# Patient Record
Sex: Female | Born: 1945 | Race: White | Hispanic: No | Smoking: Current every day smoker
Health system: Southern US, Community
[De-identification: ages and names within clinical notes are randomized; demographics above are authoritative.]

## PROBLEM LIST (undated history)

## (undated) DIAGNOSIS — C801 Malignant (primary) neoplasm, unspecified: Secondary | ICD-10-CM

## (undated) HISTORY — PX: ABDOMINAL HYSTERECTOMY: SHX81

## (undated) HISTORY — PX: BREAST BIOPSY: SHX20

---

## 2011-07-09 DIAGNOSIS — I10 Essential (primary) hypertension: Secondary | ICD-10-CM | POA: Insufficient documentation

## 2011-07-09 DIAGNOSIS — I2510 Atherosclerotic heart disease of native coronary artery without angina pectoris: Secondary | ICD-10-CM | POA: Insufficient documentation

## 2011-10-14 DIAGNOSIS — E785 Hyperlipidemia, unspecified: Secondary | ICD-10-CM | POA: Insufficient documentation

## 2012-09-10 DIAGNOSIS — M2040 Other hammer toe(s) (acquired), unspecified foot: Secondary | ICD-10-CM | POA: Insufficient documentation

## 2012-09-10 DIAGNOSIS — M2010 Hallux valgus (acquired), unspecified foot: Secondary | ICD-10-CM | POA: Insufficient documentation

## 2012-09-10 DIAGNOSIS — M19079 Primary osteoarthritis, unspecified ankle and foot: Secondary | ICD-10-CM | POA: Insufficient documentation

## 2014-03-08 DIAGNOSIS — R3129 Other microscopic hematuria: Secondary | ICD-10-CM | POA: Insufficient documentation

## 2015-01-02 ENCOUNTER — Emergency Department: Payer: Self-pay | Admitting: Emergency Medicine

## 2015-10-18 DIAGNOSIS — R918 Other nonspecific abnormal finding of lung field: Secondary | ICD-10-CM | POA: Insufficient documentation

## 2017-04-15 ENCOUNTER — Other Ambulatory Visit: Payer: Self-pay | Admitting: Family Medicine

## 2017-04-15 DIAGNOSIS — Z1231 Encounter for screening mammogram for malignant neoplasm of breast: Secondary | ICD-10-CM

## 2017-04-23 ENCOUNTER — Other Ambulatory Visit: Payer: Self-pay | Admitting: *Deleted

## 2017-04-23 ENCOUNTER — Encounter: Payer: Self-pay | Admitting: Radiology

## 2017-04-23 ENCOUNTER — Ambulatory Visit
Admission: RE | Admit: 2017-04-23 | Discharge: 2017-04-23 | Disposition: A | Discharge status: Home / Self Care | Payer: Medicare Other | Source: Ambulatory Visit | Attending: Family Medicine | Admitting: Family Medicine

## 2017-04-23 ENCOUNTER — Inpatient Hospital Stay
Admission: RE | Admit: 2017-04-23 | Discharge: 2017-04-23 | Disposition: A | Discharge status: Home / Self Care | Payer: Self-pay | Source: Ambulatory Visit | Attending: *Deleted | Admitting: *Deleted

## 2017-04-23 DIAGNOSIS — Z1231 Encounter for screening mammogram for malignant neoplasm of breast: Secondary | ICD-10-CM | POA: Diagnosis not present

## 2017-04-23 DIAGNOSIS — Z9289 Personal history of other medical treatment: Secondary | ICD-10-CM

## 2017-04-23 HISTORY — DX: Malignant (primary) neoplasm, unspecified: C80.1

## 2018-04-22 ENCOUNTER — Other Ambulatory Visit: Payer: Self-pay | Admitting: Family Medicine

## 2018-04-22 DIAGNOSIS — Z1231 Encounter for screening mammogram for malignant neoplasm of breast: Secondary | ICD-10-CM

## 2018-04-27 ENCOUNTER — Encounter (INDEPENDENT_AMBULATORY_CARE_PROVIDER_SITE_OTHER): Payer: Self-pay

## 2018-04-27 ENCOUNTER — Ambulatory Visit
Admission: RE | Admit: 2018-04-27 | Discharge: 2018-04-27 | Disposition: A | Discharge status: Home / Self Care | Payer: Medicare Other | Source: Ambulatory Visit | Attending: Family Medicine | Admitting: Family Medicine

## 2018-04-27 DIAGNOSIS — Z1231 Encounter for screening mammogram for malignant neoplasm of breast: Secondary | ICD-10-CM | POA: Diagnosis not present

## 2018-08-03 DIAGNOSIS — R7989 Other specified abnormal findings of blood chemistry: Secondary | ICD-10-CM | POA: Insufficient documentation

## 2018-08-11 ENCOUNTER — Other Ambulatory Visit: Payer: Self-pay | Admitting: Unknown Physician Specialty

## 2018-08-11 DIAGNOSIS — M545 Low back pain: Principal | ICD-10-CM

## 2018-08-11 DIAGNOSIS — G8929 Other chronic pain: Secondary | ICD-10-CM

## 2018-08-11 DIAGNOSIS — M5417 Radiculopathy, lumbosacral region: Secondary | ICD-10-CM | POA: Insufficient documentation

## 2018-08-11 DIAGNOSIS — M25552 Pain in left hip: Secondary | ICD-10-CM | POA: Insufficient documentation

## 2018-08-21 ENCOUNTER — Ambulatory Visit: Admission: RE | Admit: 2018-08-21 | Payer: Medicare Other | Source: Ambulatory Visit

## 2018-09-03 ENCOUNTER — Ambulatory Visit
Admission: RE | Admit: 2018-09-03 | Discharge: 2018-09-03 | Disposition: A | Discharge status: Home / Self Care | Payer: Medicare Other | Source: Ambulatory Visit | Attending: Unknown Physician Specialty | Admitting: Unknown Physician Specialty

## 2018-09-03 DIAGNOSIS — M48061 Spinal stenosis, lumbar region without neurogenic claudication: Secondary | ICD-10-CM | POA: Diagnosis not present

## 2018-09-03 DIAGNOSIS — M545 Low back pain: Secondary | ICD-10-CM

## 2018-09-03 DIAGNOSIS — M5126 Other intervertebral disc displacement, lumbar region: Secondary | ICD-10-CM | POA: Insufficient documentation

## 2018-09-03 DIAGNOSIS — M8938 Hypertrophy of bone, other site: Secondary | ICD-10-CM | POA: Insufficient documentation

## 2018-09-03 DIAGNOSIS — M4316 Spondylolisthesis, lumbar region: Secondary | ICD-10-CM | POA: Insufficient documentation

## 2018-09-03 DIAGNOSIS — G8929 Other chronic pain: Secondary | ICD-10-CM | POA: Diagnosis present

## 2018-09-03 DIAGNOSIS — M5417 Radiculopathy, lumbosacral region: Secondary | ICD-10-CM | POA: Diagnosis not present

## 2019-02-03 DIAGNOSIS — E538 Deficiency of other specified B group vitamins: Secondary | ICD-10-CM | POA: Insufficient documentation

## 2019-02-15 ENCOUNTER — Encounter: Payer: Self-pay | Admitting: *Deleted

## 2019-02-15 ENCOUNTER — Telehealth: Payer: Self-pay | Admitting: *Deleted

## 2019-02-15 DIAGNOSIS — Z122 Encounter for screening for malignant neoplasm of respiratory organs: Secondary | ICD-10-CM

## 2019-02-15 DIAGNOSIS — Z87891 Personal history of nicotine dependence: Secondary | ICD-10-CM

## 2019-02-15 NOTE — Telephone Encounter (Signed)
Received referral for initial lung cancer screening scan. Contacted patient and obtained smoking history,(current 55 pack year) as well as answering questions related to screening process. Patient denies signs of lung cancer such as weight loss or hemoptysis. Patient denies comorbidity that would prevent curative treatment if lung cancer were found. Patient is scheduled for shared decision making visit and CT scan on 03/04/19 at 2pm.

## 2019-03-01 ENCOUNTER — Telehealth: Payer: Self-pay | Admitting: *Deleted

## 2019-03-01 NOTE — Telephone Encounter (Signed)
Patient notified/message left to notify patient that due to current restrictions lung screening appointments are cancelled and patient will be contacted regarding rescheduling.  

## 2019-03-04 ENCOUNTER — Ambulatory Visit: Payer: Medicare Other | Admitting: Oncology

## 2019-03-04 ENCOUNTER — Ambulatory Visit: Payer: Medicare Other

## 2019-04-07 DIAGNOSIS — E559 Vitamin D deficiency, unspecified: Secondary | ICD-10-CM | POA: Insufficient documentation

## 2019-04-07 DIAGNOSIS — N1831 Chronic kidney disease, stage 3a: Secondary | ICD-10-CM | POA: Insufficient documentation

## 2019-04-28 ENCOUNTER — Telehealth: Payer: Self-pay | Admitting: *Deleted

## 2019-04-28 NOTE — Telephone Encounter (Signed)
Voicemail left in attempt to reschedule lung screening

## 2019-05-03 DIAGNOSIS — R002 Palpitations: Secondary | ICD-10-CM | POA: Insufficient documentation

## 2019-05-06 ENCOUNTER — Telehealth: Payer: Self-pay | Admitting: *Deleted

## 2019-05-06 DIAGNOSIS — Q254 Congenital malformation of aorta unspecified: Secondary | ICD-10-CM | POA: Insufficient documentation

## 2019-05-06 DIAGNOSIS — I517 Cardiomegaly: Secondary | ICD-10-CM | POA: Insufficient documentation

## 2019-05-06 NOTE — Telephone Encounter (Signed)
Voicemail left in attempt to reschedule lung screening scan.

## 2019-05-28 ENCOUNTER — Telehealth: Payer: Self-pay | Admitting: *Deleted

## 2019-05-28 NOTE — Telephone Encounter (Signed)
Received referral for low dose lung cancer screening CT scan. Message left at phone number listed in EMR for patient to call me back to facilitate scheduling scan.  

## 2019-06-21 ENCOUNTER — Encounter: Payer: Self-pay | Admitting: *Deleted

## 2019-11-25 NOTE — Progress Notes (Signed)
[facility]  [address], [address] [place], [place] [address] Phone: [phone]  Fax: [phone]   Clinic Day:  11/26/2019  Referring physician: Valera Castle, *  Chief Complaint: Morgan Stewart is a 73 y.o. female with thrombocytopenia who is referred in consultation by Dr Valera Castle for assessment and management.   HPI: The patient underwent gastric bypass surgery in 1982.  Surgery was complicated by her wound healing by secondary intention. She described taking months to heal.  She also notes a history of cervical cancer and hysterectomy.  She denies any history of transfusion, hepatitis or exposure to HIV disease.  She was was seen by Dr. Kym Groom on 11/19/2019.  Platelet count was 115,000.  She denies any bruising or bleeding.    CBCs followed: 07/23/2016: Hematocrit 36.0, hemoglobin 12.2, MCV 92, platelets 213,000, WBC 5800, ANC 3300.  10/24/2016: Hematocrit 40.2, hemoglobin 13.6, MCV 90, platelets 144,000, WBC 4500, ANC 2000.  04/30/2017: Hematocrit 41.0, hemoglobin 13.7, MCV 91, platelets 153,000, WBC 5500, ANC 3300.  08/20/2018: Hematocrit 40.6, hemoglobin 13.4, MCV 92, platelets 134,000, WBC 9500, ANC 6200.  B12 was 217. 05/05/2019: Hematocrit 43.5, hemoglobin 14.0, MCV 95, platelets 129,000, WBC 6200, ANC 3600.  B12 was 684. 11/17/2019: Hematocrit 39.2, hemoglobin 12.7, MCV 94, platelets 115,000, WBC 5900, ANC 2800.  B12 was 386.  CMP was normal on 05/05/2019.  TSH was 1.86 on 08/20/2018.  Hepatitis C was negative on 04/30/2017.  She is scheduled for a colonoscopy with Dr. Alice Reichert on 12/23/2019.  Symptomatically, she feels fine.  She denies any concerns or symptoms.  Her sister also had a low platelet count.  Her sister had many health problems; she has required hospitalization.  Her sister has rheumatoid arthritis. She believes her brother may have lupus.  In distant past, she had B12 injections, but is unsure when.  She  takes B12 oral supplement. She eats mainly what she wants.  She eats meats 2-3 times a week. She has cravings for birthday cake ice cream. She denies ice pica or restless legs. She hasn't had any new medication or herbal products in the past 2 years.  She denies drinking any quinine water.   Past Medical History:  Diagnosis Date  . Cancer [facility])    cervical    Past Surgical History:  Procedure Laterality Date  . ABDOMINAL HYSTERECTOMY    . BREAST BIOPSY Left    bx/clip-neg    Family History  Problem Relation Age of Onset  . Breast cancer Sister 73    Social History:  reports that she has been smoking cigarettes. She has a 55.00 pack-year smoking history. She has never used smokeless tobacco. No history on file for alcohol and drug. She has a dog named Mel Almond (border [employer]). She smokes 5-6 cigarettes a day. 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 The patient is alone today.  Allergies:  Allergies  Allergen Reactions  . Ace Inhibitors Hives, Other (See Comments) and Rash    Pt is not sure of reaction  Pt is not sure of reaction    . Doxycycline Other (See Comments)    Pt is not sure of reaction  Pt is not sure of reaction    . Codeine Nausea Only  . Orlistat Rash and Other (See Comments)    Pt is not sure of reaction  Pt is not sure of reaction    . Oxycodone Nausea And Vomiting  . Oxycodone-Acetaminophen Nausea Only  .  Sulfa Antibiotics Other (See Comments) and Rash    Lowering serum sodium levels Lowering serum sodium levels     Current Medications: Current Outpatient Medications  Medication Sig Dispense Refill  . albuterol (VENTOLIN HFA) 108 (90 Base) MCG/ACT inhaler Inhale into the lungs.    Marland Kitchen aspirin 81 MG EC tablet Take by mouth.    . carvedilol (COREG) 6.25 MG tablet Take by mouth.    . Cholecalciferol 50 MCG (2000 UT) CAPS Take by mouth.    . cyanocobalamin 1000 MCG tablet Take by mouth.    . Fluticasone-Salmeterol  (ADVAIR) 250-50 MCG/DOSE AEPB Inhale into the lungs.    . hydrALAZINE (APRESOLINE) 25 MG tablet Take by mouth.    Marland Kitchen ipratropium (ATROVENT) 0.06 % nasal spray Place into the nose.    . losartan (COZAAR) 100 MG tablet Take by mouth.    . Melatonin 1 MG TABS Take by mouth.    . Nebulizers (DEVILBISS DISPOSABLE NEBULIZER) MISC Use nebulizer every 6 hours as needed for wheezing.    . rosuvastatin (CRESTOR) 40 MG tablet Take by mouth.    . traZODone (DESYREL) 50 MG tablet Take 50-100 mg by mouth at bedtime as needed.    . triamcinolone cream (KENALOG) 0.1 % APP EXT AA BID    . venlafaxine XR (EFFEXOR-XR) 150 MG 24 hr capsule Take by mouth.    Marland Kitchen ibuprofen (ADVIL) 600 MG tablet Take by mouth.    . nitroGLYCERIN (NITROSTAT) 0.4 MG SL tablet Place under the tongue.     No current facility-administered medications for this visit.    Review of Systems  Constitutional: Negative.  Negative for chills, diaphoresis, fever, malaise/fatigue and weight loss.       Feels "wonderful".  HENT: Negative.  Negative for congestion, ear pain, nosebleeds, sinus pain and sore throat.   Eyes: Negative.  Negative for blurred vision, double vision and photophobia.  Respiratory: Positive for cough (intermittent; smoker). Negative for hemoptysis, sputum production, shortness of breath and wheezing.   Cardiovascular: Negative.  Negative for chest pain, palpitations, orthopnea, leg swelling and PND.  Gastrointestinal: Positive for constipation (takes Dulcolax). Negative for abdominal pain, blood in stool, diarrhea, melena, nausea and vomiting.  Genitourinary: Positive for frequency. Negative for dysuria, flank pain, hematuria and urgency.  Musculoskeletal: Negative.  Negative for back pain, joint pain, myalgias and neck pain.  Skin: Negative.  Negative for itching and rash.  Neurological: Negative.  Negative for dizziness, tingling, sensory change, speech change, focal weakness, seizures, weakness and headaches.    Endo/Heme/Allergies: Positive for environmental allergies. Bruises/bleeds easily (bruising on hands).  Psychiatric/Behavioral: Negative.  Negative for depression and memory loss. The patient is not nervous/anxious and does not have insomnia.    Performance status (ECOG):  0  Vitals Blood pressure 136/79, pulse 84, temperature 98.5 F (36.9 C), temperature source Oral, weight 177 lb 14.6 oz (80.7 kg), SpO2 98 %.   Physical Exam  Constitutional: She is oriented to person, place, and time. She appears well-developed and well-nourished. No distress.  HENT:  Head: Normocephalic and atraumatic.  Mouth/Throat: Oropharynx is clear and moist. No oropharyngeal exudate.  [employer].  Black mask.  Eyes: Pupils are equal, round, and reactive to light. Conjunctivae and EOM are normal. No scleral icterus.  Brown eyes.  Neck: No JVD present.  Cardiovascular: Normal rate, regular rhythm and normal heart sounds. Exam reveals no gallop and no friction rub.  No murmur heard. Pulmonary/Chest: Effort normal. No respiratory distress. She has wheezes. She has  no rales.  Intermittent expiratory wheezes and squeaks.  Abdominal: Soft. Bowel sounds are normal. She exhibits no distension and no mass. There is no abdominal tenderness. There is no rebound and no guarding.  Musculoskeletal:        General: No tenderness or edema. Normal range of motion.     Cervical back: Normal range of motion and neck supple.  Lymphadenopathy:       Head (right side): No preauricular, no posterior auricular and no occipital adenopathy present.       Head (left side): No preauricular, no posterior auricular and no occipital adenopathy present.    She has no cervical adenopathy.    She has no axillary adenopathy.       Right: No inguinal and no supraclavicular adenopathy present.       Left: No inguinal and no supraclavicular adenopathy present.  Neurological: She is alert and oriented to person, place, and time.  Skin:  Skin is warm and dry. No rash noted. She is not diaphoretic. No erythema. No pallor.  Ecchymosis dorsum of hands.  Psychiatric: She has a normal mood and affect. Her behavior is normal. Judgment and thought content normal.  Nursing note and vitals reviewed.   No visits with results within 3 Day(s) from this visit.  Latest known visit with results is:  No results found for any previous visit.    Assessment:  Asalee Symington is a 73 y.o. 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  Platelet count has ranged between 115,000 - 134,000 since 08/2018.  She denies any new medications or herbal products.  She does not drink quinine water.  She has never had a transfusion.   She is s/p gastric bypass surgery in 1982.  She has B12 deficiency.  B12 was 217 on 08/20/2018 and 386 on 11/17/2019.  She is on monthly B12 injections.  Symptomatically, she feels good.  She denies any fevers, sweats or weight loss.  Exam reveals intermittent wheezes and squeaks.   Plan: 1.   Labs today:  CBC with diff, platelet count in a blue top tube, PT, PTT, ANA with reflex, RF, folate, copper.  2.   Peripheral smear for path review. 3.   Mild thrombocytopenia  Etiology is unclear.  No medication or herbal product implicated.  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  She has a family history of rheumatoid arthritis and lupus. 4.   RTC in 2 weeks for MD assessment, review of work-up and discussion regarding direction of therapy.   I discussed the assessment and treatment plan with the patient.  The patient was provided an opportunity to ask questions and all were answered.  The patient agreed with the plan and demonstrated an understanding of the instructions.  The patient was advised to call back if the symptoms worsen or if the condition fails to improve as anticipated.  I provided 22 minutes (11:20 AM - 11:41 AM) of face-to-face time during this this encounter and > 50% was spent  counseling as documented under my assessment and plan.    Shannan Garfinkel C. Mike Gip, MD, PhD    11/26/2019, 11:41 AM  I, Samul Dada, am acting as scribe for [employer]. Mike Gip, MD, PhD.  I, Dayane Hillenburg C. Mike Gip, MD, have reviewed the above documentation for accuracy and completeness, and I agree with the above.

## 2019-11-26 ENCOUNTER — Other Ambulatory Visit: Payer: Self-pay

## 2019-11-26 ENCOUNTER — Inpatient Hospital Stay: Payer: Medicare Other | Attending: Hematology and Oncology | Admitting: Hematology and Oncology

## 2019-11-26 ENCOUNTER — Inpatient Hospital Stay: Payer: Medicare Other

## 2019-11-26 ENCOUNTER — Encounter: Payer: Self-pay | Admitting: Hematology and Oncology

## 2019-11-26 VITALS — BP 136/79 | HR 84 | Temp 98.5°F | Wt 177.9 lb

## 2019-11-26 DIAGNOSIS — Z90710 Acquired absence of both cervix and uterus: Secondary | ICD-10-CM | POA: Insufficient documentation

## 2019-11-26 DIAGNOSIS — Z791 Long term (current) use of non-steroidal anti-inflammatories (NSAID): Secondary | ICD-10-CM | POA: Diagnosis not present

## 2019-11-26 DIAGNOSIS — Z9884 Bariatric surgery status: Secondary | ICD-10-CM | POA: Diagnosis not present

## 2019-11-26 DIAGNOSIS — Z7951 Long term (current) use of inhaled steroids: Secondary | ICD-10-CM | POA: Insufficient documentation

## 2019-11-26 DIAGNOSIS — M109 Gout, unspecified: Secondary | ICD-10-CM | POA: Insufficient documentation

## 2019-11-26 DIAGNOSIS — F17210 Nicotine dependence, cigarettes, uncomplicated: Secondary | ICD-10-CM | POA: Insufficient documentation

## 2019-11-26 DIAGNOSIS — Z8541 Personal history of malignant neoplasm of cervix uteri: Secondary | ICD-10-CM | POA: Diagnosis not present

## 2019-11-26 DIAGNOSIS — D696 Thrombocytopenia, unspecified: Secondary | ICD-10-CM | POA: Insufficient documentation

## 2019-11-26 DIAGNOSIS — Z7982 Long term (current) use of aspirin: Secondary | ICD-10-CM | POA: Insufficient documentation

## 2019-11-26 DIAGNOSIS — R05 Cough: Secondary | ICD-10-CM | POA: Diagnosis not present

## 2019-11-26 DIAGNOSIS — Z79899 Other long term (current) drug therapy: Secondary | ICD-10-CM | POA: Insufficient documentation

## 2019-11-26 DIAGNOSIS — J449 Chronic obstructive pulmonary disease, unspecified: Secondary | ICD-10-CM | POA: Insufficient documentation

## 2019-11-26 LAB — CBC WITH DIFFERENTIAL/PLATELET
Abs Immature Granulocytes: 0.02 10*3/uL (ref 0.00–0.07)
Basophils Absolute: 0 10*3/uL (ref 0.0–0.1)
Basophils Relative: 1 %
Eosinophils Absolute: 0.2 10*3/uL (ref 0.0–0.5)
Eosinophils Relative: 3 %
HCT: 41.6 % (ref 36.0–46.0)
Hemoglobin: 13.2 g/dL (ref 12.0–15.0)
Immature Granulocytes: 0 %
Lymphocytes Relative: 34 %
Lymphs Abs: 2 10*3/uL (ref 0.7–4.0)
MCH: 30.1 pg (ref 26.0–34.0)
MCHC: 31.7 g/dL (ref 30.0–36.0)
MCV: 95 fL (ref 80.0–100.0)
Monocytes Absolute: 0.4 10*3/uL (ref 0.1–1.0)
Monocytes Relative: 6 %
Neutro Abs: 3.2 10*3/uL (ref 1.7–7.7)
Neutrophils Relative %: 56 %
Platelets: 114 10*3/uL — ABNORMAL LOW (ref 150–400)
RBC: 4.38 MIL/uL (ref 3.87–5.11)
RDW: 14 % (ref 11.5–15.5)
WBC: 5.7 10*3/uL (ref 4.0–10.5)
nRBC: 0 % (ref 0.0–0.2)

## 2019-11-26 LAB — PROTIME-INR
INR: 0.9 (ref 0.8–1.2)
Prothrombin Time: 12.4 seconds (ref 11.4–15.2)

## 2019-11-26 LAB — APTT: aPTT: 33 seconds (ref 24–36)

## 2019-11-26 LAB — FOLATE: Folate: 28 ng/mL (ref >5.9)

## 2019-11-26 LAB — PLATELET BY CITRATE: Platelet CT in Citrate: 106

## 2019-11-27 LAB — RHEUMATOID FACTOR: Rhuematoid fact SerPl-aCnc: <10 IU/mL (ref 0.0–13.9)

## 2019-11-27 LAB — ANA W/REFLEX: Anti Nuclear Antibody (ANA): NEGATIVE

## 2019-11-29 LAB — PATHOLOGIST SMEAR REVIEW

## 2019-12-01 LAB — COPPER, SERUM: Copper: 53 ug/dL — ABNORMAL LOW (ref 72–166)

## 2019-12-14 DIAGNOSIS — E610 Copper deficiency: Secondary | ICD-10-CM | POA: Insufficient documentation

## 2019-12-15 ENCOUNTER — Inpatient Hospital Stay (HOSPITAL_BASED_OUTPATIENT_CLINIC_OR_DEPARTMENT_OTHER): Payer: Medicare Other | Admitting: Hematology and Oncology

## 2019-12-15 ENCOUNTER — Encounter: Payer: Self-pay | Admitting: Hematology and Oncology

## 2019-12-15 DIAGNOSIS — D696 Thrombocytopenia, unspecified: Secondary | ICD-10-CM

## 2019-12-15 DIAGNOSIS — E610 Copper deficiency: Secondary | ICD-10-CM | POA: Diagnosis not present

## 2019-12-15 NOTE — Progress Notes (Signed)
No new changes noted today. The patient Name and DOB has been verified by phone today. 

## 2019-12-15 NOTE — Progress Notes (Signed)
[facility]  [address], [address] [place], [place] [address] Phone: [phone]  Fax: [phone]   Telephone Visit:  12/15/2019  Referring physician: Gayland Curry, MD  I connected with Morgan Stewart on 12/15/19 at 3:04 PM by telephone and verified that I was speaking with the correct person using 2 identifiers.  The patient was at home.  I discussed the limitations, risk, security and privacy concerns of performing an evaluation and management service by telephone and the availability of in person appointments.  I also discussed with the patient that there may be a patient responsible charge related to this service.  The patient expressed understanding and agreed to proceed.   Chief Complaint: Morgan Stewart is a 73 y.o. female with mild thrombocytopenia and B12 deficiency who is seen for review of work up and discussion of direction of therapy  HPI: The patient was last seen in the hematology clinic on 11/26/2019 for initial consultation. At that time, she feels good.  She denies any fevers, sweats or weight loss.  Exam reveals intermittent wheezes and squeaks.   Work up revealed a hematocrit 41.6, hemoglobin 13.2, MCV 95.0, platelets 114,000, WBC 5700 and ANC 3200.  Platelet count in a citrate tube was 106,000.  Folate was 28.0. ANA was negative. Rheumatoid factor was negative.  Copper was 53 (72-166).  PT/INR 12.4/0.9 and APTT 33.   Peripheral smear showed unremarkable WBC, RBC, and platelet morphology.  During the interim, she has been doing well.  She denies any bleeding.  Her hands bruise easily.  She wishes she could lose weight.  She does breathing treatments twice a day and she has albuterol that she uses BID.  She is not receiving B12 injections.  She takes vitamin E 400 mg IU and vitamin C 500 mg.   Past Medical History:  Diagnosis Date  . Cancer [facility])    cervical    Past Surgical History:  Procedure Laterality Date  . ABDOMINAL  HYSTERECTOMY    . BREAST BIOPSY Left    bx/clip-neg    Family History  Problem Relation Age of Onset  . Breast cancer Sister 91    Social History:  reports that she has been smoking cigarettes. She has a 55.00 pack-year smoking history. She has never used smokeless tobacco. No history on file for alcohol and drug.  She has a dog named Mel Almond (border collie cocker mix). She smokes 5-6 cigarettes a day. 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  The patient is alone today.  Participants in the patient's visit and their role in the encounter included the patient, Samul Dada, scribe, and [employer], CMA, today.  The intake visit was provided by Park Meo, and Vito Berger, CMA.   Allergies:  Allergies  Allergen Reactions  . Ace Inhibitors Hives, Other (See Comments) and Rash    Pt is not sure of reaction  Pt is not sure of reaction    . Doxycycline Other (See Comments)    Pt is not sure of reaction  Pt is not sure of reaction    . Codeine Nausea Only  . Orlistat Rash and Other (See Comments)    Pt is not sure of reaction  Pt is not sure of reaction    . Oxycodone Nausea And Vomiting  . Oxycodone-Acetaminophen Nausea Only  . Sulfa Antibiotics Other (See Comments) and Rash    Lowering serum sodium levels Lowering serum sodium levels     Current  Medications: Current Outpatient Medications  Medication Sig Dispense Refill  . aspirin 81 MG EC tablet Take by mouth.    . carvedilol (COREG) 6.25 MG tablet Take by mouth.    . Cholecalciferol 50 MCG (2000 UT) CAPS Take by mouth.    . cyanocobalamin 1000 MCG tablet Take by mouth.    . hydrALAZINE (APRESOLINE) 25 MG tablet Take 50 mg by mouth.     Marland Kitchen ipratropium (ATROVENT) 0.06 % nasal spray Place into the nose.    . losartan (COZAAR) 100 MG tablet Take by mouth.    . Melatonin 1 MG TABS Take by mouth.    . Nebulizers (DEVILBISS DISPOSABLE NEBULIZER) MISC Use nebulizer every 6  hours as needed for wheezing.    . rosuvastatin (CRESTOR) 40 MG tablet Take by mouth.    . traZODone (DESYREL) 50 MG tablet Take 50-100 mg by mouth at bedtime as needed.    . triamcinolone cream (KENALOG) 0.1 % APP EXT AA BID    . venlafaxine XR (EFFEXOR-XR) 150 MG 24 hr capsule Take by mouth.    Marland Kitchen albuterol (VENTOLIN HFA) 108 (90 Base) MCG/ACT inhaler Inhale into the lungs.    . Fluticasone-Salmeterol (ADVAIR) 250-50 MCG/DOSE AEPB Inhale into the lungs.    Marland Kitchen ibuprofen (ADVIL) 600 MG tablet Take by mouth.    . nitroGLYCERIN (NITROSTAT) 0.4 MG SL tablet Place under the tongue.     No current facility-administered medications for this visit.    Review of Systems  Constitutional: Negative.  Negative for chills, diaphoresis, fever, malaise/fatigue and weight loss.       Feels "good".  HENT: Negative.  Negative for congestion, ear pain, nosebleeds, sinus pain and sore throat.   Eyes: Negative.  Negative for blurred vision, double vision and photophobia.  Respiratory: Positive for cough (intermittent; smoker). Negative for hemoptysis, sputum production, shortness of breath and wheezing.        Uses nebulizer BID.  Cardiovascular: Negative.  Negative for chest pain, palpitations, orthopnea, leg swelling and PND.  Gastrointestinal: Positive for constipation (takes Dulcolax). Negative for abdominal pain, blood in stool, diarrhea, melena, nausea and vomiting.  Genitourinary: Positive for frequency. Negative for dysuria, flank pain, hematuria and urgency.  Musculoskeletal: Negative.  Negative for back pain, joint pain, myalgias and neck pain.  Skin: Negative.  Negative for itching and rash.  Neurological: Negative.  Negative for dizziness, tingling, sensory change, speech change, focal weakness, seizures, weakness and headaches.  Endo/Heme/Allergies: Positive for environmental allergies. Bruises/bleeds easily (bruising on hands).  Psychiatric/Behavioral: Negative.  Negative for depression and memory  loss. The patient is not nervous/anxious and does not have insomnia.    Performance status (ECOG): 0  Vitals There were no vitals taken for this visit.   Physical Exam  Constitutional: She is oriented to person, place, and time.  Neurological: She is alert and oriented to person, place, and time.  Psychiatric: She has a normal mood and affect. Her behavior is normal. Judgment and thought content normal.  Nursing note reviewed.   No visits with results within 3 Day(s) from this visit.  Latest known visit with results is:  Office Visit on 11/26/2019  Component Date Value Ref Range Status  . Rhuematoid fact SerPl-aCnc 11/26/2019 <10.0  0.0 - 13.9 IU/mL Final   Comment: (NOTE) Performed At: [facility] [place], [place] [id number] Rush Farmer MD [id number]   . Copper 11/26/2019 53* 72 - 166 ug/dL Final   Comment: (NOTE) This test was developed and its  performance characteristics determined by [facility]. It has not been cleared or approved by the Food and Drug Administration.                                Detection Limit = 5 **Effective December 20, 2019 the reference interval**  for [id number] Copper, Serum will be changing to:                     Age                   Female                  0 -  4 months           71 - 122           5 months -  6 months           36 - 131           7 months - 10 months           40 - 142          11 months -  5 years            28 - 51           6 years  - 10 years            23 - 51          11 years  - 74 years            62 - 74          16 years  - 34 years            2 - 121                    > 30 years            53 - 55                    Age                    Female                  0 -  4 months           38 - 122           5 months -  6 months           55 - 131           7 months - 10 months           64 - 142          11 months -                            5 years            23 - 152           6  years  - 10 years            55 - 89          11 years  - 27 years            29 -  128          16 years  - 18 years            35 - 146                    > 18 years            4 - 59 Performed At: [facility] [place], [place] [id number] Rush Farmer MD [id number]   . Folate 11/26/2019 28.0  >5.9 ng/mL Final   Performed at [facility], [address] [place], [place] [address]  . Anti Nuclear Antibody (ANA) 11/26/2019 Negative  Negative Final   Comment: (NOTE) Performed At: [facility] [place], [place] [id number] Rush Farmer MD [id number]   . aPTT 11/26/2019 33  24 - 36 seconds Final   Performed at [facility], [address] [place], [place] [address]  . Prothrombin Time 11/26/2019 12.4  11.4 - 15.2 seconds Final  . INR 11/26/2019 0.9  0.8 - 1.2 Final   Comment: (NOTE) INR goal varies based on device and disease states. Performed at [facility], [address] [place], [place] [address]   . Platelet CT in [place] 11/26/2019 106   Final   Performed at [facility], [address] [place], [place] [address]  . WBC 11/26/2019 5.7  4.0 - 10.5 K/uL Final  . RBC 11/26/2019 4.38  3.87 - 5.11 MIL/uL Final  . Hemoglobin 11/26/2019 13.2  12.0 - 15.0 g/dL Final  . HCT 11/26/2019 41.6  36.0 - 46.0 % Final  . MCV 11/26/2019 95.0  80.0 - 100.0 fL Final  . MCH 11/26/2019 30.1  26.0 - 34.0 pg Final  . MCHC 11/26/2019 31.7  30.0 - 36.0 g/dL Final  . RDW 11/26/2019 14.0  11.5 - 15.5 % Final  . Platelets 11/26/2019 114* 150 - 400 K/uL Final   Comment: Immature Platelet Fraction may be clinically indicated, consider ordering this additional test [id number]   . nRBC 11/26/2019 0.0  0.0 - 0.2 % Final  . Neutrophils Relative % 11/26/2019 56  % Final  . Neutro Abs 11/26/2019 3.2  1.7 - 7.7 K/uL Final  . Lymphocytes Relative 11/26/2019 34  % Final  . Lymphs Abs 11/26/2019 2.0  0.7 - 4.0  K/uL Final  . Monocytes Relative 11/26/2019 6  % Final  . Monocytes Absolute 11/26/2019 0.4  0.1 - 1.0 K/uL Final  . Eosinophils Relative 11/26/2019 3  % Final  . Eosinophils Absolute 11/26/2019 0.2  0.0 - 0.5 K/uL Final  . Basophils Relative 11/26/2019 1  % Final  . Basophils Absolute 11/26/2019 0.0  0.0 - 0.1 K/uL Final  . Immature Granulocytes 11/26/2019 0  % Final  . Abs Immature Granulocytes 11/26/2019 0.02  0.00 - 0.07 K/uL Final   Performed at [facility], [address] [place], [place] [address]  . Path Review 11/26/2019 Blood smear is reviewed.   Final   Comment: Patient undergoing evaluation for persistent thrombocytopenia. Unremarkable WBC, RBC, and platelet morphology. Reviewed by Dellia Nims Reuel Derby, M.D. Performed at [facility], [address], [place], [place] [address]     Assessment:  Morgan Stewart is a 73 y.o. 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  Platelet count has ranged between 115,000 - 134,000 since 08/2018.  She denies any new medications or herbal products.  She does not drink quinine water.  She has never had a transfusion.  She is s/p gastric bypass surgery in 1982.  She has B12 deficiency.  B12 was 217 on 08/20/2018 and 386 on 11/17/2019.  She is on monthly B12 injections (not currently).  Work up on 11/26/2019 revealed a hematocrit 41.6, hemoglobin 13.2, MCV 95.0, platelets 114,000, WBC 5700 and ANC 3200.  Platelet count in a citrate tube was 106,000.  Folate was 28.0. ANA and rheumatoid factor were negative.  Copper was 53 (72-166).  PT/INR 12.4/0.9 and APTT 33.   Symptomatically, she feels "good".  She denies any bleeding.  Her hands bruise easily.  Platelet count was 114,000.  Plan: 1.   Mild thrombocytopenia             Review work-up.  Etiology unclear, bit possibly related to low copper level.  No evidence of pseudothrombocytopenia as platelet count in a blue top tube was normal.             No medication or herbal product  implicated.             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             Discuss plan to repeat platelet count on normalization of copper level.  Consider future check of hepatitis serologies. 2.   Mild copper deficiency  Copper deficiency typically causes anemia and leukopenia and rarely thrombocytopenia.  Discuss supplementation with MVI with 100% RDA copper. 3.   RTC in 1 month for labs (CBC with diff, B12, copper). 4.   RTC in 3 months for MD assess and labs (CBC with diff +/- others).  I discussed the assessment and treatment plan with the patient.  The patient was provided an opportunity to ask questions and all were answered.  The patient agreed with the plan and demonstrated an understanding of the instructions.  The patient was advised to call back if the symptoms worsen or if the condition fails to improve as anticipated.  I provided 12 minutes (3:02 PM - 3:14 PM) of non face-to-face time during this this encounter and > 50% was spent counseling as documented under my assessment and plan.    Lequita Asal, MD, PhD    12/15/2019, 3:14 PM  I, Samul Dada, am acting as a scribe for Lequita Asal, MD.  I, Prosser Mike Gip, MD, have reviewed the above documentation for accuracy and completeness, and I agree with the above.

## 2019-12-21 ENCOUNTER — Other Ambulatory Visit: Payer: Commercial Managed Care - PPO | Attending: Internal Medicine

## 2019-12-23 ENCOUNTER — Encounter: Admission: RE | Payer: Self-pay | Source: Home / Self Care

## 2019-12-23 ENCOUNTER — Ambulatory Visit
Admission: RE | Admit: 2019-12-23 | Payer: Commercial Managed Care - PPO | Source: Home / Self Care | Admitting: Internal Medicine

## 2019-12-23 SURGERY — COLONOSCOPY WITH PROPOFOL
Anesthesia: General

## 2020-01-03 ENCOUNTER — Telehealth: Payer: Self-pay

## 2020-01-03 NOTE — Telephone Encounter (Signed)
Attempted to return patients call regarding lab results. VM left requesting callback.

## 2020-01-03 NOTE — Telephone Encounter (Signed)
-----   Message from Secundino Ginger sent at 01/03/2020  2:22 PM EST ----- Regarding: LABS THIS PATIENT CALLED AND LEFT A MESSAGE WANTED A CALL BACK ON HER LAB RESULTS.

## 2020-01-17 ENCOUNTER — Inpatient Hospital Stay: Payer: Commercial Managed Care - PPO | Attending: Hematology and Oncology

## 2020-02-10 DIAGNOSIS — Z1211 Encounter for screening for malignant neoplasm of colon: Secondary | ICD-10-CM | POA: Insufficient documentation

## 2020-03-14 ENCOUNTER — Inpatient Hospital Stay: Payer: Commercial Managed Care - PPO

## 2020-03-14 ENCOUNTER — Inpatient Hospital Stay: Payer: Commercial Managed Care - PPO | Admitting: Hematology and Oncology

## 2020-03-20 NOTE — Progress Notes (Signed)
[facility]  [address] [place], [place] [address] Phone: [phone]  Fax: [phone]   Clinic Day:  03/21/2020  Referring physician: Gayland Curry, MD  Chief Complaint: Morgan Stewart is a 74 y.o. female with mild thrombocytopenia and B12 deficiency who is seen for 4 month assessment.   HPI: The patient was last seen in the hematology clinic on 12/15/2019 via telephone. At that time, she felt "good". She denied any bleeding. Her hands bruise easily. Platelet count was 114,000.  Copper was 53 (72-166).  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   During the interim, the patient has felt good. She notes some constipation. She was taking Dulcolax without success so she will start another stool softener OTC. She has a decrease in her appetite. She reports not being active due to COVID-19 and left hip pain (old). Left hip pain increased with mobility and long distance walking. When standing, she leans more to the right side. She denies any bruising or bleeding. She does not drink any quinine water. Denies being on herbal products. Her cough is better. She is using her nebulizer BID. She continues to smoke 8 cigarettes a day. Smoking cessation encouraged. She is taking vitamins daily. I advised her to make sure she is taking copper with the multivitamins.   Patient agreed to come to the clinic for monthly B12 injections. Dr. Kym Groom is her new PCP since Dr. Astrid Divine left. She recently had her last COVID-19 vaccine done at [facility].She agreed to the low dose chest CT lung cancer screening program.    Past Medical History:  Diagnosis Date  . 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 ABDOMINAL HYSTERECTOMY    . BREAST BIOPSY Left    bx/clip-neg    Family History  Problem Relation Age of Onset  . Breast cancer Sister 48    Social History:  reports that she  has been smoking cigarettes. She has a 55.00 pack-year smoking history. She has never used smokeless tobacco. No history on file for alcohol and drug. She has a dog namedBailey(border collie cocker mix). She smokes 5-6 cigarettes a day. She retired from [employer]. She lives home alone. The patient is alone today.  Allergies:  Allergies  Allergen Reactions  . Ace Inhibitors Hives, Other (See Comments) and Rash    Pt is not sure of reaction  Pt is not sure of reaction    . Doxycycline Other (See Comments)    Pt is not sure of reaction  Pt is not sure of reaction    . Codeine Nausea Only  . Orlistat Rash and Other (See Comments)    Pt is not sure of reaction  Pt is not sure of reaction    . Oxycodone Nausea And Vomiting  . Oxycodone-Acetaminophen Nausea Only  . Sulfa Antibiotics Other (See Comments), Rash and Hives    Lowering serum sodium levels Lowering serum sodium levels  Lowering serum sodium levels Lowering serum sodium levels    Current Medications: Current Outpatient Medications  Medication Sig Dispense Refill  . albuterol (VENTOLIN HFA) 108 (90 Base) MCG/ACT inhaler Inhale into the lungs.    Marland Kitchen aspirin 81 MG EC tablet Take by mouth.    . carvedilol (COREG) 6.25 MG tablet Take by mouth.    . Cholecalciferol 50 MCG (2000 UT) CAPS Take by mouth.    . hydrALAZINE (APRESOLINE)  25 MG tablet Take 50 mg by mouth.     Marland Kitchen ibuprofen (ADVIL) 600 MG tablet Take by mouth.    Marland Kitchen ipratropium (ATROVENT) 0.06 % nasal spray Place into the nose.    . losartan (COZAAR) 100 MG tablet Take by mouth.    . Melatonin 1 MG TABS Take by mouth.    . Nebulizers (DEVILBISS DISPOSABLE NEBULIZER) MISC Use nebulizer every 6 hours as needed for wheezing.    . rosuvastatin (CRESTOR) 40 MG tablet Take by mouth.    . traZODone (DESYREL) 50 MG tablet Take 50-100 mg by mouth at bedtime as needed.    . venlafaxine XR (EFFEXOR-XR) 150 MG 24 hr capsule Take by mouth.    .  Fluticasone-Salmeterol (ADVAIR) 250-50 MCG/DOSE AEPB Inhale into the lungs.    . nitroGLYCERIN (NITROSTAT) 0.4 MG SL tablet Place under the tongue.    . triamcinolone cream (KENALOG) 0.1 % APP EXT AA BID     No current facility-administered medications for this visit.    Review of Systems  Constitutional: Negative.  Negative for chills, diaphoresis, fever, malaise/fatigue and weight loss (up 2 lbs).       Feels good.  HENT: Negative.  Negative for congestion, ear pain, nosebleeds, sinus pain and sore throat.   Eyes: Negative.  Negative for blurred vision, double vision and photophobia.  Respiratory: Negative for cough (intermittent; smoker; better), hemoptysis, sputum production, shortness of breath and wheezing.        Uses nebulizer BID.  Cardiovascular: Negative.  Negative for chest pain, palpitations, orthopnea, leg swelling and PND.  Gastrointestinal: Positive for constipation. Negative for abdominal pain, blood in stool, diarrhea, melena, nausea and vomiting.       Decreased appetite.  Genitourinary: Negative for dysuria, flank pain, frequency, hematuria and urgency.  Musculoskeletal: Positive for joint pain (left hip; pain increases with long distance walking). Negative for back pain, myalgias and neck pain.  Skin: Negative.  Negative for itching and rash.  Neurological: Negative.  Negative for dizziness, tingling, sensory change, speech change, focal weakness, seizures, weakness and headaches.  Endo/Heme/Allergies: Negative for environmental allergies. Does not bruise/bleed easily.  Psychiatric/Behavioral: Negative.  Negative for depression and memory loss. The patient is not nervous/anxious and does not have insomnia.   All other systems reviewed and are negative.  Performance status (ECOG): 1-2  Vitals Blood pressure 136/69, pulse 65, temperature (!) 97 F (36.1 C), temperature source Tympanic, resp. rate 18, weight 179 lb 12.8 oz (81.6 kg), SpO2 97 %.   Physical Exam Vitals  and nursing note reviewed.  Constitutional:      General: She is not in acute distress.    Appearance: She is well-developed and well-nourished. She is not diaphoretic.     Comments: Needs minimal assistance on/off exam room table. Ambulating with wheelchair.  HENT:     Head: Normocephalic and atraumatic.     Mouth/Throat:     Mouth: Oropharynx is clear and moist.     Pharynx: No oropharyngeal exudate.      Comments: Curly brown/red/blonde styled hair. Mask. Eyes:     General: No scleral icterus.    Extraocular Movements: EOM normal.     Conjunctiva/sclera: Conjunctivae normal.     Pupils: Pupils are equal, round, and reactive to light.  Cardiovascular:     Rate and Rhythm: Normal rate and regular rhythm.     Heart sounds: Normal heart sounds. No murmur heard.   Pulmonary:     Effort: Pulmonary effort is normal. No  respiratory distress.     Breath sounds: Wheezing present. No rales.  Chest:     Chest wall: No tenderness.  Abdominal:     General: Bowel sounds are normal. There is no distension.     Palpations: Abdomen is soft.     Tenderness: There is no abdominal tenderness. There is no guarding or rebound.  Musculoskeletal:        General: No tenderness or edema. Normal range of motion.     Cervical back: Normal range of motion and neck supple.  Lymphadenopathy:     Head:     Right side of head: No preauricular, posterior auricular or occipital adenopathy.     Left side of head: No preauricular, posterior auricular or occipital adenopathy.     Cervical: No cervical adenopathy.     Upper Body:  No axillary adenopathy present.    Right upper body: No supraclavicular adenopathy.     Left upper body: No supraclavicular adenopathy.     Lower Body: No right inguinal adenopathy. No left inguinal adenopathy.  Skin:    General: Skin is warm and dry.  Neurological:     Mental Status: She is alert and oriented to person, place, and time.  Psychiatric:        Mood and Affect:  Mood and affect normal.        Behavior: Behavior normal.        Thought Content: Thought content normal.        Judgment: Judgment normal.    Appointment on 03/21/2020  Component Date Value Ref Range Status  . WBC 03/21/2020 7.6  4.0 - 10.5 K/uL Final  . RBC 03/21/2020 4.72  3.87 - 5.11 MIL/uL Final  . Hemoglobin 03/21/2020 14.1  12.0 - 15.0 g/dL Final  . HCT 03/21/2020 44.7  36.0 - 46.0 % Final  . MCV 03/21/2020 94.7  80.0 - 100.0 fL Final  . MCH 03/21/2020 29.9  26.0 - 34.0 pg Final  . MCHC 03/21/2020 31.5  30.0 - 36.0 g/dL Final  . RDW 03/21/2020 14.3  11.5 - 15.5 % Final  . Platelets 03/21/2020 110* 150 - 400 K/uL Final   Comment: Immature Platelet Fraction may be clinically indicated, consider ordering this additional test [id number]   . nRBC 03/21/2020 0.0  0.0 - 0.2 % Final  . Neutrophils Relative % 03/21/2020 64  % Final  . Neutro Abs 03/21/2020 4.9  1.7 - 7.7 K/uL Final  . Lymphocytes Relative 03/21/2020 24  % Final  . Lymphs Abs 03/21/2020 1.9  0.7 - 4.0 K/uL Final  . Monocytes Relative 03/21/2020 7  % Final  . Monocytes Absolute 03/21/2020 0.5  0.1 - 1.0 K/uL Final  . Eosinophils Relative 03/21/2020 4  % Final  . Eosinophils Absolute 03/21/2020 0.3  0.0 - 0.5 K/uL Final  . Basophils Relative 03/21/2020 1  % Final  . Basophils Absolute 03/21/2020 0.0  0.0 - 0.1 K/uL Final  . Immature Granulocytes 03/21/2020 0  % Final  . Abs Immature Granulocytes 03/21/2020 0.02  0.00 - 0.07 K/uL Final   Performed at [facility], [address] [place], [place] [address]    Assessment:  Morgan Stewart is a 74 y.o. 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 Platelet count has ranged between 115,000 - 134,000 since 08/2018. She denies any new medications or herbal products. She does not drink quinine water. She has never had a transfusion.   She is s/p gastric bypass surgeryin 1982. She has B12 deficiency. B12  was 217 on 08/20/2018 and 386 on 11/17/2019. She is on  monthly B12 injections (not currently).  Work up on 11/26/2019 revealed a hematocrit 41.6, hemoglobin 13.2, MCV 95.0, platelets 114,000, WBC 5700 and ANC 3200.  Platelet count in a citrate tube was 106,000.  Folate was 28.0. ANA and rheumatoid factor were negative.  Copper was 53 (72-166).  PT/INR 12.4/0.9 and APTT 33.   She recently had her last COVID-19 vaccine.  Symptomatically, she feels good.  She denies any bruising or bleeding.  Exam reveals no adenopathy or hepatosplenomegaly.  Plan: 1.  Labs today: CBC with diff, B12, copper, ferritin. 2.  Mild thrombocytopenia Platelets 110,000.  Etiology is unclear as copper level is now normal.             No evidence of pseudothrombocytopenia as platelet count in a blue top tube was normal. No medication or herbal product implicated. 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  Consider additional work-up if platelet count drops (hepatitis serologies, H. pylori antibody, abdominal ultrasound). 3.Mild copper deficiency             Copper was 53 (low) on 11/26/2019 and 119 (normal) on 03/21/2020.  Copper deficiency typically causes anemia and leukopenia and rarely thrombocytopenia.             She is on a multivitamin with 100% RDA of copper. 4.   B12 deficiency   B12 today is 234.    B12 goal is 400.   5.  Smoking history   Patient has a 55-pack-year smoking history.    Discuss low-dose chest CT program.   Patient is interested. 6.   Status post gastric bypass surgery   Check iron stores. 7.   RN to contact [facility] about low dose chest CT program. 8.   RTC in 6 months for labs (CBC with diff, ferritin, folate). 9.   RTC in 1 year for MD assessment, labs (CBC with diff, ferritin, iron studies).  Addendum: RN to contact patient regarding low B12 level and initiation of oral B12.  I discussed the assessment and treatment plan with the patient.   The patient was provided an opportunity to ask questions and all were answered.  The patient agreed with the plan and demonstrated an understanding of the instructions.  The patient was advised to call back if the symptoms worsen or if the condition fails to improve as anticipated.  I provided 19 minutes of face-to-face time during this encounter and > 50% was spent counseling as documented under my assessment and plan.    Lequita Asal, MD, PhD    03/21/2020, 11:26 AM  I, Selena Batten, am acting as scribe for [employer]. Mike Gip, MD, PhD.  I, Melissa C. Mike Gip, MD, have reviewed the above documentation for accuracy and completeness, and I agree with the above.

## 2020-03-21 ENCOUNTER — Inpatient Hospital Stay: Payer: Commercial Managed Care - PPO | Attending: Hematology and Oncology

## 2020-03-21 ENCOUNTER — Other Ambulatory Visit: Payer: Self-pay

## 2020-03-21 ENCOUNTER — Encounter: Payer: Self-pay | Admitting: Hematology and Oncology

## 2020-03-21 ENCOUNTER — Inpatient Hospital Stay (HOSPITAL_BASED_OUTPATIENT_CLINIC_OR_DEPARTMENT_OTHER): Payer: Commercial Managed Care - PPO | Admitting: Hematology and Oncology

## 2020-03-21 VITALS — BP 136/69 | HR 65 | Temp 97.0°F | Resp 18 | Wt 179.8 lb

## 2020-03-21 DIAGNOSIS — D696 Thrombocytopenia, unspecified: Secondary | ICD-10-CM | POA: Diagnosis present

## 2020-03-21 DIAGNOSIS — Z9884 Bariatric surgery status: Secondary | ICD-10-CM | POA: Diagnosis not present

## 2020-03-21 DIAGNOSIS — E538 Deficiency of other specified B group vitamins: Secondary | ICD-10-CM | POA: Insufficient documentation

## 2020-03-21 DIAGNOSIS — E610 Copper deficiency: Secondary | ICD-10-CM

## 2020-03-21 LAB — CBC WITH DIFFERENTIAL/PLATELET
Abs Immature Granulocytes: 0.02 10*3/uL (ref 0.00–0.07)
Basophils Absolute: 0 10*3/uL (ref 0.0–0.1)
Basophils Relative: 1 %
Eosinophils Absolute: 0.3 10*3/uL (ref 0.0–0.5)
Eosinophils Relative: 4 %
HCT: 44.7 % (ref 36.0–46.0)
Hemoglobin: 14.1 g/dL (ref 12.0–15.0)
Immature Granulocytes: 0 %
Lymphocytes Relative: 24 %
Lymphs Abs: 1.9 10*3/uL (ref 0.7–4.0)
MCH: 29.9 pg (ref 26.0–34.0)
MCHC: 31.5 g/dL (ref 30.0–36.0)
MCV: 94.7 fL (ref 80.0–100.0)
Monocytes Absolute: 0.5 10*3/uL (ref 0.1–1.0)
Monocytes Relative: 7 %
Neutro Abs: 4.9 10*3/uL (ref 1.7–7.7)
Neutrophils Relative %: 64 %
Platelets: 110 10*3/uL — ABNORMAL LOW (ref 150–400)
RBC: 4.72 MIL/uL (ref 3.87–5.11)
RDW: 14.3 % (ref 11.5–15.5)
WBC: 7.6 10*3/uL (ref 4.0–10.5)
nRBC: 0 % (ref 0.0–0.2)

## 2020-03-21 LAB — VITAMIN B12: Vitamin B-12: 234 pg/mL (ref 180–914)

## 2020-03-21 LAB — FERRITIN: Ferritin: 78 ng/mL (ref 11–307)

## 2020-03-21 NOTE — Patient Instructions (Signed)
  Please call back and let us know about your multi-vitamin and if it has copper and how much.

## 2020-03-22 ENCOUNTER — Telehealth: Payer: Self-pay

## 2020-03-22 ENCOUNTER — Other Ambulatory Visit: Payer: Self-pay | Admitting: Hematology and Oncology

## 2020-03-22 NOTE — Telephone Encounter (Signed)
Called patient to discuss B12. Patient did not answer and does not have voicemail set up. Will try back later.

## 2020-03-23 ENCOUNTER — Telehealth: Payer: Self-pay | Admitting: *Deleted

## 2020-03-23 DIAGNOSIS — Z87891 Personal history of nicotine dependence: Secondary | ICD-10-CM

## 2020-03-23 NOTE — Telephone Encounter (Signed)
Received referral for initial lung cancer screening scan. Contacted patient and obtained smoking history,(current, 55 pack year) as well as answering questions related to screening process. Patient denies signs of lung cancer such as weight loss or hemoptysis. Patient denies comorbidity that would prevent curative treatment if lung cancer were found. Patient is scheduled for shared decision making visit and CT scan on 03/29/20 at 1030am.

## 2020-03-24 LAB — COPPER, SERUM: Copper: 119 ug/dL (ref 80–158)

## 2020-03-27 ENCOUNTER — Ambulatory Visit: Payer: Commercial Managed Care - PPO

## 2020-03-29 ENCOUNTER — Inpatient Hospital Stay (HOSPITAL_BASED_OUTPATIENT_CLINIC_OR_DEPARTMENT_OTHER): Payer: Commercial Managed Care - PPO | Admitting: Oncology

## 2020-03-29 ENCOUNTER — Ambulatory Visit: Payer: Medicare HMO | Attending: Oncology

## 2020-03-29 DIAGNOSIS — Z87891 Personal history of nicotine dependence: Secondary | ICD-10-CM

## 2020-03-29 NOTE — Progress Notes (Addendum)
This encounter was created in error - please disregard.

## 2020-03-29 NOTE — Addendum Note (Signed)
Addended by: Faythe Casa E on: 03/29/2020 11:36 AM   Modules accepted: Level of Service, SmartSet

## 2020-04-24 ENCOUNTER — Inpatient Hospital Stay: Payer: Commercial Managed Care - PPO | Attending: Hematology and Oncology

## 2020-05-03 ENCOUNTER — Encounter: Payer: Self-pay | Admitting: *Deleted

## 2020-05-04 ENCOUNTER — Encounter: Payer: Self-pay | Admitting: *Deleted

## 2020-05-04 NOTE — Telephone Encounter (Signed)
Rescheduled for 05/10/20 at 215pm

## 2020-05-10 ENCOUNTER — Ambulatory Visit: Payer: Medicare HMO

## 2020-05-10 ENCOUNTER — Inpatient Hospital Stay: Payer: Commercial Managed Care - PPO | Admitting: Oncology

## 2020-05-22 ENCOUNTER — Inpatient Hospital Stay: Payer: Medicare HMO | Attending: Hematology and Oncology

## 2020-05-22 DIAGNOSIS — E538 Deficiency of other specified B group vitamins: Secondary | ICD-10-CM | POA: Insufficient documentation

## 2020-05-25 ENCOUNTER — Other Ambulatory Visit: Payer: Self-pay

## 2020-05-25 ENCOUNTER — Inpatient Hospital Stay: Payer: Medicare HMO

## 2020-05-25 VITALS — BP 121/81 | HR 68 | Resp 18

## 2020-05-25 DIAGNOSIS — E538 Deficiency of other specified B group vitamins: Secondary | ICD-10-CM

## 2020-05-25 MED ORDER — CYANOCOBALAMIN 1000 MCG/ML IJ SOLN
1000.0000 ug | Freq: Once | INTRAMUSCULAR | Status: AC
  Administered 2020-05-25: 1000 ug via INTRAMUSCULAR
  Filled 2020-05-25: qty 1

## 2020-05-31 ENCOUNTER — Ambulatory Visit: Payer: Medicare HMO

## 2020-05-31 ENCOUNTER — Inpatient Hospital Stay: Payer: Commercial Managed Care - PPO | Admitting: Oncology

## 2020-06-10 DIAGNOSIS — Z9884 Bariatric surgery status: Secondary | ICD-10-CM | POA: Insufficient documentation

## 2020-06-20 ENCOUNTER — Ambulatory Visit
Admission: RE | Admit: 2020-06-20 | Discharge: 2020-06-20 | Disposition: A | Discharge status: Home / Self Care | Payer: Medicare HMO | Source: Ambulatory Visit | Attending: Oncology | Admitting: Oncology

## 2020-06-20 ENCOUNTER — Inpatient Hospital Stay: Payer: Commercial Managed Care - PPO | Attending: Hematology and Oncology

## 2020-06-20 ENCOUNTER — Other Ambulatory Visit: Payer: Self-pay

## 2020-06-20 ENCOUNTER — Inpatient Hospital Stay: Payer: Commercial Managed Care - PPO | Attending: Oncology | Admitting: Hospice and Palliative Medicine

## 2020-06-20 DIAGNOSIS — Z87891 Personal history of nicotine dependence: Secondary | ICD-10-CM | POA: Diagnosis present

## 2020-06-20 DIAGNOSIS — Z122 Encounter for screening for malignant neoplasm of respiratory organs: Secondary | ICD-10-CM

## 2020-06-20 DIAGNOSIS — F17210 Nicotine dependence, cigarettes, uncomplicated: Secondary | ICD-10-CM

## 2020-06-20 NOTE — Progress Notes (Signed)
Virtual Visit via Video Note  I connected with Morgan Stewart on 06/20/20 at  1:15 PM EDT by a video enabled telemedicine application and verified that I am speaking with the correct person using two identifiers.  Location: Patient: [facility] Provider:Clinic    I discussed the limitations of evaluation and management by telemedicine and the availability of in person appointments. The patient expressed understanding and agreed to proceed.  I discussed the assessment and treatment plan with the patient. The patient was provided an opportunity to ask questions and all were answered. The patient agreed with the plan and demonstrated an understanding of the instructions.   The patient was advised to call back or seek an in-person evaluation if the symptoms worsen or if the condition fails to improve as anticipated.   In accordance with CMS guidelines, patient has met eligibility criteria including age, absence of signs or symptoms of lung cancer.  Social History   Tobacco Use  . Smoking status: Current Every Day Smoker    Packs/day: 1.00    Years: 55.00    Pack years: 55.00    Types: Cigarettes  . Smokeless tobacco: Never Used  . Tobacco comment: .5ppd currently  Vaping Use  . Vaping Use: Never used  Substance Use Topics  . Alcohol use: Not on file  . Drug use: Not on file      A shared decision-making session was conducted prior to the performance of CT scan. This includes one or more decision aids, includes benefits and harms of screening, follow-up diagnostic testing, over-diagnosis, false positive rate, and total radiation exposure.   Counseling on the importance of adherence to annual lung cancer LDCT screening, impact of co-morbidities, and ability or willingness to undergo diagnosis and treatment is imperative for compliance of the program.   Counseling on the importance of continued smoking cessation for former smokers; the importance of smoking cessation for current smokers, and  information about tobacco cessation interventions have been given to patient including [place] [facility]uit [facility] and 1800 quit [place] programs.   Written order for lung cancer screening with LDCT has been given to the patient and any and all questions have been answered to the best of my abilities.    Yearly follow up will be coordinated by Shawn Perkins, Thoracic Navigator.  I provided 15 minutes of face-to-face video visit time during this encounter, and > 50% was spent counseling as documented under my assessment & plan.    E , NP  

## 2020-06-21 ENCOUNTER — Telehealth: Payer: Self-pay | Admitting: *Deleted

## 2020-06-21 NOTE — Telephone Encounter (Signed)
Notified patient of LDCT lung cancer screening program results with recommendation for 12 month follow up imaging. Also notified of incidental findings noted below and is encouraged to discuss further with PCP who will receive a copy of this note and/or the CT report. Patient verbalizes understanding.   IMPRESSION: 1. Lung-RADS 2, benign appearance or behavior. Continue annual screening with low-dose chest CT without contrast in 12 months. 2. Bronchial wall thickening with bilateral lower lobe small airway impaction and associated foci of atelectasis. Underlying component of atypical infection not excluded. 3. 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 Attention on follow-up annual lung cancer screening CT recommended. 4. Cholelithiasis. 5. Aortic Atherosclerosis (ICD10-I70.0) and Emphysema (ICD10-J43.9).

## 2020-07-17 ENCOUNTER — Inpatient Hospital Stay: Payer: Commercial Managed Care - PPO | Admitting: Hematology and Oncology

## 2020-07-17 ENCOUNTER — Inpatient Hospital Stay: Payer: Commercial Managed Care - PPO | Attending: Hematology and Oncology

## 2020-07-17 ENCOUNTER — Other Ambulatory Visit: Payer: Self-pay | Admitting: Hematology and Oncology

## 2020-07-17 DIAGNOSIS — Z9884 Bariatric surgery status: Secondary | ICD-10-CM

## 2020-07-17 DIAGNOSIS — D696 Thrombocytopenia, unspecified: Secondary | ICD-10-CM

## 2020-07-17 DIAGNOSIS — E538 Deficiency of other specified B group vitamins: Secondary | ICD-10-CM

## 2020-07-17 DIAGNOSIS — Z87891 Personal history of nicotine dependence: Secondary | ICD-10-CM | POA: Insufficient documentation

## 2020-08-14 ENCOUNTER — Inpatient Hospital Stay: Payer: Commercial Managed Care - PPO

## 2020-09-19 ENCOUNTER — Inpatient Hospital Stay: Payer: Commercial Managed Care - PPO

## 2020-09-20 ENCOUNTER — Other Ambulatory Visit: Payer: Self-pay

## 2020-09-20 ENCOUNTER — Inpatient Hospital Stay: Payer: Medicare HMO | Attending: Hematology and Oncology

## 2020-09-20 ENCOUNTER — Inpatient Hospital Stay: Payer: Medicare HMO

## 2020-09-20 DIAGNOSIS — E610 Copper deficiency: Secondary | ICD-10-CM | POA: Diagnosis not present

## 2020-09-20 DIAGNOSIS — D696 Thrombocytopenia, unspecified: Secondary | ICD-10-CM | POA: Diagnosis not present

## 2020-09-20 DIAGNOSIS — E538 Deficiency of other specified B group vitamins: Secondary | ICD-10-CM

## 2020-09-20 DIAGNOSIS — Z9884 Bariatric surgery status: Secondary | ICD-10-CM | POA: Insufficient documentation

## 2020-09-20 LAB — CBC WITH DIFFERENTIAL/PLATELET
Abs Immature Granulocytes: 0.02 10*3/uL (ref 0.00–0.07)
Basophils Absolute: 0 10*3/uL (ref 0.0–0.1)
Basophils Relative: 1 %
Eosinophils Absolute: 0.2 10*3/uL (ref 0.0–0.5)
Eosinophils Relative: 3 %
HCT: 42.1 % (ref 36.0–46.0)
Hemoglobin: 13.7 g/dL (ref 12.0–15.0)
Immature Granulocytes: 0 %
Lymphocytes Relative: 37 %
Lymphs Abs: 2.2 10*3/uL (ref 0.7–4.0)
MCH: 31.1 pg (ref 26.0–34.0)
MCHC: 32.5 g/dL (ref 30.0–36.0)
MCV: 95.5 fL (ref 80.0–100.0)
Monocytes Absolute: 0.5 10*3/uL (ref 0.1–1.0)
Monocytes Relative: 9 %
Neutro Abs: 3 10*3/uL (ref 1.7–7.7)
Neutrophils Relative %: 50 %
Platelets: 125 10*3/uL — ABNORMAL LOW (ref 150–400)
RBC: 4.41 MIL/uL (ref 3.87–5.11)
RDW: 14.5 % (ref 11.5–15.5)
WBC: 5.9 10*3/uL (ref 4.0–10.5)
nRBC: 0 % (ref 0.0–0.2)

## 2020-09-20 MED ORDER — CYANOCOBALAMIN 1000 MCG/ML IJ SOLN
1000.0000 ug | Freq: Once | INTRAMUSCULAR | Status: AC
  Administered 2020-09-20: 1000 ug via INTRAMUSCULAR
  Filled 2020-09-20: qty 1

## 2020-09-21 LAB — FOLATE: Folate: 33 ng/mL (ref >5.9)

## 2020-09-21 LAB — FERRITIN: Ferritin: 103 ng/mL (ref 11–307)

## 2020-10-18 ENCOUNTER — Inpatient Hospital Stay: Payer: Commercial Managed Care - PPO | Attending: Hematology and Oncology

## 2020-11-15 ENCOUNTER — Inpatient Hospital Stay: Payer: Commercial Managed Care - PPO | Attending: Hematology and Oncology

## 2020-12-13 ENCOUNTER — Inpatient Hospital Stay: Payer: Commercial Managed Care - PPO

## 2021-01-10 ENCOUNTER — Telehealth: Payer: Self-pay | Admitting: Hematology and Oncology

## 2021-01-10 ENCOUNTER — Inpatient Hospital Stay: Payer: Commercial Managed Care - PPO | Attending: Hematology and Oncology

## 2021-01-10 NOTE — Telephone Encounter (Signed)
Spoke to sister about asking patient to call to reschedule her appt.(B-12) that she missed on today. She stated she will have her call back.

## 2021-02-07 ENCOUNTER — Inpatient Hospital Stay: Payer: Commercial Managed Care - PPO | Attending: Hematology and Oncology

## 2021-03-07 ENCOUNTER — Inpatient Hospital Stay: Payer: Commercial Managed Care - PPO | Attending: Hematology and Oncology

## 2021-03-19 NOTE — Progress Notes (Incomplete)
[facility]  [address] [place], [place] [address] Phone: [phone]  Fax: [phone]   Clinic Day:  03/19/2021  Referring physician: Valera Castle, *  Chief Complaint: Morgan Stewart is a 75 y.o. female with mild thrombocytopenia and B12 deficiency who is seen for 1 year assessment.   HPI: The patient was last seen in the hematology clinic on 03/21/2020. At that time, she felt good. She denied any bruising or bleeding. Exam revealed no adenopathy or hepatosplenomegaly. Hematocrit was 44.7, hemoglobin 14.1, platelets 110,000, WBC 7,600. Ferritin was 78. Copper was 119. Vitamin B12 was 234.  Low dose chest CT on 06/20/2020 revealed Lung-RADS 2, benign appearance or behavior. Continue annual screening with low-dose chest CT without contrast in 12 months. There was bronchial wall thickening with bilateral lower lobe small airway impaction and associated foci of atelectasis. Underlying component of atypical infection was not excluded. 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 Attention on follow-up annual lung cancer screening CT was recommended. There was cholelithiasis. There was aortic atherosclerosis and emphysema .  Labs on 09/20/2020 revealed a hematocrit of 42.1, hemoglobin 13.7, platelets 125,000, WBC 5,900. Ferritin was 103. Folate was 33.0.  She received vitamin B12 injections on 05/25/2020 and 09/20/2020. B12 at [facility] on 03/09/2021 was 225.  During the interim, ***   Past Medical History:  Diagnosis Date  . 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 ABDOMINAL HYSTERECTOMY    . BREAST BIOPSY Left    bx/clip-neg    Family History  Problem Relation Age of Onset  . Breast cancer Sister 17    Social History:  reports that she has been smoking cigarettes. She has a 55.00 pack-year smoking history. She has never used smokeless tobacco. No history on file for alcohol use and  drug use. She has a dog namedBailey(border collie cocker mix). She smokes 5-6 cigarettes a day. She retired from [employer]. She lives home alone. The patient is alone*** today.  Allergies:  Allergies  Allergen Reactions  . Ace Inhibitors Hives, Other (See Comments) and Rash    Pt is not sure of reaction  Pt is not sure of reaction    . Doxycycline Other (See Comments)    Pt is not sure of reaction  Pt is not sure of reaction    . Codeine Nausea Only  . Orlistat Rash and Other (See Comments)    Pt is not sure of reaction  Pt is not sure of reaction    . Oxycodone Nausea And Vomiting  . Oxycodone-Acetaminophen Nausea Only  . Sulfa Antibiotics Other (See Comments), Rash and Hives    Lowering serum sodium levels Lowering serum sodium levels  Lowering serum sodium levels Lowering serum sodium levels    Current Medications: Current Outpatient Medications  Medication Sig Dispense Refill  . albuterol (VENTOLIN HFA) 108 (90 Base) MCG/ACT inhaler Inhale into the lungs.    Marland Kitchen aspirin 81 MG EC tablet Take by mouth.    . carvedilol (COREG) 6.25 MG tablet Take by mouth.    . Cholecalciferol 50 MCG (2000 UT) CAPS Take by mouth.    . Fluticasone-Salmeterol (ADVAIR) 250-50 MCG/DOSE AEPB Inhale into the lungs.    . hydrALAZINE (APRESOLINE) 25 MG tablet Take 50 mg by mouth.     Marland Kitchen ibuprofen (ADVIL) 600 MG tablet Take by mouth.    Marland Kitchen ipratropium (ATROVENT) 0.06 % nasal spray Place into  the nose.    . losartan (COZAAR) 100 MG tablet Take by mouth.    . Melatonin 1 MG TABS Take by mouth.    . Nebulizers (DEVILBISS DISPOSABLE NEBULIZER) MISC Use nebulizer every 6 hours as needed for wheezing.    . nitroGLYCERIN (NITROSTAT) 0.4 MG SL tablet Place under the tongue.    . rosuvastatin (CRESTOR) 40 MG tablet Take by mouth.    . traZODone (DESYREL) 50 MG tablet Take 50-100 mg by mouth at bedtime as needed.    . triamcinolone cream (KENALOG) 0.1 % APP EXT AA BID    .  venlafaxine XR (EFFEXOR-XR) 150 MG 24 hr capsule Take by mouth.     No current facility-administered medications for this visit.    Review of Systems  Constitutional: Negative.  Negative for chills, diaphoresis, fever, malaise/fatigue and weight loss (up 2 lbs).       Feels good.  HENT: Negative.  Negative for congestion, ear pain, nosebleeds, sinus pain and sore throat.   Eyes: Negative.  Negative for blurred vision, double vision and photophobia.  Respiratory: Negative for cough (intermittent; smoker; better), hemoptysis, sputum production, shortness of breath and wheezing.        Uses nebulizer BID.  Cardiovascular: Negative.  Negative for chest pain, palpitations, orthopnea, leg swelling and PND.  Gastrointestinal: Positive for constipation. Negative for abdominal pain, blood in stool, diarrhea, melena, nausea and vomiting.       Decreased appetite.  Genitourinary: Negative for dysuria, flank pain, frequency, hematuria and urgency.  Musculoskeletal: Positive for joint pain (left hip; pain increases with long distance walking). Negative for back pain, myalgias and neck pain.  Skin: Negative.  Negative for itching and rash.  Neurological: Negative.  Negative for dizziness, tingling, sensory change, speech change, focal weakness, seizures, weakness and headaches.  Endo/Heme/Allergies: Negative for environmental allergies. Does not bruise/bleed easily.  Psychiatric/Behavioral: Negative.  Negative for depression and memory loss. The patient is not nervous/anxious and does not have insomnia.   All other systems reviewed and are negative.  Performance status (ECOG): 1-2***  Vitals There were no vitals taken for this visit.   Physical Exam Vitals and nursing note reviewed.  Constitutional:      General: She is not in acute distress.    Appearance: She is well-developed. She is not diaphoretic.     Comments: Needs minimal assistance on/off exam room table. Ambulating with wheelchair.   HENT:     Head: Normocephalic and atraumatic.     Mouth/Throat:     Pharynx: No oropharyngeal exudate.  Eyes:     General: No scleral icterus.    Conjunctiva/sclera: Conjunctivae normal.     Pupils: Pupils are equal, round, and reactive to light.  Cardiovascular:     Rate and Rhythm: Normal rate and regular rhythm.     Heart sounds: Normal heart sounds. No murmur heard.   Pulmonary:     Effort: Pulmonary effort is normal. No respiratory distress.     Breath sounds: Wheezing present. No rales.  Chest:     Chest wall: No tenderness.  Breasts:     Right: No supraclavicular adenopathy.     Left: No supraclavicular adenopathy.    Abdominal:     General: Bowel sounds are normal. There is no distension.     Palpations: Abdomen is soft.     Tenderness: There is no abdominal tenderness. There is no guarding or rebound.  Musculoskeletal:        General: No tenderness. Normal range  of motion.     Cervical back: Normal range of motion and neck supple.  Lymphadenopathy:     Head:     Right side of head: No preauricular, posterior auricular or occipital adenopathy.     Left side of head: No preauricular, posterior auricular or occipital adenopathy.     Cervical: No cervical adenopathy.     Upper Body:     Right upper body: No supraclavicular adenopathy.     Left upper body: No supraclavicular adenopathy.     Lower Body: No right inguinal adenopathy. No left inguinal adenopathy.  Skin:    General: Skin is warm and dry.  Neurological:     Mental Status: She is alert and oriented to person, place, and time.  Psychiatric:        Behavior: Behavior normal.        Thought Content: Thought content normal.        Judgment: Judgment normal.    No visits with results within 3 Day(s) from this visit.  Latest known visit with results is:  Appointment on 09/20/2020  Component Date Value Ref Range Status  . Folate 09/20/2020 33.0  >5.9 ng/mL Final   Comment: PLEASE INTERPRET RESULTS WITH  CAUTION TEST RAN OUTSIDE OF SAMPLE STABILITY Performed at [facility], [address] [place], [place] [address]   . WBC 09/20/2020 5.9  4.0 - 10.5 K/uL Final  . RBC 09/20/2020 4.41  3.87 - 5.11 MIL/uL Final  . Hemoglobin 09/20/2020 13.7  12.0 - 15.0 g/dL Final  . HCT 09/20/2020 42.1  36.0 - 46.0 % Final  . MCV 09/20/2020 95.5  80.0 - 100.0 fL Final  . MCH 09/20/2020 31.1  26.0 - 34.0 pg Final  . MCHC 09/20/2020 32.5  30.0 - 36.0 g/dL Final  . RDW 09/20/2020 14.5  11.5 - 15.5 % Final  . Platelets 09/20/2020 125* 150 - 400 K/uL Final  . nRBC 09/20/2020 0.0  0.0 - 0.2 % Final  . Neutrophils Relative % 09/20/2020 50  % Final  . Neutro Abs 09/20/2020 3.0  1.7 - 7.7 K/uL Final  . Lymphocytes Relative 09/20/2020 37  % Final  . Lymphs Abs 09/20/2020 2.2  0.7 - 4.0 K/uL Final  . Monocytes Relative 09/20/2020 9  % Final  . Monocytes Absolute 09/20/2020 0.5  0.1 - 1.0 K/uL Final  . Eosinophils Relative 09/20/2020 3  % Final  . Eosinophils Absolute 09/20/2020 0.2  0.0 - 0.5 K/uL Final  . Basophils Relative 09/20/2020 1  % Final  . Basophils Absolute 09/20/2020 0.0  0.0 - 0.1 K/uL Final  . Immature Granulocytes 09/20/2020 0  % Final  . Abs Immature Granulocytes 09/20/2020 0.02  0.00 - 0.07 K/uL Final   Performed at [facility], [address] [place], [place] [address]  . Ferritin 09/20/2020 103  11 - 307 ng/mL Final   Comment: PLEASE INTERPRET RESULTS WITH CAUTION TEST RAN OUTSIDE OF SAMPLE STABILITY Performed at [facility] [address] [place], [place] [address]     Assessment:  Morgan Stewart is a 75 y.o. 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 Platelet count has ranged between 115,000 - 134,000 since 08/2018. She denies any new medications or herbal products. She does not drink quinine water. She has never had a transfusion.   She is s/p gastric bypass surgeryin 1982. She has B12 deficiency. B12 was 217 on 08/20/2018 and 386 on 11/17/2019.  She is on monthly B12 injections (not currently).  Work up  on 11/26/2019 revealed a hematocrit 41.6, hemoglobin 13.2, MCV 95.0, platelets 114,000, WBC 5700 and ANC 3200.  Platelet count in a citrate tube was 106,000.  Folate was 28.0. ANA and rheumatoid factor were negative.  Copper was 53 (72-166).  PT/INR 12.4/0.9 and APTT 33.   She recently had her last COVID-19 vaccine.  Symptomatically, ***  Plan: 1.  Labs today: CBC with diff, ferritin, iron studies   2.  Mild thrombocytopenia Platelets 110,000.  Etiology is unclear as copper level is now normal.             No evidence of pseudothrombocytopenia as platelet count in a blue top tube was normal. No medication or herbal product implicated. 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  Consider additional work-up if platelet count drops (hepatitis serologies, H. pylori antibody, abdominal ultrasound). 3.Mild copper deficiency             Copper was 53 (low) on 11/26/2019 and 119 (normal) on 03/21/2020.  Copper deficiency typically causes anemia and leukopenia and rarely thrombocytopenia.             She is on a multivitamin with 100% RDA of copper. 4.   B12 deficiency   B12 today is 234.    B12 goal is 400.   5.  Smoking history   Patient has a 55-pack-year smoking history.    Discuss low-dose chest CT program.   Patient is interested. 6.   Status post gastric bypass surgery   Check iron stores. 7.   RN to contact [facility] about low dose chest CT program. 8.   RTC in 6 months for labs (CBC with diff, ferritin, folate). 9.   RTC in 1 year for MD assessment, labs (CBC with diff, ferritin, iron studies).  Addendum: RN to contact patient regarding low B12 level and initiation of oral B12.  I discussed the assessment and treatment plan with the patient.  The patient was provided an opportunity to ask questions and all were answered.  The  patient agreed with the plan and demonstrated an understanding of the instructions.  The patient was advised to call back if the symptoms worsen or if the condition fails to improve as anticipated.  I provided *** minutes of face-to-face time during this encounter and > 50% was spent counseling as documented under my assessment and plan.    Lequita Asal, MD, PhD    03/19/2021, 11:41 AM  I, Mirian Mo Tufford, am acting as Education administrator for [employer]. Mike Gip, MD, PhD.  I, Melissa C. Mike Gip, MD, have reviewed the above documentation for accuracy and completeness, and I agree with the above.

## 2021-03-20 ENCOUNTER — Telehealth: Payer: Self-pay | Admitting: Hematology and Oncology

## 2021-03-20 ENCOUNTER — Inpatient Hospital Stay: Payer: Medicare HMO

## 2021-03-20 ENCOUNTER — Other Ambulatory Visit: Payer: Self-pay

## 2021-03-20 ENCOUNTER — Inpatient Hospital Stay: Payer: Medicare HMO | Attending: Hematology and Oncology | Admitting: Hematology and Oncology

## 2021-03-20 ENCOUNTER — Encounter: Payer: Self-pay | Admitting: Hematology and Oncology

## 2021-03-20 VITALS — BP 134/96 | HR 92 | Temp 98.0°F | Resp 18 | Wt 158.7 lb

## 2021-03-20 DIAGNOSIS — E538 Deficiency of other specified B group vitamins: Secondary | ICD-10-CM

## 2021-03-20 DIAGNOSIS — Z87891 Personal history of nicotine dependence: Secondary | ICD-10-CM

## 2021-03-20 DIAGNOSIS — D696 Thrombocytopenia, unspecified: Secondary | ICD-10-CM | POA: Diagnosis not present

## 2021-03-20 DIAGNOSIS — E610 Copper deficiency: Secondary | ICD-10-CM

## 2021-03-20 DIAGNOSIS — Z9884 Bariatric surgery status: Secondary | ICD-10-CM | POA: Diagnosis not present

## 2021-03-20 LAB — IRON AND TIBC
Iron: 77 ug/dL (ref 28–170)
Saturation Ratios: 27 % (ref 10.4–31.8)
TIBC: 283 ug/dL (ref 250–450)
UIBC: 206 ug/dL

## 2021-03-20 LAB — CBC WITH DIFFERENTIAL/PLATELET
Abs Immature Granulocytes: 0.02 10*3/uL (ref 0.00–0.07)
Basophils Absolute: 0.1 10*3/uL (ref 0.0–0.1)
Basophils Relative: 1 %
Eosinophils Absolute: 0.2 10*3/uL (ref 0.0–0.5)
Eosinophils Relative: 4 %
HCT: 45.5 % (ref 36.0–46.0)
Hemoglobin: 15.3 g/dL — ABNORMAL HIGH (ref 12.0–15.0)
Immature Granulocytes: 0 %
Lymphocytes Relative: 40 %
Lymphs Abs: 2.5 10*3/uL (ref 0.7–4.0)
MCH: 30.3 pg (ref 26.0–34.0)
MCHC: 33.6 g/dL (ref 30.0–36.0)
MCV: 90.1 fL (ref 80.0–100.0)
Monocytes Absolute: 0.4 10*3/uL (ref 0.1–1.0)
Monocytes Relative: 7 %
Neutro Abs: 3.1 10*3/uL (ref 1.7–7.7)
Neutrophils Relative %: 48 %
Platelets: 146 10*3/uL — ABNORMAL LOW (ref 150–400)
RBC: 5.05 MIL/uL (ref 3.87–5.11)
RDW: 14.2 % (ref 11.5–15.5)
WBC: 6.4 10*3/uL (ref 4.0–10.5)
nRBC: 0 % (ref 0.0–0.2)

## 2021-03-20 LAB — FERRITIN: Ferritin: 113 ng/mL (ref 11–307)

## 2021-03-20 MED ORDER — CYANOCOBALAMIN 1000 MCG/ML IJ SOLN
1000.0000 ug | Freq: Once | INTRAMUSCULAR | Status: AC
  Administered 2021-03-20: 1000 ug via INTRAMUSCULAR
  Filled 2021-03-20: qty 1

## 2021-03-20 NOTE — Progress Notes (Signed)
[place]  [address], [address] [place], [place] [address] Phone: [phone]  Fax: [phone]   Clinic Day:  03/20/2021  Referring physician: Valera Castle, *  Chief Complaint: Morgan Stewart is a 75 y.o. female with mild thrombocytopenia and B12 deficiency who is seen for 1 year assessment.   HPI: The patient was last seen in the hematology clinic on 03/21/2020. At that time, she felt good. She denied any bruising or bleeding. Exam revealed no adenopathy or hepatosplenomegaly. Hematocrit was 44.7, hemoglobin 14.1, platelets 110,000, WBC 7,600. Ferritin was 78. Copper was 119. Vitamin B12 was 234.  Low dose chest CT on 06/20/2020 revealed Lung-RADS 2, benign appearance or behavior. Continue annual screening with low-dose chest CT without contrast in 12 months. There was bronchial wall thickening with bilateral lower lobe small airway impaction and associated foci of atelectasis. Underlying component of atypical infection was not excluded. 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 Attention on follow-up annual lung cancer screening CT was recommended. There was cholelithiasis. There was aortic atherosclerosis and emphysema .  Labs on 09/20/2020 revealed a hematocrit of 42.1, hemoglobin 13.7, platelets 125,000, WBC 5,900. Ferritin was 103. Folate was 33.0.  She received vitamin B12 injections on 05/25/2020 and 09/20/2020. B12 at [facility] on 03/09/2021 was 225.  During the interim, she has been "good." She was attacked by her neighbor's dog 3 weeks ago and has a bite on her right calf. She is constipated. She uses her nebulizer BID. She denies bleeding of any kind. Her left pain has resolved.  She struggles with her memory. She is able to cook for herself and do everything she wants to do. She lives alone but her two sisters look after her. She eats whatever she wants. She eats meat and canned vegetables. She does not think she is taking vitamin  B12.   Past Medical History:  Diagnosis Date  . 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 ABDOMINAL HYSTERECTOMY    . BREAST BIOPSY Left    bx/clip-neg    Family History  Problem Relation Age of Onset  . Breast cancer Sister 65    Social History:  reports that she has been smoking cigarettes. She has a 55.00 pack-year smoking history. She has never used smokeless tobacco. No history on file for alcohol use and drug use. She does not drink alcohol. She has a dog namedBailey(border collie cocker mix). She smokes a cigarette about every hour. She retired from [employer]. She lives home alone. The patient is alone today.  Allergies:  Allergies  Allergen Reactions  . Ace Inhibitors Hives, Other (See Comments) and Rash    Pt is not sure of reaction  Pt is not sure of reaction    . Doxycycline Other (See Comments)    Pt is not sure of reaction  Pt is not sure of reaction    . Codeine Nausea Only  . Orlistat Rash and Other (See Comments)    Pt is not sure of reaction  Pt is not sure of reaction    . Oxycodone Nausea And Vomiting  . Oxycodone-Acetaminophen Nausea Only  . Sulfa Antibiotics Other (See Comments), Rash and Hives    Lowering serum sodium levels Lowering serum sodium levels  Lowering serum sodium levels Lowering serum sodium levels    Current Medications: Current Outpatient Medications  Medication Sig Dispense Refill  . albuterol (VENTOLIN HFA) 108 (90  Base) MCG/ACT inhaler Inhale into the lungs.    Marland Kitchen aspirin 81 MG EC tablet Take by mouth.    . Cholecalciferol 50 MCG (2000 UT) CAPS Take by mouth.    . hydrALAZINE (APRESOLINE) 25 MG tablet Take 50 mg by mouth.     Marland Kitchen ibuprofen (ADVIL) 600 MG tablet Take by mouth.    Marland Kitchen ipratropium (ATROVENT) 0.06 % nasal spray Place into the nose.    . losartan (COZAAR) 100 MG tablet Take by mouth.    . Melatonin 1 MG TABS Take by mouth.    . Nebulizers  (DEVILBISS DISPOSABLE NEBULIZER) MISC Use nebulizer every 6 hours as needed for wheezing.    . rosuvastatin (CRESTOR) 40 MG tablet Take by mouth.    . traZODone (DESYREL) 50 MG tablet Take 50-100 mg by mouth at bedtime as needed.    . triamcinolone cream (KENALOG) 0.1 % APP EXT AA BID    . carvedilol (COREG) 6.25 MG tablet Take by mouth.    . Fluticasone-Salmeterol (ADVAIR) 250-50 MCG/DOSE AEPB Inhale into the lungs. (Patient not taking: Reported on 03/20/2021)    . nitroGLYCERIN (NITROSTAT) 0.4 MG SL tablet Place under the tongue. (Patient not taking: Reported on 03/20/2021)    . venlafaxine XR (EFFEXOR-XR) 150 MG 24 hr capsule Take by mouth.     No current facility-administered medications for this visit.    Review of Systems  Constitutional: Positive for weight loss (21 lbs). Negative for chills, diaphoresis, fever and malaise/fatigue.       Feels "good."  HENT: Negative.  Negative for congestion, ear discharge, ear pain, hearing loss, nosebleeds, sinus pain, sore throat and tinnitus.   Eyes: Negative.  Negative for blurred vision, double vision and photophobia.  Respiratory: Negative for cough, hemoptysis, sputum production and shortness of breath.        Uses nebulizer BID.  Cardiovascular: Negative.  Negative for chest pain, palpitations and leg swelling.  Gastrointestinal: Positive for constipation. Negative for abdominal pain, blood in stool, diarrhea, heartburn, melena, nausea and vomiting.       Decreased appetite.  Genitourinary: Negative for dysuria, frequency, hematuria and urgency.  Musculoskeletal: Negative for back pain, joint pain, myalgias and neck pain.  Skin: Negative.  Negative for itching and rash.  Neurological: Negative.  Negative for dizziness, tingling, sensory change, speech change, focal weakness, seizures, weakness and headaches.  Endo/Heme/Allergies: Negative for environmental allergies. Does not bruise/bleed easily.  Psychiatric/Behavioral: Positive for memory  loss. Negative for depression. The patient is not nervous/anxious and does not have insomnia.   All other systems reviewed and are negative.  Performance status (ECOG): 1-2  Vitals Blood pressure (!) 134/96, pulse 92, temperature 98 F (36.7 C), temperature source Tympanic, resp. rate 18, weight 158 lb 11.7 oz (72 kg), SpO2 96 %.   Physical Exam Vitals and nursing note reviewed.  Constitutional:      General: She is not in acute distress.    Appearance: She is well-developed. She is not diaphoretic.     Comments: Needs minimal assistance on/off exam room table. Ambulating with wheelchair.  HENT:     Head: Normocephalic and atraumatic.     Mouth/Throat:     Mouth: Mucous membranes are moist.     Pharynx: Oropharynx is clear. No oropharyngeal exudate.  Eyes:     General: No scleral icterus.    Extraocular Movements: Extraocular movements intact.     Conjunctiva/sclera: Conjunctivae normal.     Pupils: Pupils are equal, round, and reactive to light.  Cardiovascular:     Rate and Rhythm: Normal rate and regular rhythm.     Heart sounds: Normal heart sounds. No murmur heard.   Pulmonary:     Effort: Pulmonary effort is normal. No respiratory distress.     Breath sounds: No rales.  Chest:     Chest wall: No tenderness.  Breasts:     Right: No axillary adenopathy or supraclavicular adenopathy.     Left: No axillary adenopathy or supraclavicular adenopathy.    Abdominal:     General: Bowel sounds are normal. There is no distension.     Palpations: Abdomen is soft. There is no mass.     Tenderness: There is no abdominal tenderness. There is no guarding or rebound.  Musculoskeletal:        General: Tenderness (right calf) present. No swelling. Normal range of motion.     Cervical back: Normal range of motion and neck supple.  Lymphadenopathy:     Head:     Right side of head: No preauricular, posterior auricular or occipital adenopathy.     Left side of head: No preauricular,  posterior auricular or occipital adenopathy.     Cervical: No cervical adenopathy.     Upper Body:     Right upper body: No supraclavicular or axillary adenopathy.     Left upper body: No supraclavicular or axillary adenopathy.     Lower Body: No right inguinal adenopathy. No left inguinal adenopathy.  Skin:    General: Skin is warm and dry.     Comments: Bruise and eschar on back of right calf from dog bite. Not warm. No evidence of infection.  Neurological:     Mental Status: She is alert and oriented to person, place, and time.  Psychiatric:        Behavior: Behavior normal.        Cognition and Memory: She exhibits impaired recent memory.        Judgment: Judgment normal.    Appointment on 03/20/2021  Component Date Value Ref Range Status  . WBC 03/20/2021 6.4  4.0 - 10.5 K/uL Final  . RBC 03/20/2021 5.05  3.87 - 5.11 MIL/uL Final  . Hemoglobin 03/20/2021 15.3* 12.0 - 15.0 g/dL Final  . HCT 03/20/2021 45.5  36.0 - 46.0 % Final  . MCV 03/20/2021 90.1  80.0 - 100.0 fL Final  . MCH 03/20/2021 30.3  26.0 - 34.0 pg Final  . MCHC 03/20/2021 33.6  30.0 - 36.0 g/dL Final  . RDW 03/20/2021 14.2  11.5 - 15.5 % Final  . Platelets 03/20/2021 146* 150 - 400 K/uL Final  . nRBC 03/20/2021 0.0  0.0 - 0.2 % Final  . Neutrophils Relative % 03/20/2021 48  % Final  . Neutro Abs 03/20/2021 3.1  1.7 - 7.7 K/uL Final  . Lymphocytes Relative 03/20/2021 40  % Final  . Lymphs Abs 03/20/2021 2.5  0.7 - 4.0 K/uL Final  . Monocytes Relative 03/20/2021 7  % Final  . Monocytes Absolute 03/20/2021 0.4  0.1 - 1.0 K/uL Final  . Eosinophils Relative 03/20/2021 4  % Final  . Eosinophils Absolute 03/20/2021 0.2  0.0 - 0.5 K/uL Final  . Basophils Relative 03/20/2021 1  % Final  . Basophils Absolute 03/20/2021 0.1  0.0 - 0.1 K/uL Final  . Immature Granulocytes 03/20/2021 0  % Final  . Abs Immature Granulocytes 03/20/2021 0.02  0.00 - 0.07 K/uL Final   Performed at [facility], [address],  [place], [place] [address]    Assessment:  Morgan Stewart is a 75 y.o. 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 Platelet count has ranged between 115,000 - 134,000 since 08/2018. She denies any new medications or herbal products. She does not drink quinine water. She has never had a transfusion.   She is s/p gastric bypass surgeryin 1982. She has B12 deficiency. B12 was 217 on 08/20/2018 and 386 on 11/17/2019. She is on monthly B12 injections (not currently).  Work up on 11/26/2019 revealed a hematocrit 41.6, hemoglobin 13.2, MCV 95.0, platelets 114,000, WBC 5700 and ANC 3200.  Platelet count in a citrate tube was 106,000.  Folate was 28.0. ANA and rheumatoid factor were negative.  Copper was 53 (72-166).  PT/INR 12.4/0.9 and APTT 33.   Low dose chest CT on 06/20/2020 revealed Lung-RADS 2, benign appearance or behavior. Continue annual screening with low-dose chest CT without contrast in 12 months. There was bronchial wall thickening with bilateral lower lobe small airway impaction and associated foci of atelectasis. Underlying component of atypical infection was not excluded. 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 Attention on follow-up annual lung cancer screening CT was recommended. There was cholelithiasis. There was aortic atherosclerosis and emphysema .  She recently had her last COVID-19 vaccine.  Symptomatically, she feels "good." She was attacked by her neighbor's dog 3 weeks ago and has a bite on her right calf.  She has constipation.  She struggles with her memory. She lives alone but her two sisters look after her. She eats whatever she wants. She eats meat and canned vegetables. She does not think she is taking vitamin B12.  Platelets are 146,000.  Plan: 1.  Labs today: CBC with diff, ferritin, iron studies. 2.  Mild thrombocytopenia Platelets 146,000.  Platelet count continues to improve: 110,000 to 125,000 to 146,000.             No evidence of  pseudothrombocytopenia as platelet count in a blue top tube was normal. No medication or herbal product implicated. 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  Consider additional work-up if platelet count drops (hepatitis serologies, H. pylori antibody, abdominal ultrasound).  No intervention needed. 3.Mild copper deficiency             Copper was 53 (low) on 11/26/2019 and 119 (normal) on 03/21/2020.  Copper deficiency typically causes anemia and leukopenia and rarely thrombocytopenia.             She is on a multivitamin with 100% RDA of copper. 4.   B12 deficiency   B12 was 234.    B12 goal is 400.   Discuss oral vs B12 injections.  Patient interested in B12 injections.  5.  Smoking history   Patient has a 55-pack-year smoking history.    Low dose chest CT personally reviewed and discussed with patient.   No evidence of malignancy.  Patient denies any change in cough or fever.   Continue annual low dose chest CT. 6.   Status post gastric bypass surgery   Hematocrit 45.5.  Hemoglobin 15.3.  MCV 90.1.   Ferritin 113 with an iron saturation of 27% and a TIBC 283.  Continue to monitor. 7.   Weight loss  Patient has had a 21 pound weight loss in the past year.  She feels that she is eating well.   Ensure she is up-to-date on colonoscopy.   Colonoscopy cancelled on 12/23/2019 with Dr Alice Reichert- note sent.  Low dose chest CT was negative.  Follow-up with Dr Kym Groom. 8.   B12 today and monthly x 12. 9.   RTC in 6 months for labs (CBC with diff, folate), and B12. 10.   RTC in 1 year for MD assessment, labs (CBC with diff, ferritin, iron studies), and B12.  I discussed the assessment and treatment plan with the patient.  The patient was provided an opportunity to ask questions and all were answered.  The patient agreed with the plan and demonstrated an understanding of the instructions.  The patient  was advised to call back if the symptoms worsen or if the condition fails to improve as anticipated.  I provided 17 minutes of face-to-face time during this encounter and > 50% was spent counseling as documented under my assessment and plan. An additional 8 minutes were spent reviewing her chart (Epic and Care Everywhere) including notes, labs, and imaging studies.    Lequita Asal, MD, PhD    03/20/2021, 3:32 PM  I, Mirian Mo Tufford, am acting as Education administrator for [employer]. Mike Gip, MD, PhD.  I, Sandar Krinke C. Mike Gip, MD, have reviewed the above documentation for accuracy and completeness, and I agree with the above.

## 2021-03-20 NOTE — Progress Notes (Signed)
Patient is unsure of medications she is taking. She reports dog bite on right calf. She reports occasional constipation.

## 2021-03-20 NOTE — Patient Instructions (Signed)
  Manage constipation with stool softeners, laxatives, magnesium (MOM), metamucil, and fruits that begin with the letter "P" (peaches, plums, prunes, etc).

## 2021-03-20 NOTE — Telephone Encounter (Signed)
03/20/2021 Left VM on mobile regarding missed appts recently, including today for yearly f/u. Reached out to pt's sister, Caren Griffins, she will check with pt and get back to me about r/s all appts  SRW

## 2021-03-21 ENCOUNTER — Ambulatory Visit: Payer: Commercial Managed Care - PPO | Admitting: Hematology and Oncology

## 2021-04-19 ENCOUNTER — Inpatient Hospital Stay: Payer: Commercial Managed Care - PPO | Attending: Nurse Practitioner

## 2021-05-17 ENCOUNTER — Inpatient Hospital Stay: Payer: Commercial Managed Care - PPO | Attending: Nurse Practitioner

## 2021-05-18 ENCOUNTER — Encounter: Admission: RE | Payer: Self-pay | Source: Home / Self Care

## 2021-05-18 ENCOUNTER — Ambulatory Visit: Admission: RE | Admit: 2021-05-18 | Payer: Commercial Managed Care - PPO | Source: Home / Self Care

## 2021-05-18 SURGERY — COLONOSCOPY WITH PROPOFOL
Anesthesia: General

## 2021-06-19 ENCOUNTER — Inpatient Hospital Stay: Payer: Commercial Managed Care - PPO | Attending: Internal Medicine

## 2021-07-19 ENCOUNTER — Inpatient Hospital Stay: Payer: Commercial Managed Care - PPO | Attending: Internal Medicine

## 2021-08-21 ENCOUNTER — Inpatient Hospital Stay: Payer: Commercial Managed Care - PPO | Attending: Internal Medicine

## 2021-09-20 ENCOUNTER — Inpatient Hospital Stay: Payer: Commercial Managed Care - PPO

## 2021-09-20 ENCOUNTER — Other Ambulatory Visit: Payer: Commercial Managed Care - PPO

## 2021-10-02 ENCOUNTER — Other Ambulatory Visit: Payer: Commercial Managed Care - PPO

## 2021-10-02 ENCOUNTER — Ambulatory Visit: Payer: Commercial Managed Care - PPO

## 2021-10-18 ENCOUNTER — Inpatient Hospital Stay: Payer: Commercial Managed Care - PPO

## 2021-10-22 ENCOUNTER — Inpatient Hospital Stay: Payer: Commercial Managed Care - PPO | Attending: Internal Medicine

## 2021-11-20 ENCOUNTER — Inpatient Hospital Stay: Payer: Commercial Managed Care - PPO | Attending: Internal Medicine

## 2021-12-05 ENCOUNTER — Encounter: Payer: Self-pay | Admitting: Oncology

## 2021-12-12 ENCOUNTER — Encounter: Payer: Self-pay | Admitting: Emergency Medicine

## 2021-12-12 ENCOUNTER — Emergency Department: Payer: Medicare HMO

## 2021-12-12 ENCOUNTER — Inpatient Hospital Stay
Admission: EM | Admit: 2021-12-12 | Discharge: 2021-12-31 | DRG: 956 | Disposition: A | Discharge status: Skilled Nursing Facility | Payer: Medicare HMO | Attending: Internal Medicine | Admitting: Internal Medicine

## 2021-12-12 ENCOUNTER — Other Ambulatory Visit: Payer: Self-pay

## 2021-12-12 DIAGNOSIS — T8149XA Infection following a procedure, other surgical site, initial encounter: Secondary | ICD-10-CM

## 2021-12-12 DIAGNOSIS — R410 Disorientation, unspecified: Secondary | ICD-10-CM | POA: Diagnosis not present

## 2021-12-12 DIAGNOSIS — U071 COVID-19: Secondary | ICD-10-CM | POA: Diagnosis not present

## 2021-12-12 DIAGNOSIS — N1831 Chronic kidney disease, stage 3a: Secondary | ICD-10-CM | POA: Diagnosis present

## 2021-12-12 DIAGNOSIS — W1830XA Fall on same level, unspecified, initial encounter: Secondary | ICD-10-CM | POA: Diagnosis present

## 2021-12-12 DIAGNOSIS — I2510 Atherosclerotic heart disease of native coronary artery without angina pectoris: Secondary | ICD-10-CM | POA: Diagnosis present

## 2021-12-12 DIAGNOSIS — I9581 Postprocedural hypotension: Secondary | ICD-10-CM | POA: Diagnosis not present

## 2021-12-12 DIAGNOSIS — J9601 Acute respiratory failure with hypoxia: Secondary | ICD-10-CM | POA: Diagnosis not present

## 2021-12-12 DIAGNOSIS — N182 Chronic kidney disease, stage 2 (mild): Secondary | ICD-10-CM | POA: Diagnosis not present

## 2021-12-12 DIAGNOSIS — T796XXA Traumatic ischemia of muscle, initial encounter: Secondary | ICD-10-CM | POA: Diagnosis present

## 2021-12-12 DIAGNOSIS — S72009A Fracture of unspecified part of neck of unspecified femur, initial encounter for closed fracture: Secondary | ICD-10-CM

## 2021-12-12 DIAGNOSIS — I10 Essential (primary) hypertension: Secondary | ICD-10-CM

## 2021-12-12 DIAGNOSIS — J449 Chronic obstructive pulmonary disease, unspecified: Secondary | ICD-10-CM | POA: Diagnosis present

## 2021-12-12 DIAGNOSIS — Z90710 Acquired absence of both cervix and uterus: Secondary | ICD-10-CM | POA: Diagnosis not present

## 2021-12-12 DIAGNOSIS — T8141XA Infection following a procedure, superficial incisional surgical site, initial encounter: Secondary | ICD-10-CM | POA: Diagnosis not present

## 2021-12-12 DIAGNOSIS — S72001S Fracture of unspecified part of neck of right femur, sequela: Secondary | ICD-10-CM | POA: Diagnosis not present

## 2021-12-12 DIAGNOSIS — Z9884 Bariatric surgery status: Secondary | ICD-10-CM

## 2021-12-12 DIAGNOSIS — R059 Cough, unspecified: Secondary | ICD-10-CM

## 2021-12-12 DIAGNOSIS — E876 Hypokalemia: Secondary | ICD-10-CM | POA: Diagnosis not present

## 2021-12-12 DIAGNOSIS — Z881 Allergy status to other antibiotic agents status: Secondary | ICD-10-CM

## 2021-12-12 DIAGNOSIS — Z888 Allergy status to other drugs, medicaments and biological substances status: Secondary | ICD-10-CM

## 2021-12-12 DIAGNOSIS — T796XXS Traumatic ischemia of muscle, sequela: Secondary | ICD-10-CM | POA: Diagnosis not present

## 2021-12-12 DIAGNOSIS — K5903 Drug induced constipation: Secondary | ICD-10-CM | POA: Diagnosis not present

## 2021-12-12 DIAGNOSIS — Z8541 Personal history of malignant neoplasm of cervix uteri: Secondary | ICD-10-CM

## 2021-12-12 DIAGNOSIS — Z7951 Long term (current) use of inhaled steroids: Secondary | ICD-10-CM

## 2021-12-12 DIAGNOSIS — F05 Delirium due to known physiological condition: Secondary | ICD-10-CM | POA: Diagnosis not present

## 2021-12-12 DIAGNOSIS — Z79899 Other long term (current) drug therapy: Secondary | ICD-10-CM

## 2021-12-12 DIAGNOSIS — G9341 Metabolic encephalopathy: Secondary | ICD-10-CM | POA: Diagnosis present

## 2021-12-12 DIAGNOSIS — Z716 Tobacco abuse counseling: Secondary | ICD-10-CM

## 2021-12-12 DIAGNOSIS — Z882 Allergy status to sulfonamides status: Secondary | ICD-10-CM

## 2021-12-12 DIAGNOSIS — F03918 Unspecified dementia, unspecified severity, with other behavioral disturbance: Secondary | ICD-10-CM | POA: Diagnosis present

## 2021-12-12 DIAGNOSIS — M109 Gout, unspecified: Secondary | ICD-10-CM | POA: Diagnosis present

## 2021-12-12 DIAGNOSIS — I129 Hypertensive chronic kidney disease with stage 1 through stage 4 chronic kidney disease, or unspecified chronic kidney disease: Secondary | ICD-10-CM | POA: Diagnosis present

## 2021-12-12 DIAGNOSIS — T40605A Adverse effect of unspecified narcotics, initial encounter: Secondary | ICD-10-CM | POA: Diagnosis not present

## 2021-12-12 DIAGNOSIS — Z7982 Long term (current) use of aspirin: Secondary | ICD-10-CM

## 2021-12-12 DIAGNOSIS — Z79891 Long term (current) use of opiate analgesic: Secondary | ICD-10-CM

## 2021-12-12 DIAGNOSIS — Z885 Allergy status to narcotic agent status: Secondary | ICD-10-CM

## 2021-12-12 DIAGNOSIS — E785 Hyperlipidemia, unspecified: Secondary | ICD-10-CM | POA: Diagnosis present

## 2021-12-12 DIAGNOSIS — R0902 Hypoxemia: Secondary | ICD-10-CM

## 2021-12-12 DIAGNOSIS — S72141A Displaced intertrochanteric fracture of right femur, initial encounter for closed fracture: Secondary | ICD-10-CM | POA: Diagnosis present

## 2021-12-12 DIAGNOSIS — R4587 Impulsiveness: Secondary | ICD-10-CM | POA: Diagnosis not present

## 2021-12-12 DIAGNOSIS — F17210 Nicotine dependence, cigarettes, uncomplicated: Secondary | ICD-10-CM | POA: Diagnosis present

## 2021-12-12 DIAGNOSIS — H9193 Unspecified hearing loss, bilateral: Secondary | ICD-10-CM | POA: Diagnosis present

## 2021-12-12 DIAGNOSIS — S72001D Fracture of unspecified part of neck of right femur, subsequent encounter for closed fracture with routine healing: Secondary | ICD-10-CM | POA: Diagnosis not present

## 2021-12-12 DIAGNOSIS — S72001A Fracture of unspecified part of neck of right femur, initial encounter for closed fracture: Secondary | ICD-10-CM | POA: Diagnosis not present

## 2021-12-12 DIAGNOSIS — Z419 Encounter for procedure for purposes other than remedying health state, unspecified: Secondary | ICD-10-CM

## 2021-12-12 LAB — CBC WITH DIFFERENTIAL/PLATELET
Abs Immature Granulocytes: 0.04 10*3/uL (ref 0.00–0.07)
Basophils Absolute: 0 10*3/uL (ref 0.0–0.1)
Basophils Relative: 0 %
Eosinophils Absolute: 0 10*3/uL (ref 0.0–0.5)
Eosinophils Relative: 0 %
HCT: 36.6 % (ref 36.0–46.0)
Hemoglobin: 12.2 g/dL (ref 12.0–15.0)
Immature Granulocytes: 0 %
Lymphocytes Relative: 10 %
Lymphs Abs: 1 10*3/uL (ref 0.7–4.0)
MCH: 31.4 pg (ref 26.0–34.0)
MCHC: 33.3 g/dL (ref 30.0–36.0)
MCV: 94.1 fL (ref 80.0–100.0)
Monocytes Absolute: 0.8 10*3/uL (ref 0.1–1.0)
Monocytes Relative: 8 %
Neutro Abs: 8.3 10*3/uL — ABNORMAL HIGH (ref 1.7–7.7)
Neutrophils Relative %: 82 %
Platelets: 112 10*3/uL — ABNORMAL LOW (ref 150–400)
RBC: 3.89 MIL/uL (ref 3.87–5.11)
RDW: 13.2 % (ref 11.5–15.5)
WBC: 10.2 10*3/uL (ref 4.0–10.5)
nRBC: 0 % (ref 0.0–0.2)

## 2021-12-12 LAB — RESP PANEL BY RT-PCR (FLU A&B, COVID) ARPGX2
Influenza A by PCR: NEGATIVE
Influenza B by PCR: NEGATIVE
SARS Coronavirus 2 by RT PCR: NEGATIVE

## 2021-12-12 LAB — COMPREHENSIVE METABOLIC PANEL
ALT: 33 U/L (ref 0–44)
AST: 82 U/L — ABNORMAL HIGH (ref 15–41)
Albumin: 4.1 g/dL (ref 3.5–5.0)
Alkaline Phosphatase: 69 U/L (ref 38–126)
Anion gap: 12 (ref 5–15)
BUN: 37 mg/dL — ABNORMAL HIGH (ref 8–23)
CO2: 25 mmol/L (ref 22–32)
Calcium: 10 mg/dL (ref 8.9–10.3)
Chloride: 107 mmol/L (ref 98–111)
Creatinine, Ser: 1.16 mg/dL — ABNORMAL HIGH (ref 0.44–1.00)
GFR, Estimated: 49 mL/min — ABNORMAL LOW (ref >60)
Glucose, Bld: 102 mg/dL — ABNORMAL HIGH (ref 70–99)
Potassium: 3.8 mmol/L (ref 3.5–5.1)
Sodium: 144 mmol/L (ref 135–145)
Total Bilirubin: 1.7 mg/dL — ABNORMAL HIGH (ref 0.3–1.2)
Total Protein: 6.6 g/dL (ref 6.5–8.1)

## 2021-12-12 LAB — TYPE AND SCREEN
ABO/RH(D): O NEG
Antibody Screen: NEGATIVE

## 2021-12-12 LAB — CK: Total CK: 2439 U/L — ABNORMAL HIGH (ref 38–234)

## 2021-12-12 MED ORDER — CEFAZOLIN SODIUM-DEXTROSE 2-4 GM/100ML-% IV SOLN
2.0000 g | INTRAVENOUS | Status: DC
  Filled 2021-12-12: qty 100

## 2021-12-12 MED ORDER — SODIUM CHLORIDE 0.9 % IV BOLUS
500.0000 mL | Freq: Once | INTRAVENOUS | Status: AC
  Administered 2021-12-12: 21:00:00 500 mL via INTRAVENOUS

## 2021-12-12 MED ORDER — MORPHINE SULFATE (PF) 2 MG/ML IV SOLN
2.0000 mg | INTRAVENOUS | Status: DC | PRN
  Administered 2021-12-13 (×2): 2 mg via INTRAVENOUS
  Filled 2021-12-12: qty 1

## 2021-12-12 MED ORDER — SODIUM CHLORIDE 0.9 % IV SOLN: Freq: Once | INTRAVENOUS | Status: AC

## 2021-12-12 NOTE — ED Provider Notes (Signed)
°  Emergency Medicine Provider Triage Evaluation Note  Morgan Stewart , a 75 y.o.female,  was evaluated in triage.  Pt complains of right-sided hip pain.  Per EMS, patient was found down on her right side.  Unwitnessed fall.  Reportedly has been there for the past 3 days.  Patient is unable to give a reliable history at this time.   Review of Systems  Positive: Right-sided hip pain Negative: Denies fever, chest pain, vomiting  Physical Exam  There were no vitals filed for this visit. Gen:   Awake, no distress   Resp:  Normal effort  MSK:              Tenderness in the right hip, external rotation and shortening. Other:    Medical Decision Making  Given the patient's initial medical screening exam, the following diagnostic evaluation has been ordered. The patient will be placed in the appropriate treatment space, once one is available, to complete the evaluation and treatment. I have discussed the plan of care with the patient and I have advised the patient that an ED physician or mid-level practitioner will reevaluate their condition after the test results have been received, as the results may give them additional insight into the type of treatment they may need.    Diagnostics: Labs, head/neck CT, DG hip, EKG, urinalysis  Treatments: none immediately   Teodoro Spray, PA 12/12/21 1422    Vladimir Crofts, MD 12/12/21 [phone]

## 2021-12-12 NOTE — ED Provider Notes (Signed)
[facility] Emergency Department Provider Note    Event Date/Time   First MD Initiated Contact with Patient 12/12/21 2052     (approximate)  I have reviewed the triage vital signs and the nursing notes.   HISTORY  Chief Complaint Fall    HPI Morgan Stewart is a 75 y.o. female reported history of cervical cancer as well as CKD COPD presents to the ER for evaluation of being found down at home.  Last seen normal was last night.  Is an unwitnessed fall.  Does have dementia and lives at home alone with family checking on her daily.  She was found on the ground with deformity right leg unable to ambulate.  She denies any other pain or discomfort at this time.  Not on blood thinners.  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   Thrombocytopenia ([place]) 11/26/2019   COPD (chronic obstructive pulmonary disease) ([place]) 11/26/2019   Gout 11/26/2019   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   LVH (left ventricular hypertrophy) 05/06/2019   Palpitations 05/03/2019   Chronic kidney disease (CKD) stage G3a/A1, moderately decreased glomerular filtration rate (GFR) between 45-59 mL/min/1.73 square meter and albuminuria creatinine ratio less than 30 mg/g ([place]) 04/07/2019   Vitamin D deficiency 04/07/2019   Low vitamin B12 level 02/03/2019   Left hip pain 08/11/2018   Radicular pain of lumbosacral region 08/11/2018   Low vitamin D level 08/03/2018   Opacity of lung on imaging study 10/18/2015   Microscopic hematuria 03/08/2014    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 09/10/2012   Hallux valgus with bunions 09/10/2012   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   Hyperlipidemia 10/14/2011   Coronary artery disease involving native coronary artery of native heart 07/09/2011   Essential (primary) hypertension 07/09/2011      Prior to Admission medications   Medication Sig Start Date End Date Taking? Authorizing Provider  albuterol (VENTOLIN HFA) 108 (90 Base) MCG/ACT inhaler Inhale into the lungs. 06/16/15   [provider]  aspirin 81 MG EC tablet Take by mouth.    [provider]  carvedilol (COREG) 6.25 MG tablet Take by mouth. 04/02/19 04/01/20  [provider]  Cholecalciferol 50 MCG (2000 UT) CAPS Take by mouth.    [provider]  Fluticasone-Salmeterol (ADVAIR) 250-50 MCG/DOSE AEPB Inhale into the lungs. Patient not taking: Reported on 03/20/2021 01/14/18   [provider]  hydrALAZINE (APRESOLINE) 25 MG tablet Take 50 mg by mouth.  11/10/17   [provider]  ibuprofen (ADVIL) 600 MG tablet Take by mouth.    [provider]  ipratropium (ATROVENT) 0.06 % nasal spray Place into the nose. 09/10/18   [provider]  losartan (COZAAR) 100 MG tablet Take by mouth. 11/20/17   [provider]  Melatonin 1 MG TABS Take by mouth.    [provider]  Nebulizers (DEVILBISS DISPOSABLE NEBULIZER) MISC Use nebulizer every 6 hours as needed for wheezing. 11/15/14   [provider]  nitroGLYCERIN (  NITROSTAT) 0.4 MG SL tablet Place under the tongue. Patient not taking: Reported on 03/20/2021 04/29/18   [provider]  rosuvastatin (CRESTOR) 40 MG tablet Take by mouth. 11/10/17   [provider]  traZODone (DESYREL) 50 MG tablet Take 50-100 mg by mouth at bedtime as needed. 11/16/19   [provider]  triamcinolone cream (KENALOG) 0.1 % APP EXT AA BID 02/17/18   [provider]  venlafaxine XR (EFFEXOR-XR) 150 MG 24 hr  capsule Take by mouth. 12/18/18 03/21/20  [provider]    Allergies Ace inhibitors, Doxycycline, Codeine, Orlistat, Oxycodone, Oxycodone-acetaminophen, and Sulfa antibiotics    Social History Social History   Tobacco Use   Smoking status: Every Day    Packs/day: 1.00    Years: 55.00    Pack years: 55.00    Types: Cigarettes   Smokeless tobacco: Never   Tobacco comments:    .5ppd currently  Vaping Use   Vaping Use: Never used    Review of Systems Patient denies headaches, rhinorrhea, blurry vision, numbness, shortness of breath, chest pain, edema, cough, abdominal pain, nausea, vomiting, diarrhea, dysuria, fevers, rashes or hallucinations unless otherwise stated above in HPI. ____________________________________________   PHYSICAL EXAM:  VITAL SIGNS: Vitals:   12/12/21 1828 12/12/21 2222  BP: (!) 144/85 (!) 120/99  Pulse: 94 96  Resp: 16   Temp:    SpO2: 94% 96%    Constitutional: Alert ill appearing Eyes: Conjunctivae are normal.  Head: Atraumatic. Nose: No congestion/rhinnorhea. Mouth/Throat: Mucous membranes are moist.   Neck: No stridor. Painless ROM.  Cardiovascular: Normal rate, regular rhythm. Grossly normal heart sounds.  Good peripheral circulation. Respiratory: Normal respiratory effort.  No retractions. Lungs CTAB. Gastrointestinal: Soft and nontender. No distention. No abdominal bruits. No CVA tenderness. Genitourinary:  Musculoskeletal: right leg shortened,  compartments soft. Neurologic:  Normal speech and language. No gross focal neurologic deficits are appreciated. No facial droop Skin:  Skin is warm, dry and intact. No rash noted. Psychiatric: Mood and affect are normal. Speech and behavior are normal.  ____________________________________________   LABS (all labs ordered are listed, but only abnormal results are displayed)  Results for orders placed or performed during the hospital encounter of 12/12/21 (from the past 24 hour(s))   Comprehensive metabolic panel     Status: Abnormal   Collection Time: 12/12/21  3:06 PM  Result Value Ref Range   Sodium 144 135 - 145 mmol/L   Potassium 3.8 3.5 - 5.1 mmol/L   Chloride 107 98 - 111 mmol/L   CO2 25 22 - 32 mmol/L   Glucose, Bld 102 (H) 70 - 99 mg/dL   BUN 37 (H) 8 - 23 mg/dL   Creatinine, Ser 1.16 (H) 0.44 - 1.00 mg/dL   Calcium 10.0 8.9 - 10.3 mg/dL   Total Protein 6.6 6.5 - 8.1 g/dL   Albumin 4.1 3.5 - 5.0 g/dL   AST 82 (H) 15 - 41 U/L   ALT 33 0 - 44 U/L   Alkaline Phosphatase 69 38 - 126 U/L   Total Bilirubin 1.7 (H) 0.3 - 1.2 mg/dL   GFR, Estimated 49 (L) >60 mL/min   Anion gap 12 5 - 15  CBC with Differential     Status: Abnormal   Collection Time: 12/12/21  3:06 PM  Result Value Ref Range   WBC 10.2 4.0 - 10.5 K/uL   RBC 3.89 3.87 - 5.11 MIL/uL   Hemoglobin 12.2 12.0 - 15.0 g/dL   HCT 36.6 36.0 -  46.0 %   MCV 94.1 80.0 - 100.0 fL   MCH 31.4 26.0 - 34.0 pg   MCHC 33.3 30.0 - 36.0 g/dL   RDW 13.2 11.5 - 15.5 %   Platelets 112 (L) 150 - 400 K/uL   nRBC 0.0 0.0 - 0.2 %   Neutrophils Relative % 82 %   Neutro Abs 8.3 (H) 1.7 - 7.7 K/uL   Lymphocytes Relative 10 %   Lymphs Abs 1.0 0.7 - 4.0 K/uL   Monocytes Relative 8 %   Monocytes Absolute 0.8 0.1 - 1.0 K/uL   Eosinophils Relative 0 %   Eosinophils Absolute 0.0 0.0 - 0.5 K/uL   Basophils Relative 0 %   Basophils Absolute 0.0 0.0 - 0.1 K/uL   WBC Morphology MORPHOLOGY UNREMARKABLE    RBC Morphology MORPHOLOGY UNREMARKABLE    Smear Review MORPHOLOGY UNREMARKABLE    Immature Granulocytes 0 %   Abs Immature Granulocytes 0.04 0.00 - 0.07 K/uL  CK     Status: Abnormal   Collection Time: 12/12/21  3:06 PM  Result Value Ref Range   Total CK 2,439 (H) 38 - 234 U/L  Resp Panel by RT-PCR (Flu A&B, Covid) Nasopharyngeal Swab     Status: None   Collection Time: 12/12/21  3:06 PM   Specimen: Nasopharyngeal Swab; Nasopharyngeal(NP) swabs in vial transport medium  Result Value Ref Range   SARS Coronavirus 2  by RT PCR NEGATIVE NEGATIVE   Influenza A by PCR NEGATIVE NEGATIVE   Influenza B by PCR NEGATIVE NEGATIVE  Type and screen [place]     Status: None (Preliminary result)   Collection Time: 12/12/21  9:13 PM  Result Value Ref Range   ABO/RH(D) PENDING    Antibody Screen PENDING    Sample Expiration      12/15/2021,2359 Performed at [facility], [address] [place], [place] [address]    ____________________________________________  EKG My review and personal interpretation at Time: 15:16   Indication: fall  Rate: 90  Rhythm: sinus Axis: normal Other: normal intervals, nos temi ____________________________________________  RADIOLOGY  I personally reviewed all radiographic images ordered to evaluate for the above acute complaints and reviewed radiology reports and findings.  These findings were personally discussed with the patient.  Please see medical record for radiology report.  ____________________________________________   PROCEDURES  Procedure(s) performed:  Procedures    Critical Care performed: no ____________________________________________   INITIAL IMPRESSION / ASSESSMENT AND PLAN / ED COURSE  Pertinent labs & imaging results that were available during my care of the patient were reviewed by me and considered in my medical decision making (see chart for details).   DDX: fracture, dislocation, contusion, sepsis, ckd, rhabdo, sdh, iph, tia  Jaren Kearn is a 75 y.o. who presents to the ED with presentation as described above.  Imaging shows evidence of acute right intertrochanteric fracture is comminuted and displaced.  Discussed case with Dr. Mack Guise of orthopedics in consultation.  Has recommended CT imaging of the hip for preop planning.  No evidence of CVA or acute intracranial abnormality.  Patient had prolonged downtime does have evidence of acute rhabdomyolysis have ordered IV fluids.  Have I ordered IV morphine.   Patient will require hospitalization for further medical management.     The patient was evaluated in Emergency Department today for the symptoms described in the history of present illness. He/she was evaluated in the context of the global COVID-19 pandemic, which necessitated consideration that the patient might be  at risk for infection with the SARS-CoV-2 virus that causes COVID-19. Institutional protocols and algorithms that pertain to the evaluation of patients at risk for COVID-19 are in a state of rapid change based on information released by regulatory bodies including the CDC and federal and state organizations. These policies and algorithms were followed during the patient's care in the ED.  As part of my medical decision making, I reviewed the following data within the [place] notes reviewed and incorporated, Labs reviewed, notes from prior ED visits and [place] Controlled Substance Database   ____________________________________________   FINAL CLINICAL IMPRESSION(S) / ED DIAGNOSES  Final diagnoses:  Closed displaced intertrochanteric fracture of right femur, initial encounter ([place])  Traumatic rhabdomyolysis, initial encounter ([place])      NEW MEDICATIONS STARTED DURING THIS VISIT:  New Prescriptions   No medications on file     Note:  This document was prepared using Dragon voice recognition software and may include unintentional dictation errors.    Merlyn Lot, MD 12/12/21 2234

## 2021-12-12 NOTE — H&P (Addendum)
[place]   PATIENT NAME: Morgan Stewart    MR#:  [id number]  DATE OF BIRTH:  Dec 06, 1946  DATE OF ADMISSION:  12/12/2021  PRIMARY CARE PHYSICIAN: Lequita Asal, MD (Inactive)   Patient is coming from: Home  REQUESTING/REFERRING PHYSICIAN: Merlyn Lot, MD CHIEF COMPLAINT:   Chief Complaint  Patient presents with   Fall    HISTORY OF PRESENT ILLNESS:  Morgan Stewart is a 75 y.o. Caucasian female with medical history significant for Coronary artery disease, hypertension, COPD, DJD, gout, dyslipidemia, vitamin D deficiency, vitamin B12 deficiency and tobacco abuse, presented to the ER with a [place] of right hip pain after having a fall last night.  It is unclear if she had a syncope or not.  She stated that she could not get up after falling.  This was obviously an unwitnessed fall.  Despite her dementia she lives independently at home.  She was noted to have a right lower extremity deformity and therefore brought to the ER. ED Course: When she came here, BP was elevated and later 144/85.  She was 85% on room air that were thought be related to morphine and later 94% on 3 L of O2 by nasal cannula  then 91% on room air.  Labs revealed a BUN of 37 and creatinine 1.16 with a blood glucose of 102 and AST of 82 with total bili of 1.7 and GFR of 49.  CK came back elevated at 2439.  CBC was unremarkable.  Influenza antigens and COVID-19 PCR came back negative.  Blood group was O- with negative antibody screen.  EKG as reviewed by me : EKG showed sinus rhythm with a rate of 91 with premature atrial complexes with aberrant conduction, incomplete right bundle branch block and LVH with repolarization abnormality. Imaging: Noncontrast head CT scan revealed no acute intracranial abnormality and C-spine CT showed severe multilevel degenerative disc disease with no acute abnormality.  Right hip CT without contrast showed complex comminuted intratrochanteric fracture of the right  hip with moderate impaction and associated soft tissue swelling with intramuscular hemorrhage involving the right hip and thigh muscles.  The patient was given 2 mg of IV morphine sulfate, 500 mg of IV normal saline bolus followed 100 mL/h and 2 g of IV Ancef after discussion with Dr. Mack Guise.  She will be admitted to a surgical telemetry bed for further evaluation and management. PAST MEDICAL HISTORY:   Past Medical History:  Diagnosis Date   Cancer ([place])    cervical  Coronary artery disease, hypertension, COPD, DJD, gout, dyslipidemia, vitamin D deficiency, vitamin B12 deficiency and tobacco abuse.  PAST SURGICAL HISTORY:   Past Surgical History:  Procedure Laterality Date   ABDOMINAL HYSTERECTOMY     BREAST BIOPSY Left    bx/clip-neg    SOCIAL HISTORY:   Social History   Tobacco Use   Smoking status: Every Day    Packs/day: 1.00    Years: 55.00    Pack years: 55.00    Types: Cigarettes   Smokeless tobacco: Never   Tobacco comments:    .5ppd currently  Substance Use Topics   Alcohol use: Not on file    FAMILY HISTORY:   Family History  Problem Relation Age of Onset   Breast cancer Sister 5    DRUG ALLERGIES:   Allergies  Allergen Reactions   Ace Inhibitors Hives, Other (See Comments) and Rash    Pt is not sure of reaction  Pt is not sure of  reaction     Doxycycline Other (See Comments)    Pt is not sure of reaction  Pt is not sure of reaction     Codeine Nausea Only   Orlistat Rash and Other (See Comments)    Pt is not sure of reaction  Pt is not sure of reaction     Oxycodone Nausea And Vomiting   Oxycodone-Acetaminophen Nausea Only   Sulfa Antibiotics Other (See Comments), Rash and Hives    Lowering serum sodium levels Lowering serum sodium levels  Lowering serum sodium levels Lowering serum sodium levels    REVIEW OF SYSTEMS:   ROS As per history of present illness. All pertinent systems were reviewed above.  Constitutional, HEENT, cardiovascular, respiratory, GI, GU, musculoskeletal, neuro, psychiatric, endocrine, integumentary and hematologic systems were reviewed and are otherwise negative/unremarkable except for positive findings mentioned above in the HPI.   MEDICATIONS AT HOME:   Prior to Admission medications   Medication Sig Start Date End Date Taking? Authorizing Provider  albuterol (VENTOLIN HFA) 108 (90 Base) MCG/ACT inhaler Inhale into the lungs. 06/16/15   [provider]  aspirin 81 MG EC tablet Take by mouth.    [provider]  carvedilol (COREG) 6.25 MG tablet Take by mouth. 04/02/19 04/01/20  [provider]  Cholecalciferol 50 MCG (2000 UT) CAPS Take by mouth.    [provider]  Fluticasone-Salmeterol (ADVAIR) 250-50 MCG/DOSE AEPB Inhale into the lungs. Patient not taking: Reported on 03/20/2021 01/14/18   [provider]  hydrALAZINE (APRESOLINE) 25 MG tablet Take 50 mg by mouth.  11/10/17   [provider]  ibuprofen (ADVIL) 600 MG tablet Take by mouth.    [provider]  ipratropium (ATROVENT) 0.06 % nasal spray Place into the nose. 09/10/18   [provider]  losartan (COZAAR) 100 MG tablet Take by mouth. 11/20/17   [provider]  Melatonin 1 MG TABS Take by mouth.    [provider]  Nebulizers (DEVILBISS DISPOSABLE NEBULIZER) MISC Use nebulizer every 6 hours as needed for wheezing. 11/15/14   [provider]  nitroGLYCERIN (NITROSTAT) 0.4 MG SL tablet Place under the tongue. Patient not taking: Reported on 03/20/2021 04/29/18   [provider]  rosuvastatin (CRESTOR) 40 MG tablet Take by mouth. 11/10/17   [provider]  traZODone (DESYREL) 50 MG tablet Take 50-100 mg by mouth at bedtime as needed. 11/16/19   [provider]  triamcinolone cream (KENALOG) 0.1 % APP EXT AA BID 02/17/18   [provider]  venlafaxine XR (EFFEXOR-XR) 150 MG 24 hr capsule  Take by mouth. 12/18/18 03/21/20  [provider]      VITAL SIGNS:  Blood pressure (!) 144/85, pulse 94, temperature 98.7 F (37.1 C), temperature source Axillary, resp. rate 16, height 5\' 6"  (1.676 m), weight 72 kg, SpO2 94 %.  PHYSICAL EXAMINATION:  Physical Exam  GENERAL:  75 y.o.-year-old Caucasian female patient lying in the bed with no acute distress.  EYES: Pupils equal, round, reactive to light and accommodation. No scleral icterus. Extraocular muscles intact.  HEENT: Head atraumatic, normocephalic. Oropharynx and nasopharynx clear.  NECK:  Supple, no jugular venous distention. No thyroid enlargement, no tenderness.  LUNGS: Normal breath sounds bilaterally, no wheezing, rales,rhonchi or crepitation. No use of accessory muscles of respiration.  CARDIOVASCULAR: Regular rate and rhythm, S1, S2 normal. No murmurs, rubs, or gallops.  ABDOMEN: Soft, nondistended, nontender. Bowel sounds present. No organomegaly or mass.  EXTREMITIES: No pedal edema, cyanosis, or  clubbing.  NEUROLOGIC: Cranial nerves II through XII are intact. Muscle strength 5/5 in all extremities. Sensation intact. Gait not checked. Musculoskeletal: Right hip tenderness with lateral rotation of the right lower extremity. PSYCHIATRIC: The patient is alert and oriented x 3.  Normal affect and good eye contact. SKIN: No obvious rash, lesion, or ulcer.   LABORATORY PANEL:   CBC Recent Labs  Lab 12/12/21 1506  WBC 10.2  HGB 12.2  HCT 36.6  PLT 112*   ------------------------------------------------------------------------------------------------------------------  Chemistries  Recent Labs  Lab 12/12/21 1506  NA 144  K 3.8  CL 107  CO2 25  GLUCOSE 102*  BUN 37*  CREATININE 1.16*  CALCIUM 10.0  AST 82*  ALT 33  ALKPHOS 69  BILITOT 1.7*   ------------------------------------------------------------------------------------------------------------------  Cardiac Enzymes No results for input(s):  TROPONINI in the last 168 hours. ------------------------------------------------------------------------------------------------------------------  RADIOLOGY:  DG Chest 1 View  Result Date: 12/12/2021 CLINICAL DATA:  Right hip pain EXAM: CHEST  1 VIEW COMPARISON:  Chest CT dated June 20, 2020 FINDINGS: Cardiac and mediastinal contours within normal limits. Elevation of the left hemidiaphragm. Lungs are clear. No large pleural effusion or evidence pneumothorax. IMPRESSION: No active disease. Electronically Signed   By: Yetta Glassman M.D.   On: 12/12/2021 14:57   CT Head Wo Contrast  Result Date: 12/12/2021 CLINICAL DATA:  Unwitnessed fall. EXAM: CT HEAD WITHOUT CONTRAST CT CERVICAL SPINE WITHOUT CONTRAST TECHNIQUE: Multidetector CT imaging of the head and cervical spine was performed following the standard protocol without intravenous contrast. Multiplanar CT image reconstructions of the cervical spine were also generated. COMPARISON:  None. FINDINGS: CT HEAD FINDINGS Brain: Mild chronic ischemic white matter disease is noted. No mass effect or midline shift is noted. Ventricular size is within normal limits. There is no evidence of mass lesion, hemorrhage or acute infarction. Vascular: No hyperdense vessel or unexpected calcification. Skull: Normal. Negative for fracture or focal lesion. Sinuses/Orbits: No acute finding. Other: None. CT CERVICAL SPINE FINDINGS Alignment: Mild grade 1 anterolisthesis of C3-4 and C4-5 is noted secondary to posterior facet joint hypertrophy. Skull base and vertebrae: No acute fracture. No primary bone lesion or focal pathologic process. Soft tissues and spinal canal: No prevertebral fluid or swelling. No visible canal hematoma. Disc levels: Severe degenerative disc disease is noted at C5-6 and C6-7 with anterior posterior osteophyte formation. Upper chest: Negative. Other: Degenerative changes are seen involving posterior facet joints bilaterally. IMPRESSION: No acute  intracranial abnormality seen. Severe multilevel degenerative disc disease. No acute abnormality seen in the cervical spine. Electronically Signed   By: Marijo Conception M.D.   On: 12/12/2021 15:23   CT Cervical Spine Wo Contrast  Result Date: 12/12/2021 CLINICAL DATA:  Unwitnessed fall. EXAM: CT HEAD WITHOUT CONTRAST CT CERVICAL SPINE WITHOUT CONTRAST TECHNIQUE: Multidetector CT imaging of the head and cervical spine was performed following the standard protocol without intravenous contrast. Multiplanar CT image reconstructions of the cervical spine were also generated. COMPARISON:  None. FINDINGS: CT HEAD FINDINGS Brain: Mild chronic ischemic white matter disease is noted. No mass effect or midline shift is noted. Ventricular size is within normal limits. There is no evidence of mass lesion, hemorrhage or acute infarction. Vascular: No hyperdense vessel or unexpected calcification. Skull: Normal. Negative for fracture or focal lesion. Sinuses/Orbits: No acute finding. Other: None. CT CERVICAL SPINE FINDINGS Alignment: Mild grade 1 anterolisthesis of C3-4 and C4-5 is noted secondary to posterior facet joint hypertrophy. Skull base and vertebrae: No acute fracture. No primary bone lesion  or focal pathologic process. Soft tissues and spinal canal: No prevertebral fluid or swelling. No visible canal hematoma. Disc levels: Severe degenerative disc disease is noted at C5-6 and C6-7 with anterior posterior osteophyte formation. Upper chest: Negative. Other: Degenerative changes are seen involving posterior facet joints bilaterally. IMPRESSION: No acute intracranial abnormality seen. Severe multilevel degenerative disc disease. No acute abnormality seen in the cervical spine. Electronically Signed   By: Marijo Conception M.D.   On: 12/12/2021 15:23   DG Hip Unilat With Pelvis 2-3 Views Right  Result Date: 12/12/2021 CLINICAL DATA:  RIGHT leg pain, deformity and dementia. EXAM: DG HIP (WITH OR WITHOUT PELVIS) 2-3V  RIGHT COMPARISON:  None FINDINGS: Over riding, fracture with both Verus and apex anterior angulation involving the intratrochanteric RIGHT femur and associated with mild comminution. Femoral head is located. LEFT hip unremarkable on AP projection. No sign of fracture of the bony pelvis. Degenerative changes in the lower lumbar spine. IMPRESSION: Angulated, comminuted, and overriding intertrochanteric fracture of the RIGHT femur. Electronically Signed   By: Zetta Bills M.D.   On: 12/12/2021 14:58      IMPRESSION AND PLAN:  Principal Problem:   Closed right hip fracture (HCC)  1.  Closed right hip fracture secondary to fall possibly mechanical.  The patient has subsequent acute rhabdomyolysis.  The patient had post narcotics mild hypoxia, currently resolving. - The patient will be admitted to a surgical telemetry bed. - Pain management to be provided. - Aspirin will be held off. - The patient will be n.p.o. after midnight. - Will be hydrated with IV normal saline. - We will follow serial stool enzymes. - Orthopedic consultation will be obtained. - Dr. Mack Guise was notified about the patient. - She has no history of CHF but has a history of coronary artery disease.  No history of diabetes mellitus on insulin, renal failure or CVA.  She is considered above average risk for her age for perioperative cardiovascular events per the revised cardiac risk index. - We will obtain a chest CTA to rule out PE given immobility and hypoxia.  2.  Essential hypertension with elevated BP likely secondary to pain. - We will continue her Coreg which should offer perioperative cardiovascular risk reduction, as well as hydralazine and Cozaar.  3.  Dyslipidemia. - We will continue statin therapy that should offer perioperative cardiovascular risk reduction.  4.  Depression. - We will continue Effexor XR and trazodone.  5.  Coronary artery disease. - We will continue as needed sublingual nitroglycerin as  well as beta-blocker therapy. - Aspirin will be held off given expected surgical intervention.  DVT prophylaxis: SCDs. Code Status: full code. Family Communication:  The plan of care was discussed in details with the patient (and family). I answered all questions. The patient agreed to proceed with the above mentioned plan. Further management will depend upon hospital course. Disposition Plan: Back to previous home environment Consults called: Orthopedic consult. All the records are reviewed and case discussed with ED provider.  Status is: Inpatient   At the time of the admission, it appears that the appropriate admission status for this patient is inpatient.  This is judged to be reasonable and necessary in order to provide the required intensity of service to ensure the patient's safety given the presenting symptoms, physical exam findings and initial radiographic and laboratory data in the context of comorbid conditions.  The patient requires inpatient status due to high intensity of service, high risk of further deterioration and high  frequency of surveillance required.  I certify that at the time of admission, it is my clinical judgment that the patient will require inpatient hospital care extending more than 2 midnights.                            Dispo: The patient is from: Home              Anticipated d/c is to: Home              Patient currently is not medically stable to d/c.              Difficult to place patient: No    Christel Mormon M.D on 12/12/2021 at 9:24 PM  [facility]   From 7 PM-7 AM, contact night-coverage [email or web address]  CC: Primary care physician; Lequita Asal, MD (Inactive)

## 2021-12-12 NOTE — ED Triage Notes (Signed)
Presents via EMS from home  pt was found on floor by family today  pt has hx of dementia  family states she may have fallen yesterday  pos deformity noted to right hip

## 2021-12-13 ENCOUNTER — Inpatient Hospital Stay: Payer: Medicare HMO

## 2021-12-13 ENCOUNTER — Inpatient Hospital Stay: Payer: Medicare HMO | Admitting: Anesthesiology

## 2021-12-13 ENCOUNTER — Encounter
Admission: EM | Disposition: A | Discharge status: Skilled Nursing Facility | Payer: Self-pay | Source: Home / Self Care | Attending: Internal Medicine

## 2021-12-13 DIAGNOSIS — S72001A Fracture of unspecified part of neck of right femur, initial encounter for closed fracture: Secondary | ICD-10-CM | POA: Diagnosis not present

## 2021-12-13 HISTORY — PX: INTRAMEDULLARY (IM) NAIL INTERTROCHANTERIC: SHX5875

## 2021-12-13 LAB — BASIC METABOLIC PANEL
Anion gap: 6 (ref 5–15)
BUN: 35 mg/dL — ABNORMAL HIGH (ref 8–23)
CO2: 26 mmol/L (ref 22–32)
Calcium: 9.3 mg/dL (ref 8.9–10.3)
Chloride: 107 mmol/L (ref 98–111)
Creatinine, Ser: 1.12 mg/dL — ABNORMAL HIGH (ref 0.44–1.00)
GFR, Estimated: 51 mL/min — ABNORMAL LOW (ref >60)
Glucose, Bld: 108 mg/dL — ABNORMAL HIGH (ref 70–99)
Potassium: 4.1 mmol/L (ref 3.5–5.1)
Sodium: 139 mmol/L (ref 135–145)

## 2021-12-13 LAB — CBC
HCT: 32.1 % — ABNORMAL LOW (ref 36.0–46.0)
Hemoglobin: 10.4 g/dL — ABNORMAL LOW (ref 12.0–15.0)
MCH: 30.3 pg (ref 26.0–34.0)
MCHC: 32.4 g/dL (ref 30.0–36.0)
MCV: 93.6 fL (ref 80.0–100.0)
Platelets: 98 10*3/uL — ABNORMAL LOW (ref 150–400)
RBC: 3.43 MIL/uL — ABNORMAL LOW (ref 3.87–5.11)
RDW: 13.4 % (ref 11.5–15.5)
WBC: 9.5 10*3/uL (ref 4.0–10.5)
nRBC: 0 % (ref 0.0–0.2)

## 2021-12-13 LAB — CK
Total CK: 1582 U/L — ABNORMAL HIGH (ref 38–234)
Total CK: 980 U/L — ABNORMAL HIGH (ref 38–234)

## 2021-12-13 SURGERY — FIXATION, FRACTURE, INTERTROCHANTERIC, WITH INTRAMEDULLARY ROD
Anesthesia: General | Site: Hip | Laterality: Right

## 2021-12-13 MED ORDER — EPHEDRINE SULFATE 50 MG/ML IJ SOLN
INTRAMUSCULAR | Status: DC | PRN
  Administered 2021-12-13: 5 mg via INTRAVENOUS
  Administered 2021-12-13: 10 mg via INTRAVENOUS
  Administered 2021-12-13: 5 mg via INTRAVENOUS
  Administered 2021-12-13 (×2): 10 mg via INTRAVENOUS

## 2021-12-13 MED ORDER — TRAZODONE HCL 50 MG PO TABS: 25.0000 mg | ORAL_TABLET | Freq: Every evening | ORAL | Status: DC | PRN

## 2021-12-13 MED ORDER — MORPHINE SULFATE (PF) 2 MG/ML IV SOLN
2.0000 mg | INTRAVENOUS | Status: DC | PRN
  Administered 2021-12-13: 05:00:00 2 mg via INTRAVENOUS
  Filled 2021-12-13 (×2): qty 1

## 2021-12-13 MED ORDER — CHLORHEXIDINE GLUCONATE CLOTH 2 % EX PADS
6.0000 | MEDICATED_PAD | Freq: Every day | CUTANEOUS | Status: DC
  Administered 2021-12-13 – 2021-12-31 (×15): 6 via TOPICAL

## 2021-12-13 MED ORDER — FENTANYL CITRATE (PF) 100 MCG/2ML IJ SOLN: 25.0000 ug | INTRAMUSCULAR | Status: DC | PRN

## 2021-12-13 MED ORDER — DEXAMETHASONE SODIUM PHOSPHATE 10 MG/ML IJ SOLN
INTRAMUSCULAR | Status: DC | PRN
  Administered 2021-12-13: 5 mg via INTRAVENOUS

## 2021-12-13 MED ORDER — ALBUTEROL SULFATE HFA 108 (90 BASE) MCG/ACT IN AERS: 2.0000 | INHALATION_SPRAY | RESPIRATORY_TRACT | Status: DC | PRN

## 2021-12-13 MED ORDER — ROSUVASTATIN CALCIUM 10 MG PO TABS
40.0000 mg | ORAL_TABLET | Freq: Every day | ORAL | Status: DC
  Administered 2021-12-14 – 2021-12-24 (×11): 40 mg via ORAL
  Filled 2021-12-13 (×11): qty 4

## 2021-12-13 MED ORDER — KETAMINE HCL 50 MG/5ML IJ SOSY
PREFILLED_SYRINGE | INTRAMUSCULAR | Status: AC
  Filled 2021-12-13: qty 5

## 2021-12-13 MED ORDER — VITAMIN D 25 MCG (1000 UNIT) PO TABS
2000.0000 [IU] | ORAL_TABLET | Freq: Every day | ORAL | Status: DC
Start: 2021-12-13 — End: 2021-12-31
  Administered 2021-12-14 – 2021-12-31 (×17): 2000 [IU] via ORAL
  Filled 2021-12-13 (×18): qty 2

## 2021-12-13 MED ORDER — LORAZEPAM 0.5 MG PO TABS
0.5000 mg | ORAL_TABLET | ORAL | Status: DC | PRN
  Administered 2021-12-13 – 2021-12-14 (×2): 0.5 mg via ORAL
  Filled 2021-12-13 (×2): qty 1

## 2021-12-13 MED ORDER — HYDRALAZINE HCL 50 MG PO TABS
50.0000 mg | ORAL_TABLET | Freq: Every day | ORAL | Status: DC
  Administered 2021-12-14: 10:00:00 50 mg via ORAL
  Filled 2021-12-13: qty 1

## 2021-12-13 MED ORDER — SUGAMMADEX SODIUM 200 MG/2ML IV SOLN
INTRAVENOUS | Status: DC | PRN
  Administered 2021-12-13: 200 mg via INTRAVENOUS

## 2021-12-13 MED ORDER — BACITRACIN ZINC 500 UNIT/GM EX OINT
TOPICAL_OINTMENT | CUTANEOUS | Status: AC
  Filled 2021-12-13: qty 28.35

## 2021-12-13 MED ORDER — MAGNESIUM HYDROXIDE 400 MG/5ML PO SUSP
30.0000 mL | Freq: Every day | ORAL | Status: DC | PRN
  Administered 2021-12-22 – 2021-12-28 (×3): 30 mL via ORAL
  Filled 2021-12-13 (×3): qty 30

## 2021-12-13 MED ORDER — DEXMEDETOMIDINE HCL IN NACL 200 MCG/50ML IV SOLN
INTRAVENOUS | Status: DC | PRN
  Administered 2021-12-13: 12 ug via INTRAVENOUS

## 2021-12-13 MED ORDER — METHOCARBAMOL 1000 MG/10ML IJ SOLN
500.0000 mg | Freq: Four times a day (QID) | INTRAVENOUS | Status: DC | PRN
  Filled 2021-12-13: qty 5

## 2021-12-13 MED ORDER — ONDANSETRON HCL 4 MG/2ML IJ SOLN
INTRAMUSCULAR | Status: AC
  Filled 2021-12-13: qty 2

## 2021-12-13 MED ORDER — CEFAZOLIN SODIUM-DEXTROSE 2-4 GM/100ML-% IV SOLN
INTRAVENOUS | Status: AC
  Filled 2021-12-13: qty 100

## 2021-12-13 MED ORDER — MORPHINE SULFATE (PF) 2 MG/ML IV SOLN
0.5000 mg | INTRAVENOUS | Status: DC | PRN
  Administered 2021-12-14: 01:00:00 1 mg via INTRAVENOUS
  Filled 2021-12-13: qty 1

## 2021-12-13 MED ORDER — 0.9 % SODIUM CHLORIDE (POUR BTL) OPTIME
TOPICAL | Status: DC | PRN
  Administered 2021-12-13: 15:00:00 500 mL

## 2021-12-13 MED ORDER — ACETAMINOPHEN 650 MG RE SUPP: 650.0000 mg | Freq: Four times a day (QID) | RECTAL | Status: DC | PRN

## 2021-12-13 MED ORDER — BISACODYL 10 MG RE SUPP
10.0000 mg | Freq: Every day | RECTAL | Status: DC | PRN
  Administered 2021-12-28: 10 mg via RECTAL
  Filled 2021-12-13: qty 1

## 2021-12-13 MED ORDER — PROPOFOL 10 MG/ML IV BOLUS
INTRAVENOUS | Status: DC | PRN
  Administered 2021-12-13: 40 mg via INTRAVENOUS
  Administered 2021-12-13: 20 mg via INTRAVENOUS

## 2021-12-13 MED ORDER — PHENYLEPHRINE HCL-NACL 20-0.9 MG/250ML-% IV SOLN
INTRAVENOUS | Status: DC | PRN
  Administered 2021-12-13: 75 ug/min via INTRAVENOUS

## 2021-12-13 MED ORDER — IOHEXOL 350 MG/ML SOLN
75.0000 mL | Freq: Once | INTRAVENOUS | Status: AC | PRN
  Administered 2021-12-13: 02:00:00 75 mL via INTRAVENOUS
  Filled 2021-12-13: qty 75

## 2021-12-13 MED ORDER — ONDANSETRON HCL 4 MG/2ML IJ SOLN: 4.0000 mg | Freq: Once | INTRAMUSCULAR | Status: DC | PRN

## 2021-12-13 MED ORDER — ACETAMINOPHEN 10 MG/ML IV SOLN
INTRAVENOUS | Status: DC | PRN
  Administered 2021-12-13: 1000 mg via INTRAVENOUS

## 2021-12-13 MED ORDER — ALBUTEROL SULFATE (2.5 MG/3ML) 0.083% IN NEBU: 2.5000 mg | INHALATION_SOLUTION | RESPIRATORY_TRACT | Status: DC | PRN

## 2021-12-13 MED ORDER — ACETAMINOPHEN 10 MG/ML IV SOLN
INTRAVENOUS | Status: AC
  Filled 2021-12-13: qty 100

## 2021-12-13 MED ORDER — ONDANSETRON HCL 4 MG/2ML IJ SOLN: 4.0000 mg | Freq: Four times a day (QID) | INTRAMUSCULAR | Status: DC | PRN

## 2021-12-13 MED ORDER — FENTANYL CITRATE (PF) 100 MCG/2ML IJ SOLN
INTRAMUSCULAR | Status: AC
  Filled 2021-12-13: qty 2

## 2021-12-13 MED ORDER — SUCCINYLCHOLINE CHLORIDE 200 MG/10ML IV SOSY
PREFILLED_SYRINGE | INTRAVENOUS | Status: DC | PRN
  Administered 2021-12-13: 80 mg via INTRAVENOUS

## 2021-12-13 MED ORDER — TRAZODONE HCL 50 MG PO TABS
50.0000 mg | ORAL_TABLET | Freq: Every evening | ORAL | Status: DC | PRN
Start: 2021-12-12 — End: 2021-12-24
  Administered 2021-12-13 – 2021-12-15 (×3): 100 mg via ORAL
  Administered 2021-12-16: 50 mg via ORAL
  Administered 2021-12-17 – 2021-12-23 (×6): 100 mg via ORAL
  Filled 2021-12-13: qty 2
  Filled 2021-12-13: qty 1
  Filled 2021-12-13 (×9): qty 2

## 2021-12-13 MED ORDER — DOCUSATE SODIUM 100 MG PO CAPS
100.0000 mg | ORAL_CAPSULE | Freq: Two times a day (BID) | ORAL | Status: DC
  Administered 2021-12-13 – 2021-12-19 (×12): 100 mg via ORAL
  Filled 2021-12-13 (×12): qty 1

## 2021-12-13 MED ORDER — POLYETHYLENE GLYCOL 3350 17 G PO PACK
17.0000 g | PACK | Freq: Every day | ORAL | Status: DC | PRN
  Administered 2021-12-24: 17 g via ORAL
  Filled 2021-12-13: qty 1

## 2021-12-13 MED ORDER — ROCURONIUM BROMIDE 100 MG/10ML IV SOLN
INTRAVENOUS | Status: DC | PRN
  Administered 2021-12-13: 20 mg via INTRAVENOUS

## 2021-12-13 MED ORDER — ACETAMINOPHEN 500 MG PO TABS
500.0000 mg | ORAL_TABLET | Freq: Four times a day (QID) | ORAL | Status: AC
  Administered 2021-12-13 – 2021-12-14 (×2): 500 mg via ORAL
  Filled 2021-12-13 (×2): qty 1

## 2021-12-13 MED ORDER — SODIUM CHLORIDE 0.9 % IV SOLN: INTRAVENOUS | Status: DC

## 2021-12-13 MED ORDER — FENTANYL CITRATE (PF) 100 MCG/2ML IJ SOLN
INTRAMUSCULAR | Status: DC | PRN
  Administered 2021-12-13 (×2): 25 ug via INTRAVENOUS

## 2021-12-13 MED ORDER — TRAMADOL HCL 50 MG PO TABS
50.0000 mg | ORAL_TABLET | Freq: Four times a day (QID) | ORAL | Status: DC
  Administered 2021-12-13 – 2021-12-24 (×35): 50 mg via ORAL
  Filled 2021-12-13 (×36): qty 1

## 2021-12-13 MED ORDER — CARVEDILOL 6.25 MG PO TABS
6.2500 mg | ORAL_TABLET | Freq: Two times a day (BID) | ORAL | Status: DC
  Administered 2021-12-14: 10:00:00 6.25 mg via ORAL
  Filled 2021-12-13 (×2): qty 1

## 2021-12-13 MED ORDER — IPRATROPIUM BROMIDE 0.06 % NA SOLN
2.0000 | Freq: Three times a day (TID) | NASAL | Status: DC
  Administered 2021-12-14 – 2021-12-31 (×45): 2 via NASAL
  Filled 2021-12-13: qty 15

## 2021-12-13 MED ORDER — GLYCOPYRROLATE 0.2 MG/ML IJ SOLN
INTRAMUSCULAR | Status: AC
  Filled 2021-12-13: qty 1

## 2021-12-13 MED ORDER — BUPIVACAINE HCL (PF) 0.5 % IJ SOLN
INTRAMUSCULAR | Status: AC
  Filled 2021-12-13: qty 10

## 2021-12-13 MED ORDER — ONDANSETRON HCL 4 MG PO TABS: 4.0000 mg | ORAL_TABLET | Freq: Four times a day (QID) | ORAL | Status: DC | PRN

## 2021-12-13 MED ORDER — CEFAZOLIN SODIUM-DEXTROSE 2-4 GM/100ML-% IV SOLN
2.0000 g | Freq: Four times a day (QID) | INTRAVENOUS | Status: AC
  Administered 2021-12-13 – 2021-12-14 (×2): 2 g via INTRAVENOUS
  Filled 2021-12-13 (×2): qty 100

## 2021-12-13 MED ORDER — PROPOFOL 500 MG/50ML IV EMUL
INTRAVENOUS | Status: DC | PRN
  Administered 2021-12-13: 50 ug/kg/min via INTRAVENOUS

## 2021-12-13 MED ORDER — DEXAMETHASONE SODIUM PHOSPHATE 10 MG/ML IJ SOLN
INTRAMUSCULAR | Status: AC
  Filled 2021-12-13: qty 1

## 2021-12-13 MED ORDER — ACETAMINOPHEN 325 MG PO TABS
650.0000 mg | ORAL_TABLET | Freq: Four times a day (QID) | ORAL | Status: DC | PRN
  Administered 2021-12-23: 650 mg via ORAL
  Filled 2021-12-13: qty 2

## 2021-12-13 MED ORDER — EPHEDRINE 5 MG/ML INJ
INTRAVENOUS | Status: AC
  Filled 2021-12-13: qty 10

## 2021-12-13 MED ORDER — ALUM & MAG HYDROXIDE-SIMETH 200-200-20 MG/5ML PO SUSP: 30.0000 mL | ORAL | Status: DC | PRN

## 2021-12-13 MED ORDER — SENNA 8.6 MG PO TABS
1.0000 | ORAL_TABLET | Freq: Two times a day (BID) | ORAL | Status: DC
  Administered 2021-12-14 – 2021-12-31 (×35): 8.6 mg via ORAL
  Filled 2021-12-13 (×37): qty 1

## 2021-12-13 MED ORDER — ACETAMINOPHEN 10 MG/ML IV SOLN: 1000.0000 mg | Freq: Once | INTRAVENOUS | Status: DC | PRN

## 2021-12-13 MED ORDER — ENOXAPARIN SODIUM 30 MG/0.3ML IJ SOSY
30.0000 mg | PREFILLED_SYRINGE | INTRAMUSCULAR | Status: DC
  Administered 2021-12-14 – 2021-12-19 (×6): 30 mg via SUBCUTANEOUS
  Filled 2021-12-13 (×6): qty 0.3

## 2021-12-13 MED ORDER — HYDROCODONE-ACETAMINOPHEN 5-325 MG PO TABS
1.0000 | ORAL_TABLET | ORAL | Status: DC | PRN
  Administered 2021-12-14 – 2021-12-18 (×3): 1 via ORAL
  Administered 2021-12-18: 2 via ORAL
  Administered 2021-12-18: 1 via ORAL
  Filled 2021-12-13 (×2): qty 1
  Filled 2021-12-13: qty 2
  Filled 2021-12-13 (×2): qty 1

## 2021-12-13 MED ORDER — KETAMINE HCL 50 MG/ML IJ SOLN
INTRAMUSCULAR | Status: DC | PRN
  Administered 2021-12-13: 10 mg via INTRAVENOUS
  Administered 2021-12-13 (×2): 20 mg via INTRAVENOUS

## 2021-12-13 MED ORDER — METHOCARBAMOL 500 MG PO TABS
500.0000 mg | ORAL_TABLET | Freq: Four times a day (QID) | ORAL | Status: DC | PRN
  Administered 2021-12-17 – 2021-12-28 (×10): 500 mg via ORAL
  Filled 2021-12-13 (×10): qty 1

## 2021-12-13 MED ORDER — LOSARTAN POTASSIUM 50 MG PO TABS
100.0000 mg | ORAL_TABLET | Freq: Every day | ORAL | Status: DC
  Administered 2021-12-14: 10:00:00 100 mg via ORAL
  Filled 2021-12-13: qty 2

## 2021-12-13 MED ORDER — ONDANSETRON HCL 4 MG/2ML IJ SOLN
INTRAMUSCULAR | Status: DC | PRN
  Administered 2021-12-13: 4 mg via INTRAVENOUS

## 2021-12-13 MED ORDER — MELATONIN 5 MG PO TABS
2.5000 mg | ORAL_TABLET | Freq: Every evening | ORAL | Status: DC | PRN
  Administered 2021-12-13 – 2021-12-23 (×4): 2.5 mg via ORAL
  Filled 2021-12-13 (×5): qty 1

## 2021-12-13 MED ORDER — EPHEDRINE 5 MG/ML INJ
INTRAVENOUS | Status: AC
  Filled 2021-12-13: qty 5

## 2021-12-13 SURGICAL SUPPLY — 48 items
BIT DRILL CROWE POINT TWST 4.3 (DRILL) IMPLANT
BNDG COHESIVE 6X5 TAN ST LF (GAUZE/BANDAGES/DRESSINGS) ×4 IMPLANT
DRAPE 3/4 80X56 (DRAPES) ×4 IMPLANT
DRAPE SURG 17X11 SM STRL (DRAPES) ×4 IMPLANT
DRAPE U-SHAPE 47X51 STRL (DRAPES) ×3 IMPLANT
DRILL CROWE POINT TWIST 4.3 (DRILL) ×2
DRSG OPSITE POSTOP 3X4 (GAUZE/BANDAGES/DRESSINGS) ×4 IMPLANT
DRSG OPSITE POSTOP 4X14 (GAUZE/BANDAGES/DRESSINGS) IMPLANT
DRSG OPSITE POSTOP 4X6 (GAUZE/BANDAGES/DRESSINGS) ×1 IMPLANT
DURAPREP 26ML APPLICATOR (WOUND CARE) ×4 IMPLANT
ELECT REM PT RETURN 9FT ADLT (ELECTROSURGICAL) ×2
ELECTRODE REM PT RTRN 9FT ADLT (ELECTROSURGICAL) ×1 IMPLANT
GAUZE 4X4 16PLY ~~LOC~~+RFID DBL (SPONGE) ×2 IMPLANT
GLOVE SURG ORTHO LTX SZ9 (GLOVE) ×4 IMPLANT
GLOVE SURG UNDER POLY LF SZ9 (GLOVE) ×2 IMPLANT
GOWN STRL REUS TWL 2XL XL LVL4 (GOWN DISPOSABLE) ×2 IMPLANT
GOWN STRL REUS W/ TWL LRG LVL3 (GOWN DISPOSABLE) ×1 IMPLANT
GOWN STRL REUS W/TWL LRG LVL3 (GOWN DISPOSABLE) ×1
GUIDEPIN VERSANAIL DSP 3.2X444 (ORTHOPEDIC DISPOSABLE SUPPLIES) ×1 IMPLANT
GUIDEWIRE 3.0X100MM BALL TIP (WIRE) ×1 IMPLANT
GUIDEWIRE HUMERL  BALL TIP 2.4 (WIRE) ×1
GUIDEWIRE HUMERL BALL TIP 2.4 (WIRE) IMPLANT
HEMOVAC 400CC 10FR (MISCELLANEOUS) IMPLANT
HFN RH 130 DEG 9MM X 360MM (Nail) ×1 IMPLANT
KIT TURNOVER CYSTO (KITS) ×2 IMPLANT
MANIFOLD NEPTUNE II (INSTRUMENTS) ×2 IMPLANT
MAT ABSORB  FLUID 56X50 GRAY (MISCELLANEOUS) ×1
MAT ABSORB FLUID 56X50 GRAY (MISCELLANEOUS) ×1 IMPLANT
NS IRRIG 1000ML POUR BTL (IV SOLUTION) ×1 IMPLANT
NS IRRIG 500ML POUR BTL (IV SOLUTION) ×1 IMPLANT
PACK HIP COMPR (MISCELLANEOUS) ×2 IMPLANT
PAD ARMBOARD 7.5X6 YLW CONV (MISCELLANEOUS) ×3 IMPLANT
SCREW BONE CORTICAL 5.0X3 (Screw) ×1 IMPLANT
SCREW BONE CORTICAL 5.0X36 (Screw) ×1 IMPLANT
SCREW BONE CORTICAL 5.0X42 (Screw) ×1 IMPLANT
SCREW LAG HIP NAIL 10.5X95 (Screw) ×1 IMPLANT
SPONGE T-LAP 18X18 ~~LOC~~+RFID (SPONGE) ×4 IMPLANT
STAPLER SKIN PROX 35W (STAPLE) ×2 IMPLANT
SUCTION FRAZIER HANDLE 10FR (MISCELLANEOUS) ×1
SUCTION TUBE FRAZIER 10FR DISP (MISCELLANEOUS) ×1 IMPLANT
SUT VIC AB 0 CT1 36 (SUTURE) ×3 IMPLANT
SUT VIC AB 2-0 CT1 27 (SUTURE) ×1
SUT VIC AB 2-0 CT1 TAPERPNT 27 (SUTURE) ×1 IMPLANT
SUT VICRYL 0 AB UR-6 (SUTURE) ×2 IMPLANT
SYR 30ML LL (SYRINGE) ×2 IMPLANT
TRAY FOLEY MTR SLVR 16FR STAT (SET/KITS/TRAYS/PACK) ×1 IMPLANT
WATER STERILE IRR 500ML POUR (IV SOLUTION) ×2 IMPLANT
smooth guide wire bullet tip, 3.0mm diameter ×1 IMPLANT

## 2021-12-13 NOTE — Op Note (Signed)
12/13/2021  3:54 PM  PATIENT:  Morgan Stewart    PRE-OPERATIVE DIAGNOSIS: Displaced right intertrochanteric hip fracture  POST-OPERATIVE DIAGNOSIS:  Same  PROCEDURE: Intramedullary fixation of right intertrochanteric hip fracture  SURGEON:  Thornton Park, MD  ANESTHESIA:   General  EBL: 100  IMPLANT:  ZIMMER BIOMET AFFIXUS NAIL 9 mm x 360 mm with a 95 mm lag screw and distal interlocking screws 42 mm  and 36 mm in length.  PREOPERATIVE INDICATIONS:  Morgan Stewart is a  75 y.o. female with a diagnosis of intertrochanteric hip fracture requiring intramedullary fixation  Given the patient's history of dementia, details of the operation and postoperative course as well as the risks, benefits were discussed with the patient's niece who is her power of attorney .  The risks include but are not limited to infection, bleeding requiring blood transfusion, nerve or blood vessel injury, malunion, nonunion, hardware prominence, hardware failure, leg length discrepancy or change in lower extremity rotation and need for further surgery including hardware removal with conversion to a total hip arthroplasty. Medical risks include but are not limited to DVT and pulmonary embolism, myocardial infarction, stroke, pneumonia, respiratory failure and death. The patient's niece understood these risks and wished to proceed with surgery.  OPERATIVE PROCEDURE:  The patient was brought to the operating room and placed in the supine position on the fracture table. The patient received general anesthesia.  A closed reduction was performed under C-arm guidance.  The fracture reduction was confirmed on both AP and lateral views. After adequate reduction was achieved, a time out was performed to verify the patient's name, date of birth, medical record number, correct site of surgery correct procedure to be performed. The timeout was also used to verify the patient received antibiotics and all appropriate  instruments, implants and radiographic studies were available in the room. Once all in attendance were in agreement, the case began. The patient was prepped and draped in a sterile fashion.  The patient received preoperative antibiotics with 2 g of Ancef IV.  An incision was made proximal to the greater trochanter in line with the femur. A guidewire was placed over the tip of the greater trochanter and advanced by drill into the proximal femur to the level of the lesser trochanter.  Confirmation of the drill pin position was made on AP and lateral C-arm images.  The threaded guidepin was then overdrilled with the proximal femoral entry reamer.  A ball-tipped guidewire was then advanced down the intramedullary canal, across the fracture, and down the femoral shaft to the knee.  The ball tip guidewire's position was confirmed at both the knee and hip via C-arm imaging. A depth gauge was used to measure the length of the long nail to be used. It was measured to be 360 mm.  Sequential reamers were used up to 11 mm.  The decision was made to use a 9 mm nail.  The actual nail was then inserted into the proximal femur, across the fracture site and down the femoral shaft. Its position was confirmed on AP and lateral C-arm images.  The ball tip guidewire was removed.  Once the nail was completely seated, the drill guide for the lag screw was placed through the guide arm for the Affixus nail. A guidepin was then placed through this drill guide and advanced through the lateral cortex of the femur, across the fracture site and into the femoral head achieving a tip apex distance of less than 25 mm. The length of  the drill pin was measured to be 95 mm, and then the drill for the lag screw was advanced through the lateral cortex, across the fracture site and up into the femoral head to the depth of the lag screw..  The lag screw was then advanced by hand into position across the fracture site into the femoral head. Its final  position was confirmed on AP and lateral C-arm images. Compression was applied as traction was carefully released. The set screw in the top of the intramedullary rod was tightened by hand using a screwdriver. It was backed off a quarter turn to allow for compression at the fracture site.  The attention was then turned to placement of the distal interlocking screws. A perfect circle technique was used. two small stab incisions were made over the distal interlocking screw holes.  A free hand technique was used to drill both distal interlocking screws. The depth of the screw holes was measured with a depth gauge. The 40mm and 5mm screws were then advanced into position and tightened by hand. Final C-arm images of the entire intramedullary construct were taken in both the AP and lateral planes.   The wounds were irrigated copiously and closed with 0 Vicryl for closure of the deep fascia and 2-0 Vicryl for subcutaneous closure. The skin was approximated with staples. A dry sterile dressing was applied. I was scrubbed and present the entire case and all sharp, sponge and instrument counts were correct at the conclusion of the case. Patient was transferred to a hospital bed and brought to PACU in stable condition.     Morgan Gaul, MD

## 2021-12-13 NOTE — Anesthesia Procedure Notes (Signed)
Procedure Name: Intubation Date/Time: 12/13/2021 2:00 PM Performed by: Rolla Plate, CRNA Pre-anesthesia Checklist: Patient identified, Patient being monitored, Timeout performed, Emergency Drugs available and Suction available Patient Re-evaluated:Patient Re-evaluated prior to induction Oxygen Delivery Method: Circle system utilized Preoxygenation: Pre-oxygenation with 100% oxygen Induction Type: IV induction and Rapid sequence Laryngoscope Size: 3 and McGraph Grade View: Grade I Tube type: Oral Tube size: 6.5 mm Number of attempts: 1 Airway Equipment and Method: Stylet and Video-laryngoscopy Placement Confirmation: ETT inserted through vocal cords under direct vision, positive ETCO2 and breath sounds checked- equal and bilateral Secured at: 21 cm Tube secured with: Tape Dental Injury: Teeth and Oropharynx as per pre-operative assessment

## 2021-12-13 NOTE — Consult Note (Signed)
ORTHOPAEDIC CONSULTATION  REQUESTING PHYSICIAN: Little Ishikawa, MD  Chief Complaint: Right hip pain that is post fall  HPI: Morgan Stewart is a 75 y.o. female with dementia who was brought to the [facility] regional emergency department overnight after an unwitnessed fall.  Patient lives independently despite her dementia.  Patient was found yesterday by her family and it is believed the patient may have been down on the ground for a few days after her fall. Patient presents with an elevated CK consistent with rhabdomyolysis.  Patient is unable to provide an accurate history when I saw her this morning in the ER at 8 AM and again in the preoperative area now.  She is wearing mitts to prevent pulling out her IV.  Past Medical History:  Diagnosis Date   Cancer ([place])    cervical   Past Surgical History:  Procedure Laterality Date   ABDOMINAL HYSTERECTOMY     BREAST BIOPSY Left    bx/clip-neg   Social History   Socioeconomic History   Marital status: Divorced    Spouse name: Not on file   Number of children: Not on file   Years of education: Not on file   Highest education level: Not on file  Occupational History   Not on file  Tobacco Use   Smoking status: Every Day    Packs/day: 1.00    Years: 55.00    Pack years: 55.00    Types: Cigarettes   Smokeless tobacco: Never   Tobacco comments:    .5ppd currently  Vaping Use   Vaping Use: Never used  Substance and Sexual Activity   Alcohol use: Not on file   Drug use: Not on file   Sexual activity: Not on file  Other Topics Concern   Not on file  Social History Narrative   Not on file   Social Determinants of Health   Financial Resource Strain: Not on file  Food Insecurity: Not on file  Transportation Needs: Not on file  Physical Activity: Not on file  Stress: Not on file  Social Connections: Not on file   Family History  Problem Relation Age of Onset   Breast cancer Sister 30   Allergies  Allergen  Reactions   Ace Inhibitors Hives, Other (See Comments) and Rash    Pt is not sure of reaction  Pt is not sure of reaction     Doxycycline Other (See Comments)    Pt is not sure of reaction  Pt is not sure of reaction     Codeine Nausea Only   Orlistat Rash and Other (See Comments)    Pt is not sure of reaction  Pt is not sure of reaction     Oxycodone Nausea And Vomiting   Oxycodone-Acetaminophen Nausea Only   Sulfa Antibiotics Other (See Comments), Rash and Hives    Lowering serum sodium levels Lowering serum sodium levels  Lowering serum sodium levels Lowering serum sodium levels   Prior to Admission medications   Medication Sig Start Date End Date Taking? Authorizing Provider  hydrALAZINE (APRESOLINE) 25 MG tablet Take 25 mg by mouth 2 (two) times daily. 11/10/17  Yes [provider]  rosuvastatin (CRESTOR) 40 MG tablet Take 40 mg by mouth daily. 11/10/17  Yes [provider]  traZODone (DESYREL) 50 MG tablet Take 50-100 mg by mouth at bedtime as needed. 11/16/19  Yes [provider]  Cholecalciferol 50 MCG (2000 UT) CAPS Take by mouth. Patient not taking: Reported on 12/13/2021  [provider]  ibuprofen (ADVIL) 600 MG tablet Take by mouth. Patient not taking: Reported on 12/13/2021    [provider]  Melatonin 1 MG TABS Take by mouth. Patient not taking: Reported on 12/13/2021    [provider]  Nebulizers (DEVILBISS DISPOSABLE NEBULIZER) MISC Use nebulizer every 6 hours as needed for wheezing. 11/15/14   [provider]  venlafaxine XR (EFFEXOR-XR) 150 MG 24 hr capsule Take by mouth. 12/18/18 03/21/20  [provider]   DG Chest 1 View  Result Date: 12/12/2021 CLINICAL DATA:  Right hip pain EXAM: CHEST  1 VIEW COMPARISON:  Chest CT dated June 20, 2020 FINDINGS: Cardiac and mediastinal contours within normal limits. Elevation of the left hemidiaphragm. Lungs are clear. No large pleural effusion or  evidence pneumothorax. IMPRESSION: No active disease. Electronically Signed   By: Yetta Glassman M.D.   On: 12/12/2021 14:57   CT Head Wo Contrast  Result Date: 12/12/2021 CLINICAL DATA:  Unwitnessed fall. EXAM: CT HEAD WITHOUT CONTRAST CT CERVICAL SPINE WITHOUT CONTRAST TECHNIQUE: Multidetector CT imaging of the head and cervical spine was performed following the standard protocol without intravenous contrast. Multiplanar CT image reconstructions of the cervical spine were also generated. COMPARISON:  None. FINDINGS: CT HEAD FINDINGS Brain: Mild chronic ischemic white matter disease is noted. No mass effect or midline shift is noted. Ventricular size is within normal limits. There is no evidence of mass lesion, hemorrhage or acute infarction. Vascular: No hyperdense vessel or unexpected calcification. Skull: Normal. Negative for fracture or focal lesion. Sinuses/Orbits: No acute finding. Other: None. CT CERVICAL SPINE FINDINGS Alignment: Mild grade 1 anterolisthesis of C3-4 and C4-5 is noted secondary to posterior facet joint hypertrophy. Skull base and vertebrae: No acute fracture. No primary bone lesion or focal pathologic process. Soft tissues and spinal canal: No prevertebral fluid or swelling. No visible canal hematoma. Disc levels: Severe degenerative disc disease is noted at C5-6 and C6-7 with anterior posterior osteophyte formation. Upper chest: Negative. Other: Degenerative changes are seen involving posterior facet joints bilaterally. IMPRESSION: No acute intracranial abnormality seen. Severe multilevel degenerative disc disease. No acute abnormality seen in the cervical spine. Electronically Signed   By: Marijo Conception M.D.   On: 12/12/2021 15:23   CT Angio Chest Pulmonary Embolism (PE) W or WO Contrast  Result Date: 12/13/2021 CLINICAL DATA:  Syncopal episode with hypoxia. EXAM: CT ANGIOGRAPHY CHEST WITH CONTRAST TECHNIQUE: Multidetector CT imaging of the chest was performed using the  standard protocol during bolus administration of intravenous contrast. Multiplanar CT image reconstructions and MIPs were obtained to evaluate the vascular anatomy. CONTRAST:  52mL OMNIPAQUE IOHEXOL 350 MG/ML SOLN COMPARISON:  Portable chest today, chest CT without contrast 06/20/2020. FINDINGS: Factors affecting image quality: Abundant respiratory motion. Cardiovascular: Pulmonary arteries are normal in caliber. There is a suspected segmental arterial filling defect suspicious for thrombus the medial segment of the right middle lobe on series 4 axial images 50 and 51. No other arterial filling defect is seen but the subsegmental arteries are obscured by abundant respiratory motion. This would be a very small embolic burden if truly present. The cardiac size is normal. Coronary arteries are heavily calcified. There is no pericardial effusion. There is lipomatous hypertrophy of the intra-atrial septum. There is aortic and great vessel atherosclerosis without dissection, moderate descending segment tortuosity, and slightly aneurysmal ascending segment measuring 4.2 cm. The remainder is within normal caliber limits. Mediastinum/Nodes: No enlarged mediastinal, hilar, or axillary lymph nodes. Thyroid gland, trachea, and esophagus  demonstrate no significant findings. Lungs/Pleura: There is mild central bronchial thickening in the lower lobes consistent with bronchitis. This was previously more pronounced. The lungs are mildly emphysematous with centrilobular changes predominating. No active lung infiltrate is seen. A few tiny noncalcified lung base nodules are again noted up to 5 mm. All are unchanged. There are areas of chronic scarring in the lower lung fields. There is no pleural effusion or pneumothorax. Mild chronic elevation left hemidiaphragm. Upper Abdomen: Gallbladder is distended and contains stones measuring up to 2.7 cm. No biliary dilatation. Postsurgical changes of the stomach are chronically seen.  Musculoskeletal: There is osteopenia and degenerative changes of the spine. No focal osseous lesion in the thorax. Review of the MIP images confirms the above findings. IMPRESSION: 1. Study is motion-limited. There is a questionable medial segmental right middle lobe arterial filling defect on 2 images. There is no evidence of right heart strain or further visible embolus, but the subsegmental arterial bed is obscured due to breathing motion. 2. 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 Aortic atherosclerosis. 3. Pulmonary emphysema, with several scattered subcentimeter lung base nodules, unchanged, findings of at least mild lower lobe bronchitis without evidence of pneumonia. 4. Cholelithiasis with distended gallbladder without visible wall thickening. The gallbladder incompletely visualized and not well seen due to breathing motion. 5. Osteopenia and degenerative change. 6. Calcific CAD.  Lipomatous hypertrophy interatrial septum. Electronically Signed   By: Telford Nab M.D.   On: 12/13/2021 03:36   CT Cervical Spine Wo Contrast  Result Date: 12/12/2021 CLINICAL DATA:  Unwitnessed fall. EXAM: CT HEAD WITHOUT CONTRAST CT CERVICAL SPINE WITHOUT CONTRAST TECHNIQUE: Multidetector CT imaging of the head and cervical spine was performed following the standard protocol without intravenous contrast. Multiplanar CT image reconstructions of the cervical spine were also generated. COMPARISON:  None. FINDINGS: CT HEAD FINDINGS Brain: Mild chronic ischemic white matter disease is noted. No mass effect or midline shift is noted. Ventricular size is within normal limits. There is no evidence of mass lesion, hemorrhage or acute infarction. Vascular: No hyperdense vessel or unexpected calcification. Skull: Normal. Negative for fracture or focal lesion. Sinuses/Orbits: No acute finding. Other: None. CT CERVICAL SPINE FINDINGS Alignment: Mild grade 1 anterolisthesis of C3-4 and C4-5 is noted secondary to posterior facet  joint hypertrophy. Skull base and vertebrae: No acute fracture. No primary bone lesion or focal pathologic process. Soft tissues and spinal canal: No prevertebral fluid or swelling. No visible canal hematoma. Disc levels: Severe degenerative disc disease is noted at C5-6 and C6-7 with anterior posterior osteophyte formation. Upper chest: Negative. Other: Degenerative changes are seen involving posterior facet joints bilaterally. IMPRESSION: No acute intracranial abnormality seen. Severe multilevel degenerative disc disease. No acute abnormality seen in the cervical spine. Electronically Signed   By: Marijo Conception M.D.   On: 12/12/2021 15:23   CT Hip Right Wo Contrast  Result Date: 12/12/2021 CLINICAL DATA:  [place].  Hip fracture. EXAM: CT OF THE RIGHT HIP WITHOUT CONTRAST TECHNIQUE: Multidetector CT imaging of the right hip was performed according to the standard protocol. Multiplanar CT image reconstructions were also generated. COMPARISON:  Radiographs, same date. FINDINGS: There is a complex comminuted intertrochanteric fracture of the right hip with moderate impaction. The femoral head neck are intact. No acetabular fracture. The visualized right hemipelvic bony structures are intact. Chondrocalcinosis noted at the pubic symphysis. Associated soft tissue swelling and intramuscular hemorrhage involving the right hip and thigh muscles. Benign-appearing subcutaneous lipoma noted overlying the right hip musculature. IMPRESSION:  1. Complex comminuted intertrochanteric fracture of the right hip with moderate impaction. 2. Associated soft tissue swelling and intramuscular hemorrhage involving the right hip and thigh muscles. Electronically Signed   By: Marijo Sanes M.D.   On: 12/12/2021 22:02   DG Hip Unilat With Pelvis 2-3 Views Right  Result Date: 12/12/2021 CLINICAL DATA:  RIGHT leg pain, deformity and dementia. EXAM: DG HIP (WITH OR WITHOUT PELVIS) 2-3V RIGHT COMPARISON:  None FINDINGS: Over riding,  fracture with both Verus and apex anterior angulation involving the intratrochanteric RIGHT femur and associated with mild comminution. Femoral head is located. LEFT hip unremarkable on AP projection. No sign of fracture of the bony pelvis. Degenerative changes in the lower lumbar spine. IMPRESSION: Angulated, comminuted, and overriding intertrochanteric fracture of the RIGHT femur. Electronically Signed   By: Zetta Bills M.D.   On: 12/12/2021 14:58    Positive ROS: All other systems have been reviewed and were otherwise negative with the exception of those mentioned in the HPI and as above.  Physical Exam: General: Awake, confused but in no acute distress  MUSCULOSKELETAL: Right lower extremity: Patient skin is intact.  There is no erythema ecchymosis or significant swelling.  Her thigh and leg compartments are soft and compressible.  She has shortening and external rotation to the right lower extremity.  Patient has palpable pedal pulses.  It is difficult to assess her sensation and motor function due to her confusion.  Assessment: Displaced right intertrochanteric hip fracture  Plan: I called the patient's niece, Kerney Elbe, who is the patient's power of attorney.  I explained to Ms. Lissa Merlin about the patient's fracture.  I recommended intramedullary fixation for the fracture and reviewed the details of the operation as well as the postoperative course with her.  She was in agreement with the plan for surgery and understood the risks and benefits of surgery. The risks include but are not limited to infection, bleeding requiring blood transfusion, nerve or blood vessel injury, joint stiffness or loss of motion, persistent pain, weakness or instability, malunion, nonunion, change in lower extremity rotation, leg length discrepancy, hardware failure and the need for further surgery. Medical risks include but are not limited to DVT and pulmonary embolism, myocardial infarction, stroke, pneumonia,  respiratory failure and death. Patient understood these risks and wished to proceed.   Patient has been cleared by the hospitalist service for surgery.  I reviewed the patient's x-rays and CT scan in preparation for this case.    Thornton Park, MD    12/13/2021 1:33 PM

## 2021-12-13 NOTE — ED Notes (Signed)
Patient is disoriented to time. She is fidgety and removes medical devices. Son is at the bedside. Notified Dr.Mansy for about a sleep aid.

## 2021-12-13 NOTE — Progress Notes (Signed)
PROGRESS NOTE    Morgan Stewart  XNT:[id number] DOB: 1946-03-12 DOA: 12/12/2021 PCP: Lequita Asal, MD (Inactive)   Brief Narrative:  Morgan Stewart is a 75 y.o. Caucasian female with medical history significant for Coronary artery disease, hypertension, COPD, DJD, gout, dyslipidemia, vitamin D deficiency, vitamin B12 deficiency and tobacco abuse, presented to the ER with a [place] of right hip pain after having a fall last night.  It is unclear if she had a syncope or not.  She stated that she could not get up after falling -unwitnessed event as she lives independently at home.  In the ED imaging remarkable for right femur fracture, orthopedics consulted, hospitalist called for admission.  Assessment & Plan:  Closed right hip fracture secondary to presumed mechanical fall, POA Acute rhabdomyolysis, resolving Transient hypoxia secondary to narcotics, resolving -Orthopedics following, plan for ORIF later this morning -Pain currently well controlled, patient somnolent, transient event of hypoxia improving with weaning of narcotics -NPO, will transition back to regular diet postoperatively  Essential hypertension, uncontrolled in the setting of pain. -Continue carvedilol hydralazine and Cozaar -Blood pressure much more reasonable with pain control  Dyslipidemia -continue statin Depression continue Effexor Exar, trazodone  Coronary artery disease. W continue carvedilol, blood pressure control ; holding aspirin perioperatively.  DVT prophylaxis: SCDs. Code Status: full code. Family Communication:  The plan of care was discussed in details with the patient   Status is: Inpatient  Dispo: The patient is from: Home              Anticipated d/c is to: To be determined              Anticipated d/c date is: 48 to 72 hours              Patient currently not medically stable for discharge  Consultants:  Orthopedic surgery  Procedures:  ORIF right femur  12/13/2021  Antimicrobials:  None indicated  Subjective: No acute issues or events overnight, somnolent but easily arousable this morning review of systems limited due to patient's mental status the setting of narcotics  Objective: Vitals:   12/13/21 0318 12/13/21 0400 12/13/21 0600 12/13/21 0700  BP: (!) 137/105 112/73 108/82 (!) 126/98  Pulse: (!) 104   91  Resp: (!) 26 19 18 16   Temp:      TempSrc:      SpO2: 99% 98% 93% 93%  Weight:      Height:        Intake/Output Summary (Last 24 hours) at 12/13/2021 0815 Last data filed at 12/13/2021 0600 Gross per 24 hour  Intake 423.82 ml  Output --  Net 423.82 ml   Filed Weights   12/12/21 1420  Weight: 72 kg    Examination:  General exam: Appears calm and comfortable  Respiratory system: Clear to auscultation. Respiratory effort normal. Cardiovascular system: S1 & S2 heard, RRR. No JVD, murmurs, rubs, gallops or clicks. No pedal edema. Gastrointestinal system: Abdomen is nondistended, soft and nontender. No organomegaly or masses felt. Normal bowel sounds heard. Central nervous system: Alert and oriented. No focal neurological deficits. Extremities: Symmetric 5 x 5 power. Skin: No rashes, lesions or ulcers Psychiatry: Judgement and insight appear normal. Mood & affect appropriate.     Data Reviewed: I have personally reviewed following labs and imaging studies  CBC: Recent Labs  Lab 12/12/21 1506 12/13/21 0332  WBC 10.2 9.5  NEUTROABS 8.3*  --   HGB 12.2 10.4*  HCT 36.6 32.1*  MCV 94.1 93.6  PLT 112* 98*   Basic Metabolic Panel: Recent Labs  Lab 12/12/21 1506 12/13/21 0332  NA 144 139  K 3.8 4.1  CL 107 107  CO2 25 26  GLUCOSE 102* 108*  BUN 37* 35*  CREATININE 1.16* 1.12*  CALCIUM 10.0 9.3   GFR: Estimated Creatinine Clearance: 44.1 mL/min (A) (by C-G formula based on SCr of 1.12 mg/dL (H)). Liver Function Tests: Recent Labs  Lab 12/12/21 1506  AST 82*  ALT 33  ALKPHOS 69  BILITOT 1.7*   PROT 6.6  ALBUMIN 4.1   No results for input(s): LIPASE, AMYLASE in the last 168 hours. No results for input(s): AMMONIA in the last 168 hours. Coagulation Profile: No results for input(s): INR, PROTIME in the last 168 hours. Cardiac Enzymes: Recent Labs  Lab 12/12/21 1506 12/13/21 0332  CKTOTAL 2,439* 1,582*   BNP (last 3 results) No results for input(s): PROBNP in the last 8760 hours. HbA1C: No results for input(s): HGBA1C in the last 72 hours. CBG: No results for input(s): GLUCAP in the last 168 hours. Lipid Profile: No results for input(s): CHOL, HDL, LDLCALC, TRIG, CHOLHDL, LDLDIRECT in the last 72 hours. Thyroid Function Tests: No results for input(s): TSH, T4TOTAL, FREET4, T3FREE, THYROIDAB in the last 72 hours. Anemia Panel: No results for input(s): VITAMINB12, FOLATE, FERRITIN, TIBC, IRON, RETICCTPCT in the last 72 hours. Sepsis Labs: No results for input(s): PROCALCITON, LATICACIDVEN in the last 168 hours.  Recent Results (from the past 240 hour(s))  Resp Panel by RT-PCR (Flu A&B, Covid) Nasopharyngeal Swab     Status: None   Collection Time: 12/12/21  3:06 PM   Specimen: Nasopharyngeal Swab; Nasopharyngeal(NP) swabs in vial transport medium  Result Value Ref Range Status   SARS Coronavirus 2 by RT PCR NEGATIVE NEGATIVE Final    Comment: (NOTE) SARS-CoV-2 target nucleic acids are NOT DETECTED.  The SARS-CoV-2 RNA is generally detectable in upper respiratory specimens during the acute phase of infection. The lowest concentration of SARS-CoV-2 viral copies this assay can detect is 138 copies/mL. A negative result does not preclude SARS-Cov-2 infection and should not be used as the sole basis for treatment or other patient management decisions. A negative result may occur with  improper specimen collection/handling, submission of specimen other than nasopharyngeal swab, presence of viral mutation(s) within the areas targeted by this assay, and inadequate number  of viral copies(<138 copies/mL). A negative result must be combined with clinical observations, patient history, and epidemiological information. The expected result is Negative.  Fact Sheet for Patients:  EntrepreneurPulse.com.au  Fact Sheet for Healthcare Providers:  IncredibleEmployment.be  This test is no t yet approved or cleared by the [place] FDA and  has been authorized for detection and/or diagnosis of SARS-CoV-2 by FDA under an Emergency Use Authorization (EUA). This EUA will remain  in effect (meaning this test can be used) for the duration of the COVID-19 declaration under Section 564(b)(1) of the Act, 21 U.S.C.section 360bbb-3(b)(1), unless the authorization is terminated  or revoked sooner.       Influenza A by PCR NEGATIVE NEGATIVE Final   Influenza B by PCR NEGATIVE NEGATIVE Final    Comment: (NOTE) The Xpert Xpress SARS-CoV-2/FLU/RSV plus assay is intended as an aid in the diagnosis of influenza from Nasopharyngeal swab specimens and should not be used as a sole basis for treatment. Nasal washings and aspirates are unacceptable for Xpert Xpress SARS-CoV-2/FLU/RSV testing.  Fact Sheet for Patients: EntrepreneurPulse.com.au  Fact Sheet for Healthcare Providers: IncredibleEmployment.be  This  test is not yet approved or cleared by the [place] and has been authorized for detection and/or diagnosis of SARS-CoV-2 by FDA under an Emergency Use Authorization (EUA). This EUA will remain in effect (meaning this test can be used) for the duration of the COVID-19 declaration under Section 564(b)(1) of the Act, 21 U.S.C. section 360bbb-3(b)(1), unless the authorization is terminated or revoked.  Performed at [facility], [address] [place], [place] [address]          Radiology Studies: DG Chest 1 View  Result Date: 12/12/2021 CLINICAL DATA:  Right hip pain  EXAM: CHEST  1 VIEW COMPARISON:  Chest CT dated June 20, 2020 FINDINGS: Cardiac and mediastinal contours within normal limits. Elevation of the left hemidiaphragm. Lungs are clear. No large pleural effusion or evidence pneumothorax. IMPRESSION: No active disease. Electronically Signed   By: Yetta Glassman M.D.   On: 12/12/2021 14:57   CT Head Wo Contrast  Result Date: 12/12/2021 CLINICAL DATA:  Unwitnessed fall. EXAM: CT HEAD WITHOUT CONTRAST CT CERVICAL SPINE WITHOUT CONTRAST TECHNIQUE: Multidetector CT imaging of the head and cervical spine was performed following the standard protocol without intravenous contrast. Multiplanar CT image reconstructions of the cervical spine were also generated. COMPARISON:  None. FINDINGS: CT HEAD FINDINGS Brain: Mild chronic ischemic white matter disease is noted. No mass effect or midline shift is noted. Ventricular size is within normal limits. There is no evidence of mass lesion, hemorrhage or acute infarction. Vascular: No hyperdense vessel or unexpected calcification. Skull: Normal. Negative for fracture or focal lesion. Sinuses/Orbits: No acute finding. Other: None. CT CERVICAL SPINE FINDINGS Alignment: Mild grade 1 anterolisthesis of C3-4 and C4-5 is noted secondary to posterior facet joint hypertrophy. Skull base and vertebrae: No acute fracture. No primary bone lesion or focal pathologic process. Soft tissues and spinal canal: No prevertebral fluid or swelling. No visible canal hematoma. Disc levels: Severe degenerative disc disease is noted at C5-6 and C6-7 with anterior posterior osteophyte formation. Upper chest: Negative. Other: Degenerative changes are seen involving posterior facet joints bilaterally. IMPRESSION: No acute intracranial abnormality seen. Severe multilevel degenerative disc disease. No acute abnormality seen in the cervical spine. Electronically Signed   By: Marijo Conception M.D.   On: 12/12/2021 15:23   CT Angio Chest Pulmonary Embolism (PE) W  or WO Contrast  Result Date: 12/13/2021 CLINICAL DATA:  Syncopal episode with hypoxia. EXAM: CT ANGIOGRAPHY CHEST WITH CONTRAST TECHNIQUE: Multidetector CT imaging of the chest was performed using the standard protocol during bolus administration of intravenous contrast. Multiplanar CT image reconstructions and MIPs were obtained to evaluate the vascular anatomy. CONTRAST:  2mL OMNIPAQUE IOHEXOL 350 MG/ML SOLN COMPARISON:  Portable chest today, chest CT without contrast 06/20/2020. FINDINGS: Factors affecting image quality: Abundant respiratory motion. Cardiovascular: Pulmonary arteries are normal in caliber. There is a suspected segmental arterial filling defect suspicious for thrombus the medial segment of the right middle lobe on series 4 axial images 50 and 51. No other arterial filling defect is seen but the subsegmental arteries are obscured by abundant respiratory motion. This would be a very small embolic burden if truly present. The cardiac size is normal. Coronary arteries are heavily calcified. There is no pericardial effusion. There is lipomatous hypertrophy of the intra-atrial septum. There is aortic and great vessel atherosclerosis without dissection, moderate descending segment tortuosity, and slightly aneurysmal ascending segment measuring 4.2 cm. The remainder is within normal caliber limits. Mediastinum/Nodes: No enlarged mediastinal, hilar, or axillary lymph nodes.  Thyroid gland, trachea, and esophagus demonstrate no significant findings. Lungs/Pleura: There is mild central bronchial thickening in the lower lobes consistent with bronchitis. This was previously more pronounced. The lungs are mildly emphysematous with centrilobular changes predominating. No active lung infiltrate is seen. A few tiny noncalcified lung base nodules are again noted up to 5 mm. All are unchanged. There are areas of chronic scarring in the lower lung fields. There is no pleural effusion or pneumothorax. Mild chronic  elevation left hemidiaphragm. Upper Abdomen: Gallbladder is distended and contains stones measuring up to 2.7 cm. No biliary dilatation. Postsurgical changes of the stomach are chronically seen. Musculoskeletal: There is osteopenia and degenerative changes of the spine. No focal osseous lesion in the thorax. Review of the MIP images confirms the above findings. IMPRESSION: 1. Study is motion-limited. There is a questionable medial segmental right middle lobe arterial filling defect on 2 images. There is no evidence of right heart strain or further visible embolus, but the subsegmental arterial bed is obscured due to breathing motion. 2. 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 Aortic atherosclerosis. 3. Pulmonary emphysema, with several scattered subcentimeter lung base nodules, unchanged, findings of at least mild lower lobe bronchitis without evidence of pneumonia. 4. Cholelithiasis with distended gallbladder without visible wall thickening. The gallbladder incompletely visualized and not well seen due to breathing motion. 5. Osteopenia and degenerative change. 6. Calcific CAD.  Lipomatous hypertrophy interatrial septum. Electronically Signed   By: Telford Nab M.D.   On: 12/13/2021 03:36   CT Cervical Spine Wo Contrast  Result Date: 12/12/2021 CLINICAL DATA:  Unwitnessed fall. EXAM: CT HEAD WITHOUT CONTRAST CT CERVICAL SPINE WITHOUT CONTRAST TECHNIQUE: Multidetector CT imaging of the head and cervical spine was performed following the standard protocol without intravenous contrast. Multiplanar CT image reconstructions of the cervical spine were also generated. COMPARISON:  None. FINDINGS: CT HEAD FINDINGS Brain: Mild chronic ischemic white matter disease is noted. No mass effect or midline shift is noted. Ventricular size is within normal limits. There is no evidence of mass lesion, hemorrhage or acute infarction. Vascular: No hyperdense vessel or unexpected calcification. Skull: Normal. Negative for  fracture or focal lesion. Sinuses/Orbits: No acute finding. Other: None. CT CERVICAL SPINE FINDINGS Alignment: Mild grade 1 anterolisthesis of C3-4 and C4-5 is noted secondary to posterior facet joint hypertrophy. Skull base and vertebrae: No acute fracture. No primary bone lesion or focal pathologic process. Soft tissues and spinal canal: No prevertebral fluid or swelling. No visible canal hematoma. Disc levels: Severe degenerative disc disease is noted at C5-6 and C6-7 with anterior posterior osteophyte formation. Upper chest: Negative. Other: Degenerative changes are seen involving posterior facet joints bilaterally. IMPRESSION: No acute intracranial abnormality seen. Severe multilevel degenerative disc disease. No acute abnormality seen in the cervical spine. Electronically Signed   By: Marijo Conception M.D.   On: 12/12/2021 15:23   CT Hip Right Wo Contrast  Result Date: 12/12/2021 CLINICAL DATA:  [place].  Hip fracture. EXAM: CT OF THE RIGHT HIP WITHOUT CONTRAST TECHNIQUE: Multidetector CT imaging of the right hip was performed according to the standard protocol. Multiplanar CT image reconstructions were also generated. COMPARISON:  Radiographs, same date. FINDINGS: There is a complex comminuted intertrochanteric fracture of the right hip with moderate impaction. The femoral head neck are intact. No acetabular fracture. The visualized right hemipelvic bony structures are intact. Chondrocalcinosis noted at the pubic symphysis. Associated soft tissue swelling and intramuscular hemorrhage involving the right hip and thigh muscles. Benign-appearing subcutaneous lipoma noted overlying  the right hip musculature. IMPRESSION: 1. Complex comminuted intertrochanteric fracture of the right hip with moderate impaction. 2. Associated soft tissue swelling and intramuscular hemorrhage involving the right hip and thigh muscles. Electronically Signed   By: Marijo Sanes M.D.   On: 12/12/2021 22:02   DG Hip Unilat With Pelvis  2-3 Views Right  Result Date: 12/12/2021 CLINICAL DATA:  RIGHT leg pain, deformity and dementia. EXAM: DG HIP (WITH OR WITHOUT PELVIS) 2-3V RIGHT COMPARISON:  None FINDINGS: Over riding, fracture with both Verus and apex anterior angulation involving the intratrochanteric RIGHT femur and associated with mild comminution. Femoral head is located. LEFT hip unremarkable on AP projection. No sign of fracture of the bony pelvis. Degenerative changes in the lower lumbar spine. IMPRESSION: Angulated, comminuted, and overriding intertrochanteric fracture of the RIGHT femur. Electronically Signed   By: Zetta Bills M.D.   On: 12/12/2021 14:58    Scheduled Meds:  carvedilol  6.25 mg Oral BID WC   cholecalciferol  2,000 Units Oral Daily   hydrALAZINE  50 mg Oral Daily   ipratropium  2 spray Nasal TID   losartan  100 mg Oral Daily   rosuvastatin  40 mg Oral Daily   Continuous Infusions:  sodium chloride 100 mL/hr at 12/13/21 0041    ceFAZolin (ANCEF) IV       LOS: 1 day   Time spent: 3min  Angeline Trick C Mathews Stuhr, DO [facility]  If 7PM-7AM, please contact night-coverage [email or web address]  12/13/2021, 8:15 AM

## 2021-12-13 NOTE — Anesthesia Preprocedure Evaluation (Signed)
Anesthesia Evaluation  Patient identified by MRN, date of birth, ID band Patient confused  General Assessment Comment:Patient delirious today, requiring sedatives. Per son, patient's baseline is mostly AOx3. Lived by herself prior to her fall.  Reviewed: Allergy & Precautions, NPO status , Patient's Chart, lab work & pertinent test results  History of Anesthesia Complications Negative for: history of anesthetic complications  Airway Mallampati: Unable to assess  TM Distance: >3 FB     Dental  (+) Poor Dentition   Pulmonary neg pulmonary ROS, neg sleep apnea, COPD,  COPD inhaler, Current SmokerPatient did not abstain from smoking.,    Pulmonary exam normal breath sounds clear to auscultation       Cardiovascular Exercise Tolerance: Good METShypertension, + CAD  (-) Past MI (-) dysrhythmias  Rhythm:Regular Rate:Normal - Systolic murmurs    Neuro/Psych PSYCHIATRIC DISORDERS Dementia negative neurological ROS  negative psych ROS   GI/Hepatic neg GERD  ,(+)     (-) substance abuse  ,   Endo/Other  neg diabetes  Renal/GU CRFRenal disease     Musculoskeletal   Abdominal   Peds  Hematology   Anesthesia Other Findings Past Medical History: No date: Cancer ([place])     Comment:  cervical  Reproductive/Obstetrics                             Anesthesia Physical Anesthesia Plan  ASA: 2  Anesthesia Plan: Spinal   Post-op Pain Management: Ofirmev IV (intra-op)   Induction: Intravenous  PONV Risk Score and Plan: 2 and Ondansetron, Dexamethasone, Propofol infusion, TIVA and Treatment may vary due to age or medical condition  Airway Management Planned: Natural Airway  Additional Equipment: None  Intra-op Plan:   Post-operative Plan:   Informed Consent: I have reviewed the patients History and Physical, chart, labs and discussed the procedure including the risks, benefits and alternatives for  the proposed anesthesia with the patient or authorized representative who has indicated his/her understanding and acceptance.     Consent reviewed with POA  Plan Discussed with: CRNA and Surgeon  Anesthesia Plan Comments: (Discussed R/B/A of neuraxial anesthesia technique with patient's son / POA due to patient's delirium: - rare risks of spinal/epidural hematoma, nerve damage, infection - Risk of PDPH - post operative cognitive dysfunction - Risk of nausea and vomiting - Risk of conversion to general anesthesia and its associated risks, including sore throat, damage to lips/eyes/teeth/oropharynx, and rare risks such as cardiac and respiratory events. - Risk of allergic reactions  Discussed the role of CRNA in patient's perioperative care.  He voiced understanding.)        Anesthesia Quick Evaluation

## 2021-12-13 NOTE — OR Nursing (Signed)
Silver ring removed from patient's left thumb and placed in patient labeled biohazard bag and placed with patient's chart papers.  Ladell Heads, RN

## 2021-12-13 NOTE — Plan of Care (Signed)
  Problem: Education: Goal: Knowledge of General Education information will improve Description: Including pain rating scale, medication(s)/side effects and non-pharmacologic comfort measures Outcome: Not Progressing   Problem: Clinical Measurements: Goal: Ability to maintain clinical measurements within normal limits will improve Outcome: Not Progressing   

## 2021-12-13 NOTE — Transfer of Care (Signed)
Immediate Anesthesia Transfer of Care Note  Patient: Morgan Stewart  Procedure(s) Performed: INTRAMEDULLARY (IM) NAIL INTERTROCHANTRIC (Right: Hip)  Patient Location: PACU  Anesthesia Type:General  Level of Consciousness: sedated  Airway & Oxygen Therapy: Patient Spontanous Breathing and Patient connected to face mask oxygen  Post-op Assessment: Report given to RN and Post -op Vital signs reviewed and stable  Post vital signs: Reviewed  Last Vitals:  Vitals Value Taken Time  BP    Temp    Pulse    Resp    SpO2      Last Pain:  Vitals:   12/13/21 1126  TempSrc: Temporal  PainSc:          Complications: No notable events documented.

## 2021-12-13 NOTE — ED Notes (Signed)
Pt 02 sat=88-91%, pt placed on 2LNC.

## 2021-12-14 ENCOUNTER — Encounter: Payer: Self-pay | Admitting: Orthopedic Surgery

## 2021-12-14 DIAGNOSIS — S72001A Fracture of unspecified part of neck of right femur, initial encounter for closed fracture: Secondary | ICD-10-CM | POA: Diagnosis not present

## 2021-12-14 LAB — CBC
HCT: 26.5 % — ABNORMAL LOW (ref 36.0–46.0)
Hemoglobin: 8.7 g/dL — ABNORMAL LOW (ref 12.0–15.0)
MCH: 31.5 pg (ref 26.0–34.0)
MCHC: 32.8 g/dL (ref 30.0–36.0)
MCV: 96 fL (ref 80.0–100.0)
Platelets: 81 10*3/uL — ABNORMAL LOW (ref 150–400)
RBC: 2.76 MIL/uL — ABNORMAL LOW (ref 3.87–5.11)
RDW: 13.4 % (ref 11.5–15.5)
WBC: 6.5 10*3/uL (ref 4.0–10.5)
nRBC: 0 % (ref 0.0–0.2)

## 2021-12-14 LAB — BASIC METABOLIC PANEL
Anion gap: 8 (ref 5–15)
BUN: 34 mg/dL — ABNORMAL HIGH (ref 8–23)
CO2: 24 mmol/L (ref 22–32)
Calcium: 8.8 mg/dL — ABNORMAL LOW (ref 8.9–10.3)
Chloride: 110 mmol/L (ref 98–111)
Creatinine, Ser: 0.96 mg/dL (ref 0.44–1.00)
GFR, Estimated: >60 mL/min (ref >60)
Glucose, Bld: 103 mg/dL — ABNORMAL HIGH (ref 70–99)
Potassium: 4 mmol/L (ref 3.5–5.1)
Sodium: 142 mmol/L (ref 135–145)

## 2021-12-14 MED ORDER — SODIUM CHLORIDE 0.9 % IV BOLUS
250.0000 mL | Freq: Once | INTRAVENOUS | Status: AC
  Administered 2021-12-14: 15:00:00 250 mL via INTRAVENOUS

## 2021-12-14 MED ORDER — LORAZEPAM 0.5 MG PO TABS
0.5000 mg | ORAL_TABLET | Freq: Two times a day (BID) | ORAL | Status: DC | PRN
  Administered 2021-12-14 – 2021-12-22 (×4): 0.5 mg via ORAL
  Filled 2021-12-14 (×4): qty 1

## 2021-12-14 NOTE — Evaluation (Signed)
Physical Therapy Evaluation Patient Details Name: Morgan Stewart MRN: [id number] DOB: 08-21-1946 Today's Date: 12/14/2021  History of Present Illness  Pt is a 75 y/o F admitted on 12/12/21 with c/c of an unwitnessed fall. Pt was found to have displaced R intertrochanteric hip fracture & underwent intramedullary fixation on 12/13/21 by Dr. Mack Guise. Pt also had subsequent acute rhabdomyolysis s/p fall. PMH: CAD, HTN, COPD, DJD, gout, dyslipidemia, vitamin D & B12 deficiencies, tobacco abuse  Clinical Impression  Pt seen for PT evaluation with pt received in bed on 4L/min via nasal cannula. Pt oriented to self & current state but otherwise does not respond to orientation questions & doesn't really follow simple commands throughout session. Pt requires max<>total assist for supine<>sit and does not tolerate sitting upright in midline as she continues to lean L & resist putting weight through R hip & doesn't respond to cuing/commands; standing deferred/not safe to attempt at this time. Pt assisted back to bed & left in care of nurse. Pt would benefit from STR upon d/c to maximize independence with functional mobility, decrease caregiver burden, & reduce fall risk prior to return home.     Recommendations for follow up therapy are one component of a multi-disciplinary discharge planning process, led by the attending physician.  Recommendations may be updated based on patient status, additional functional criteria and insurance authorization.  Follow Up Recommendations Skilled nursing-short term rehab (<3 hours/day)    Assistance Recommended at Discharge Frequent or constant Supervision/Assistance  Functional Status Assessment Patient has had a recent decline in their functional status and demonstrates the ability to make significant improvements in function in a reasonable and predictable amount of time.  Equipment Recommendations   (TBD in next venue)    Recommendations for Other Services        Precautions / Restrictions Precautions Precautions: Fall Restrictions Weight Bearing Restrictions: Yes RLE Weight Bearing: Weight bearing as tolerated      Mobility  Bed Mobility Overal bed mobility: Needs Assistance Bed Mobility: Rolling;Supine to Sit;Sit to Supine Rolling: Max assist   Supine to sit: Max assist;HOB elevated Sit to supine: Total assist   General bed mobility comments: Difficulty following commands and needing more assistance    Transfers Overall transfer level: Needs assistance Equipment used: Rolling walker (2 wheels) Transfers: Sit to/from Stand Sit to Stand: Max assist                Ambulation/Gait                  Stairs            Wheelchair Mobility    Modified Rankin (Stroke Patients Only)       Balance Overall balance assessment: Needs assistance Sitting-balance support: Bilateral upper extremity supported;Feet supported Sitting balance-Leahy Scale: Zero Sitting balance - Comments: L lateral lean unable to come to midline despite PT attempting to facilitate & cue pt   Standing balance support: During functional activity;Bilateral upper extremity supported Standing balance-Leahy Scale: Zero                               Pertinent Vitals/Pain Pain Assessment: Faces Faces Pain Scale: Hurts whole lot Pain Location: R hip Pain Descriptors / Indicators: Grimacing;Discomfort Pain Intervention(s): Limited activity within patient's tolerance;Repositioned;Monitored during session    Home Living Family/patient expects to be discharged to:: Unsure Living Arrangements: Alone  Prior Function Prior Level of Function : Independent/Modified Independent             Mobility Comments: Son reports pt living at home with family to check in and get medications and food delivered. Pt likely not taking medication. Home appears to be hoarding situation. Pt would be unable to use AD if  needed. ADLs Comments: Family reports living alone and caring for herself. Clothing items in shower and food and trash all over home. Pt likely with poor hygiene at home secondary to cognition.     Hand Dominance   Dominant Hand: Right    Extremity/Trunk Assessment   Upper Extremity Assessment Upper Extremity Assessment: Generalized weakness    Lower Extremity Assessment Lower Extremity Assessment: Difficult to assess due to impaired cognition (RLE not formally tested 2/2 pain)    Cervical / Trunk Assessment Cervical / Trunk Assessment: Kyphotic  Communication      Cognition Arousal/Alertness: Lethargic Behavior During Therapy: Restless Overall Cognitive Status: History of cognitive impairments - at baseline                                 General Comments: Pt oriented to self & state but otherwise doesn't respond to orientation questions. Does not really follow simple commands with extra time & multimodal cuing.        General Comments General comments (skin integrity, edema, etc.): Pt on 4L/min via nasal cannula, unable to get SpO2 reading on pulse ox 2/2 nail polish, pt breathing through mouth the entire time despite cuing    Exercises General Exercises - Lower Extremity Heel Slides: PROM;Right;10 reps;Supine   Assessment/Plan    PT Assessment Patient needs continued PT services  PT Problem List Decreased strength;Decreased mobility;Decreased safety awareness;Decreased coordination;Decreased range of motion;Decreased knowledge of precautions;Decreased activity tolerance;Decreased cognition;Cardiopulmonary status limiting activity;Pain;Decreased knowledge of use of DME;Decreased balance       PT Treatment Interventions DME instruction;Therapeutic exercise;Wheelchair mobility training;Gait training;Balance training;Stair training;Neuromuscular re-education;Functional mobility training;Cognitive remediation;Patient/family education;Therapeutic  activities;Modalities;Manual techniques    PT Goals (Current goals can be found in the Care Plan section)  Acute Rehab PT Goals PT Goal Formulation: Patient unable to participate in goal setting Time For Goal Achievement: 12/28/21 Potential to Achieve Goals: Fair    Frequency 7X/week   Barriers to discharge Decreased caregiver support;Inaccessible home environment      Co-evaluation               AM-PAC PT "6 Clicks" Mobility  Outcome Measure Help needed turning from your back to your side while in a flat bed without using bedrails?: Total Help needed moving from lying on your back to sitting on the side of a flat bed without using bedrails?: Total Help needed moving to and from a bed to a chair (including a wheelchair)?: Total Help needed standing up from a chair using your arms (e.g., wheelchair or bedside chair)?: Total Help needed to walk in hospital room?: Total Help needed climbing 3-5 steps with a railing? : Total 6 Click Score: 6    End of Session Equipment Utilized During Treatment: Oxygen Activity Tolerance: Patient limited by lethargy Patient left: in bed;with call bell/phone within reach;with bed alarm set;with nursing/sitter in room Nurse Communication: Mobility status PT Visit Diagnosis: Unsteadiness on feet (R26.81);Muscle weakness (generalized) (M62.81);Difficulty in walking, not elsewhere classified (R26.2);Pain Pain - Right/Left: Right Pain - part of body: Hip    Time: 5643-3295 PT Time Calculation (min) (  ACUTE ONLY): 14 min   Charges:   PT Evaluation $PT Eval Moderate Complexity: [place], PT, DPT 12/14/21, 12:19 PM   Waunita Schooner 12/14/2021, 12:11 PM

## 2021-12-14 NOTE — Evaluation (Signed)
Occupational Therapy Evaluation Patient Details Name: Morgan Stewart MRN: [id number] DOB: Nov 02, 1946 Today's Date: 12/14/2021   History of Present Illness Pt is 75 y/o s/p mechanical fall resulting in R hip IM nailing. PMH significant for dementia.   Clinical Impression   Patient presenting with decreased Ind in self care, balance, functional mobility/ transfers, endurance, strength, and safety awareness. Patient with dementia at baseline and son present in room to discuss pt's current situation. He provided photo's of pt's home which appear to have unsafe clutter and trash all over home. He reports 2 family members checking in on pt and bringing her food and medication. Pt's is cognitively impaired during this session with very tangential speech. She follows commands with max multimodal cuing and manual facilitation.Patient currently needing max A for bed mobility and standing from standard height bed. Pt unable to take steps or shift weight. Patient will benefit from acute OT to increase overall independence in the areas of ADLs, functional mobility, and safety awareness in order to safely discharge to next venue of care.      Recommendations for follow up therapy are one component of a multi-disciplinary discharge planning process, led by the attending physician.  Recommendations may be updated based on patient status, additional functional criteria and insurance authorization.   Follow Up Recommendations  Skilled nursing-short term rehab (<3 hours/day)    Assistance Recommended at Discharge Frequent or constant Supervision/Assistance  Functional Status Assessment  Patient has had a recent decline in their functional status and demonstrates the ability to make significant improvements in function in a reasonable and predictable amount of time.  Equipment Recommendations  Other (comment) (defer to next venue of care)       Precautions / Restrictions Precautions Precautions:  Fall Restrictions Weight Bearing Restrictions: Yes RLE Weight Bearing: Weight bearing as tolerated      Mobility Bed Mobility Overal bed mobility: Needs Assistance Bed Mobility: Supine to Sit;Sit to Supine     Supine to sit: Max assist Sit to supine: Total assist   General bed mobility comments: Difficulty following commands and needing more assistance    Transfers Overall transfer level: Needs assistance Equipment used: Rolling walker (2 wheels) Transfers: Sit to/from Stand Sit to Stand: Max assist                  Balance Overall balance assessment: Needs assistance Sitting-balance support: Bilateral upper extremity supported;No upper extremity supported Sitting balance-Leahy Scale: Poor Sitting balance - Comments: mod A progressing to min guard   Standing balance support: During functional activity;Bilateral upper extremity supported Standing balance-Leahy Scale: Zero                             ADL either performed or assessed with clinical judgement   ADL Overall ADL's : Needs assistance/impaired     Grooming: Wash/dry hands;Wash/dry face;Supervision/safety;Set up;Sitting               Lower Body Dressing: Maximal assistance                       Vision Patient Visual Report: No change from baseline              Pertinent Vitals/Pain Pain Assessment: Faces Faces Pain Scale: Hurts little more Pain Location: R hip Pain Descriptors / Indicators: Aching;Discomfort Pain Intervention(s): Limited activity within patient's tolerance;Premedicated before session;Repositioned;Monitored during session     Hand Dominance Right   Extremity/Trunk  Assessment Upper Extremity Assessment Upper Extremity Assessment: Generalized weakness   Lower Extremity Assessment Lower Extremity Assessment: Defer to PT evaluation       Communication     Cognition Arousal/Alertness: Awake/alert Behavior During Therapy:  Restless;Impulsive Overall Cognitive Status: History of cognitive impairments - at baseline                                 General Comments: Pt oriented to self only. Very tangential speech and increased time and cuing to process, initiate, and sequence tasks.                Home Living Family/patient expects to be discharged to:: Assisted living Living Arrangements: Alone                                      Prior Functioning/Environment Prior Level of Function : Independent/Modified Independent             Mobility Comments: Son reports pt living at home with family to check in and get medications and food delivered by pt living alone. Pt likely not taking medication. Home appears to be hoarding situation. Pt would be unable to use AD if needed. ADLs Comments: Family reports living alone and caring for herself. Clothing items in shower and food and trash all over home. Pt likely with poor hygiene at home secondary to cognition.        OT Problem List: Decreased strength;Decreased activity tolerance;Impaired balance (sitting and/or standing);Decreased safety awareness;Pain;Decreased cognition;Cardiopulmonary status limiting activity;Decreased knowledge of use of DME or AE      OT Treatment/Interventions: Self-care/ADL training;Manual therapy;Therapeutic exercise;Modalities;Patient/family education;Balance training;Energy conservation;Therapeutic activities;DME and/or AE instruction;Cognitive remediation/compensation    OT Goals(Current goals can be found in the care plan section) Acute Rehab OT Goals Patient Stated Goal: to go to rehab OT Goal Formulation: With patient/family Time For Goal Achievement: 12/28/21 Potential to Achieve Goals: Fair  OT Frequency: Min 2X/week   Barriers to D/C: Decreased caregiver support             AM-PAC OT "6 Clicks" Daily Activity     Outcome Measure Help from another person eating meals?: None Help  from another person taking care of personal grooming?: A Little Help from another person toileting, which includes using toliet, bedpan, or urinal?: Total Help from another person bathing (including washing, rinsing, drying)?: A Lot Help from another person to put on and taking off regular upper body clothing?: A Little Help from another person to put on and taking off regular lower body clothing?: Total 6 Click Score: 14   End of Session Equipment Utilized During Treatment: Rolling walker (2 wheels);Oxygen (4Ls) Nurse Communication: Mobility status  Activity Tolerance: Patient limited by pain Patient left: in bed;with call bell/phone within reach;with bed alarm set;with family/visitor present  OT Visit Diagnosis: Unsteadiness on feet (R26.81);Repeated falls (R29.6);Muscle weakness (generalized) (M62.81);History of falling (Z91.81)                Time: 1000-1024 OT Time Calculation (min): 24 min Charges:  OT General Charges $OT Visit: 1 Visit OT Evaluation $OT Eval Moderate Complexity: 1 Mod OT Treatments $Self Care/Home Management : 8-22 mins  Darleen Crocker, MS, OTR/L , CBIS ascom [phone]  12/14/21, 2:34 PM

## 2021-12-14 NOTE — TOC Initial Note (Addendum)
Transition of Care [facility]) - Initial/Assessment Note    Patient Details  Name: Morgan Stewart MRN: [id number] Date of Birth: 04/10/1946  [facility]) CM/SW Contact:    Magnus Ivan, LCSW Phone Number: 12/14/2021, 1:19 PM  Clinical Narrative:                 Per chart review, patient is not fully oriented. CSW spoke with niece/POA Morgan Stewart.  Morgan Stewart reported prior to admission patient was living alone with family checking on her daily and providing transportation. PCP is [facility] in [place]. Pharmacyis [facility] in [place]. Patient has a RW and cane at home. No SNF history. Patient has had 2 COVID vaccines and 2 boosters.  Explained PT rec for SNF for short term rehab. Morgan Stewart is agreeable to this. She stated she is also planning to seek long term care for patient after rehab. Explained long term care process and encouraged Morgan Stewart to reach out to DSS to start Medicaid application and also reach out to facilities to get the process started. Morgan Stewart stated she is interested in [place]. CSW is starting SNF work up.  3:22- Uploaded PASRR additional info. PASRR pending. Updated TOC handoff.  Expected Discharge Plan: Skilled Nursing Facility Barriers to Discharge: Continued Medical Work up   Patient Goals and CMS Choice Patient states their goals for this hospitalization and ongoing recovery are:: SNF rehab then transition to long term care CMS Medicare.gov Compare Post Acute Care list provided to:: Patient Represenative (must comment) Choice offered to / list presented to : [place] / [facility]  Expected Discharge Plan and Services Expected Discharge Plan: [place]       Living arrangements for the past 2 months: Single Family Home                                      Prior Living Arrangements/Services Living arrangements for the past 2 months: Single Family Home Lives with:: Self Patient language and need for interpreter  reviewed:: Yes Do you feel safe going back to the place where you live?: No      Need for Family Participation in Patient Care: Yes (Comment) Care giver support system in place?: Yes (comment) Current home services: DME Criminal Activity/Legal Involvement Pertinent to Current Situation/Hospitalization: No - Comment as needed  Activities of Daily Living Home Assistive Devices/Equipment: Walker (specify type) ADL Screening (condition at time of admission) Patient's cognitive ability adequate to safely complete daily activities?: No Is the patient deaf or have difficulty hearing?: No Does the patient have difficulty seeing, even when wearing glasses/contacts?: No Does the patient have difficulty concentrating, remembering, or making decisions?: Yes Patient able to express need for assistance with ADLs?: Yes Does the patient have difficulty dressing or bathing?: Yes Independently performs ADLs?: No Communication: Needs assistance Is this a change from baseline?: Change from baseline, expected to last <3 days Dressing (OT): Needs assistance Is this a change from baseline?: Change from baseline, expected to last <3days Grooming: Independent Feeding: Needs assistance Is this a change from baseline?: Change from baseline, expected to last <3 days Bathing: Needs assistance Is this a change from baseline?: Change from baseline, expected to last <3 days Toileting: Needs assistance Is this a change from baseline?: Change from baseline, expected to last <3 days In/Out Bed: Needs assistance Is this a change from baseline?: Change from baseline, expected to last >3 days  Walks in Home: Needs assistance Is this a change from baseline?: Change from baseline, expected to last >3 days Does the patient have difficulty walking or climbing stairs?: Yes Weakness of Legs: Both Weakness of Arms/Hands: Both  Permission Sought/Granted Permission sought to share information with : Research scientist (medical) granted to share information with : Yes, Hospital doctor (by POA)     Permission granted to share info w AGENCY: SNF, ALFs        Emotional Assessment       Orientation: : Fluctuating Orientation (Suspected and/or reported Sundowners) Alcohol / Substance Use: Not Applicable Psych Involvement: No (comment)  Admission diagnosis:  Surgery, elective [Z41.9] Closed right hip fracture ([place]) [S72.001A] Closed displaced intertrochanteric fracture of right femur, initial encounter ([place]) [S72.141A] Traumatic rhabdomyolysis, initial encounter ([place]) [T79.6XXA] Patient Active Problem List   Diagnosis Date Noted   Closed right hip fracture ([place]) 12/12/2021   Smoking history 07/17/2020   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   Colon cancer screening 02/10/2020   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   Thrombocytopenia ([place]) 11/26/2019   COPD (chronic obstructive pulmonary disease) ([place]) 11/26/2019   Gout 11/26/2019   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   LVH (left ventricular hypertrophy) 05/06/2019   Palpitations 05/03/2019   Chronic kidney disease (CKD) stage G3a/A1, moderately decreased glomerular filtration rate (GFR) between 45-59 mL/min/1.73 square meter and albuminuria creatinine ratio less than 30 mg/g (HCC) 04/07/2019   Vitamin D deficiency 04/07/2019   Low vitamin B12 level 02/03/2019   Left hip pain 08/11/2018   Radicular pain of lumbosacral region 08/11/2018   Low vitamin D level 08/03/2018   Opacity of lung on imaging study 10/18/2015   Microscopic hematuria 03/08/2014   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 09/10/2012   Hallux valgus with bunions 09/10/2012   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   Hyperlipidemia 10/14/2011   Coronary artery disease involving native coronary artery of native heart 07/09/2011   Essential (primary) hypertension 07/09/2011   PCP:  Lequita Asal, MD (Inactive) Pharmacy:   [facility] [phone] [address]  [place] [address] Phone: [phone] Fax: [phone]     Social Determinants of Health (SDOH) Interventions    Readmission Risk Interventions Readmission Risk Prevention Plan 12/14/2021  Post Dischage Appt Complete  Medication Screening Complete  Transportation Screening Complete  Some recent data might be hidden

## 2021-12-14 NOTE — Progress Notes (Signed)
PROGRESS NOTE    Morgan Stewart  BZJ:[id number] DOB: 1946/08/20 DOA: 12/12/2021 PCP: Lequita Asal, MD (Inactive)   Brief Narrative:  Morgan Stewart is a 75 y.o. Caucasian female with medical history significant for Coronary artery disease, hypertension, COPD, DJD, gout, dyslipidemia, vitamin D deficiency, vitamin B12 deficiency and tobacco abuse, presented to the ER with a [place] of right hip pain after having a fall last night.  It is unclear if she had a syncope or not.  She stated that she could not get up after falling -unwitnessed event as she lives independently at home.  In the ED imaging remarkable for right femur fracture, orthopedics consulted, hospitalist called for admission.  Assessment & Plan:  Closed right hip fracture secondary to presumed mechanical fall, POA Acute rhabdomyolysis, resolving Transient hypoxia secondary to narcotics, resolving -Orthopedics following, status post ORIF 12/29 -Pain currently well controlled, patient somnolent, transient event of hypoxia improving with weaning of narcotics -advance diet -PT/OT ongoing - plan for dispo to SNF given fracture type and age.  Essential hypertension, uncontrolled in the setting of pain. -Continue carvedilol hydralazine and Cozaar -Blood pressure much more reasonable with pain control  Dyslipidemia -continue statin Dementia -transient metabolic encephalopathy(POA) likely secondary to narcotics and pain appears to be resolving, back to baseline today per son at bedside Depression continue Effexor Exar, trazodone  Coronary artery disease. W continue carvedilol, blood pressure control ; holding aspirin perioperatively.  DVT prophylaxis: SCDs. Code Status: full code. Family Communication:  The plan of care was discussed in details with the patient   Status is: Inpatient  Dispo: The patient is from: Home              Anticipated d/c is to: To be determined              Anticipated d/c date is: 48  to 72 hours              Patient currently not medically stable for discharge  Consultants:  Orthopedic surgery  Procedures:  ORIF right femur 12/13/2021  Antimicrobials:  None indicated  Subjective: No acute issues or events overnight, much more awake alert and oriented this morning, still confused which is her baseline per son at bedside but agreeable for PT and interacting/following commands appropriately.  Objective: Vitals:   12/13/21 1747 12/13/21 1953 12/13/21 2333 12/14/21 0422  BP: 93/70 102/72 105/74 (!) 143/82  Pulse:  86 86 91  Resp: 17 16 16 20   Temp: (!) 97 F (36.1 C) 97.7 F (36.5 C) 97.6 F (36.4 C) 97.9 F (36.6 C)  TempSrc: Axillary     SpO2: 100%  96%   Weight:      Height:        Intake/Output Summary (Last 24 hours) at 12/14/2021 0716 Last data filed at 12/14/2021 0542 Gross per 24 hour  Intake 2117.02 ml  Output 590 ml  Net 1527.02 ml    Filed Weights   12/12/21 1420  Weight: 72 kg    Examination:  General exam: Appears calm and comfortable  Respiratory system: Clear to auscultation. Respiratory effort normal. Cardiovascular system: S1 & S2 heard, RRR. No JVD, murmurs, rubs, gallops or clicks. No pedal edema. Gastrointestinal system: Abdomen is nondistended, soft and nontender. No organomegaly or masses felt. Normal bowel sounds heard. Central nervous system: Alert and oriented. No focal neurological deficits. Extremities: Symmetric 5 x 5 power. Skin: No rashes, lesions or ulcers Psychiatry: Judgement and insight appear normal. Mood & affect appropriate.  Data Reviewed: I have personally reviewed following labs and imaging studies  CBC: Recent Labs  Lab 12/12/21 1506 12/13/21 0332  WBC 10.2 9.5  NEUTROABS 8.3*  --   HGB 12.2 10.4*  HCT 36.6 32.1*  MCV 94.1 93.6  PLT 112* 98*    Basic Metabolic Panel: Recent Labs  Lab 12/12/21 1506 12/13/21 0332  NA 144 139  K 3.8 4.1  CL 107 107  CO2 25 26  GLUCOSE 102* 108*   BUN 37* 35*  CREATININE 1.16* 1.12*  CALCIUM 10.0 9.3    GFR: Estimated Creatinine Clearance: 44.1 mL/min (A) (by C-G formula based on SCr of 1.12 mg/dL (H)). Liver Function Tests: Recent Labs  Lab 12/12/21 1506  AST 82*  ALT 33  ALKPHOS 69  BILITOT 1.7*  PROT 6.6  ALBUMIN 4.1    No results for input(s): LIPASE, AMYLASE in the last 168 hours. No results for input(s): AMMONIA in the last 168 hours. Coagulation Profile: No results for input(s): INR, PROTIME in the last 168 hours. Cardiac Enzymes: Recent Labs  Lab 12/12/21 1506 12/13/21 0332 12/13/21 1801  CKTOTAL 2,439* 1,582* 980*    BNP (last 3 results) No results for input(s): PROBNP in the last 8760 hours. HbA1C: No results for input(s): HGBA1C in the last 72 hours. CBG: No results for input(s): GLUCAP in the last 168 hours. Lipid Profile: No results for input(s): CHOL, HDL, LDLCALC, TRIG, CHOLHDL, LDLDIRECT in the last 72 hours. Thyroid Function Tests: No results for input(s): TSH, T4TOTAL, FREET4, T3FREE, THYROIDAB in the last 72 hours. Anemia Panel: No results for input(s): VITAMINB12, FOLATE, FERRITIN, TIBC, IRON, RETICCTPCT in the last 72 hours. Sepsis Labs: No results for input(s): PROCALCITON, LATICACIDVEN in the last 168 hours.  Recent Results (from the past 240 hour(s))  Resp Panel by RT-PCR (Flu A&B, Covid) Nasopharyngeal Swab     Status: None   Collection Time: 12/12/21  3:06 PM   Specimen: Nasopharyngeal Swab; Nasopharyngeal(NP) swabs in vial transport medium  Result Value Ref Range Status   SARS Coronavirus 2 by RT PCR NEGATIVE NEGATIVE Final    Comment: (NOTE) SARS-CoV-2 target nucleic acids are NOT DETECTED.  The SARS-CoV-2 RNA is generally detectable in upper respiratory specimens during the acute phase of infection. The lowest concentration of SARS-CoV-2 viral copies this assay can detect is 138 copies/mL. A negative result does not preclude SARS-Cov-2 infection and should not be used  as the sole basis for treatment or other patient management decisions. A negative result may occur with  improper specimen collection/handling, submission of specimen other than nasopharyngeal swab, presence of viral mutation(s) within the areas targeted by this assay, and inadequate number of viral copies(<138 copies/mL). A negative result must be combined with clinical observations, patient history, and epidemiological information. The expected result is Negative.  Fact Sheet for Patients:  EntrepreneurPulse.com.au  Fact Sheet for Healthcare Providers:  IncredibleEmployment.be  This test is no t yet approved or cleared by the [place] FDA and  has been authorized for detection and/or diagnosis of SARS-CoV-2 by FDA under an Emergency Use Authorization (EUA). This EUA will remain  in effect (meaning this test can be used) for the duration of the COVID-19 declaration under Section 564(b)(1) of the Act, 21 U.S.C.section 360bbb-3(b)(1), unless the authorization is terminated  or revoked sooner.       Influenza A by PCR NEGATIVE NEGATIVE Final   Influenza B by PCR NEGATIVE NEGATIVE Final    Comment: (NOTE) The Xpert Xpress SARS-CoV-2/FLU/RSV plus assay  is intended as an aid in the diagnosis of influenza from Nasopharyngeal swab specimens and should not be used as a sole basis for treatment. Nasal washings and aspirates are unacceptable for Xpert Xpress SARS-CoV-2/FLU/RSV testing.  Fact Sheet for Patients: EntrepreneurPulse.com.au  Fact Sheet for Healthcare Providers: IncredibleEmployment.be  This test is not yet approved or cleared by the [place] FDA and has been authorized for detection and/or diagnosis of SARS-CoV-2 by FDA under an Emergency Use Authorization (EUA). This EUA will remain in effect (meaning this test can be used) for the duration of the COVID-19 declaration under Section 564(b)(1)  of the Act, 21 U.S.C. section 360bbb-3(b)(1), unless the authorization is terminated or revoked.  Performed at [facility], [address], [place], [place] [address]           Radiology Studies: DG Chest 1 View  Result Date: 12/12/2021 CLINICAL DATA:  Right hip pain EXAM: CHEST  1 VIEW COMPARISON:  Chest CT dated June 20, 2020 FINDINGS: Cardiac and mediastinal contours within normal limits. Elevation of the left hemidiaphragm. Lungs are clear. No large pleural effusion or evidence pneumothorax. IMPRESSION: No active disease. Electronically Signed   By: Yetta Glassman M.D.   On: 12/12/2021 14:57   CT Head Wo Contrast  Result Date: 12/12/2021 CLINICAL DATA:  Unwitnessed fall. EXAM: CT HEAD WITHOUT CONTRAST CT CERVICAL SPINE WITHOUT CONTRAST TECHNIQUE: Multidetector CT imaging of the head and cervical spine was performed following the standard protocol without intravenous contrast. Multiplanar CT image reconstructions of the cervical spine were also generated. COMPARISON:  None. FINDINGS: CT HEAD FINDINGS Brain: Mild chronic ischemic white matter disease is noted. No mass effect or midline shift is noted. Ventricular size is within normal limits. There is no evidence of mass lesion, hemorrhage or acute infarction. Vascular: No hyperdense vessel or unexpected calcification. Skull: Normal. Negative for fracture or focal lesion. Sinuses/Orbits: No acute finding. Other: None. CT CERVICAL SPINE FINDINGS Alignment: Mild grade 1 anterolisthesis of C3-4 and C4-5 is noted secondary to posterior facet joint hypertrophy. Skull base and vertebrae: No acute fracture. No primary bone lesion or focal pathologic process. Soft tissues and spinal canal: No prevertebral fluid or swelling. No visible canal hematoma. Disc levels: Severe degenerative disc disease is noted at C5-6 and C6-7 with anterior posterior osteophyte formation. Upper chest: Negative. Other: Degenerative changes are seen involving  posterior facet joints bilaterally. IMPRESSION: No acute intracranial abnormality seen. Severe multilevel degenerative disc disease. No acute abnormality seen in the cervical spine. Electronically Signed   By: Marijo Conception M.D.   On: 12/12/2021 15:23   CT Angio Chest Pulmonary Embolism (PE) W or WO Contrast  Result Date: 12/13/2021 CLINICAL DATA:  Syncopal episode with hypoxia. EXAM: CT ANGIOGRAPHY CHEST WITH CONTRAST TECHNIQUE: Multidetector CT imaging of the chest was performed using the standard protocol during bolus administration of intravenous contrast. Multiplanar CT image reconstructions and MIPs were obtained to evaluate the vascular anatomy. CONTRAST:  63mL OMNIPAQUE IOHEXOL 350 MG/ML SOLN COMPARISON:  Portable chest today, chest CT without contrast 06/20/2020. FINDINGS: Factors affecting image quality: Abundant respiratory motion. Cardiovascular: Pulmonary arteries are normal in caliber. There is a suspected segmental arterial filling defect suspicious for thrombus the medial segment of the right middle lobe on series 4 axial images 50 and 51. No other arterial filling defect is seen but the subsegmental arteries are obscured by abundant respiratory motion. This would be a very small embolic burden if truly present. The cardiac size is normal. Coronary arteries are  heavily calcified. There is no pericardial effusion. There is lipomatous hypertrophy of the intra-atrial septum. There is aortic and great vessel atherosclerosis without dissection, moderate descending segment tortuosity, and slightly aneurysmal ascending segment measuring 4.2 cm. The remainder is within normal caliber limits. Mediastinum/Nodes: No enlarged mediastinal, hilar, or axillary lymph nodes. Thyroid gland, trachea, and esophagus demonstrate no significant findings. Lungs/Pleura: There is mild central bronchial thickening in the lower lobes consistent with bronchitis. This was previously more pronounced. The lungs are mildly  emphysematous with centrilobular changes predominating. No active lung infiltrate is seen. A few tiny noncalcified lung base nodules are again noted up to 5 mm. All are unchanged. There are areas of chronic scarring in the lower lung fields. There is no pleural effusion or pneumothorax. Mild chronic elevation left hemidiaphragm. Upper Abdomen: Gallbladder is distended and contains stones measuring up to 2.7 cm. No biliary dilatation. Postsurgical changes of the stomach are chronically seen. Musculoskeletal: There is osteopenia and degenerative changes of the spine. No focal osseous lesion in the thorax. Review of the MIP images confirms the above findings. IMPRESSION: 1. Study is motion-limited. There is a questionable medial segmental right middle lobe arterial filling defect on 2 images. There is no evidence of right heart strain or further visible embolus, but the subsegmental arterial bed is obscured due to breathing motion. 2. 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 Aortic atherosclerosis. 3. Pulmonary emphysema, with several scattered subcentimeter lung base nodules, unchanged, findings of at least mild lower lobe bronchitis without evidence of pneumonia. 4. Cholelithiasis with distended gallbladder without visible wall thickening. The gallbladder incompletely visualized and not well seen due to breathing motion. 5. Osteopenia and degenerative change. 6. Calcific CAD.  Lipomatous hypertrophy interatrial septum. Electronically Signed   By: Telford Nab M.D.   On: 12/13/2021 03:36   CT Cervical Spine Wo Contrast  Result Date: 12/12/2021 CLINICAL DATA:  Unwitnessed fall. EXAM: CT HEAD WITHOUT CONTRAST CT CERVICAL SPINE WITHOUT CONTRAST TECHNIQUE: Multidetector CT imaging of the head and cervical spine was performed following the standard protocol without intravenous contrast. Multiplanar CT image reconstructions of the cervical spine were also generated. COMPARISON:  None. FINDINGS: CT HEAD FINDINGS  Brain: Mild chronic ischemic white matter disease is noted. No mass effect or midline shift is noted. Ventricular size is within normal limits. There is no evidence of mass lesion, hemorrhage or acute infarction. Vascular: No hyperdense vessel or unexpected calcification. Skull: Normal. Negative for fracture or focal lesion. Sinuses/Orbits: No acute finding. Other: None. CT CERVICAL SPINE FINDINGS Alignment: Mild grade 1 anterolisthesis of C3-4 and C4-5 is noted secondary to posterior facet joint hypertrophy. Skull base and vertebrae: No acute fracture. No primary bone lesion or focal pathologic process. Soft tissues and spinal canal: No prevertebral fluid or swelling. No visible canal hematoma. Disc levels: Severe degenerative disc disease is noted at C5-6 and C6-7 with anterior posterior osteophyte formation. Upper chest: Negative. Other: Degenerative changes are seen involving posterior facet joints bilaterally. IMPRESSION: No acute intracranial abnormality seen. Severe multilevel degenerative disc disease. No acute abnormality seen in the cervical spine. Electronically Signed   By: Marijo Conception M.D.   On: 12/12/2021 15:23   CT Hip Right Wo Contrast  Result Date: 12/12/2021 CLINICAL DATA:  [place].  Hip fracture. EXAM: CT OF THE RIGHT HIP WITHOUT CONTRAST TECHNIQUE: Multidetector CT imaging of the right hip was performed according to the standard protocol. Multiplanar CT image reconstructions were also generated. COMPARISON:  Radiographs, same date. FINDINGS: There is a complex  comminuted intertrochanteric fracture of the right hip with moderate impaction. The femoral head neck are intact. No acetabular fracture. The visualized right hemipelvic bony structures are intact. Chondrocalcinosis noted at the pubic symphysis. Associated soft tissue swelling and intramuscular hemorrhage involving the right hip and thigh muscles. Benign-appearing subcutaneous lipoma noted overlying the right hip musculature.  IMPRESSION: 1. Complex comminuted intertrochanteric fracture of the right hip with moderate impaction. 2. Associated soft tissue swelling and intramuscular hemorrhage involving the right hip and thigh muscles. Electronically Signed   By: Marijo Sanes M.D.   On: 12/12/2021 22:02   DG C-Arm 1-60 Min-No Report  Result Date: 12/13/2021 CLINICAL DATA:  Intramedullary nail. EXAM: DG HIP (WITH OR WITHOUT PELVIS) 2-3V RIGHT; DG C-ARM 1-60 MIN-NO REPORT COMPARISON:  Fracture of the RIGHT hip. FINDINGS: Post intramedullary nail placement with dynamic hip screw. No unexpected immediate postprocedural complications on limited assessment. Rod extends to the distal femur. Distal interlocking screws are also in place. Soft tissue gas is noted about the surgical site. FLUOROSCOPY TIME:  2 minutes 6 seconds Fluoroscopy dose: 13.86 mGy IMPRESSION: Status post ORIF of the right hip. No unexpected postprocedural complications. Electronically Signed   By: Zetta Bills M.D.   On: 12/13/2021 15:39   DG HIP UNILAT WITH PELVIS 2-3 VIEWS RIGHT  Result Date: 12/13/2021 CLINICAL DATA:  Intramedullary nail. EXAM: DG HIP (WITH OR WITHOUT PELVIS) 2-3V RIGHT; DG C-ARM 1-60 MIN-NO REPORT COMPARISON:  Fracture of the RIGHT hip. FINDINGS: Post intramedullary nail placement with dynamic hip screw. No unexpected immediate postprocedural complications on limited assessment. Rod extends to the distal femur. Distal interlocking screws are also in place. Soft tissue gas is noted about the surgical site. FLUOROSCOPY TIME:  2 minutes 6 seconds Fluoroscopy dose: 13.86 mGy IMPRESSION: Status post ORIF of the right hip. No unexpected postprocedural complications. Electronically Signed   By: Zetta Bills M.D.   On: 12/13/2021 15:39   DG Hip Unilat With Pelvis 2-3 Views Right  Result Date: 12/12/2021 CLINICAL DATA:  RIGHT leg pain, deformity and dementia. EXAM: DG HIP (WITH OR WITHOUT PELVIS) 2-3V RIGHT COMPARISON:  None FINDINGS: Over riding,  fracture with both Verus and apex anterior angulation involving the intratrochanteric RIGHT femur and associated with mild comminution. Femoral head is located. LEFT hip unremarkable on AP projection. No sign of fracture of the bony pelvis. Degenerative changes in the lower lumbar spine. IMPRESSION: Angulated, comminuted, and overriding intertrochanteric fracture of the RIGHT femur. Electronically Signed   By: Zetta Bills M.D.   On: 12/12/2021 14:58   DG FEMUR, MIN 2 VIEWS RIGHT  Result Date: 12/13/2021 CLINICAL DATA:  Postop right femur EXAM: RIGHT FEMUR 2 VIEWS COMPARISON:  12/13/2021, 12/12/2021 FINDINGS: Interval intramedullary rodding of comminuted right intertrochanteric fracture. Displaced lesser trochanteric fracture fragment. Hardware is intact. Gas in the soft tissues consistent with recent surgery IMPRESSION: Interval surgical fixation of comminuted right intertrochanteric fracture with expected postsurgical change Electronically Signed   By: Donavan Foil M.D.   On: 12/13/2021 17:19    Scheduled Meds:  acetaminophen  500 mg Oral Q6H   carvedilol  6.25 mg Oral BID WC   Chlorhexidine Gluconate Cloth  6 each Topical Daily   cholecalciferol  2,000 Units Oral Daily   docusate sodium  100 mg Oral BID   enoxaparin (LOVENOX) injection  30 mg Subcutaneous Q24H   hydrALAZINE  50 mg Oral Daily   ipratropium  2 spray Nasal TID   losartan  100 mg Oral Daily   rosuvastatin  40 mg Oral Daily   senna  1 tablet Oral BID   traMADol  50 mg Oral Q6H   Continuous Infusions:  sodium chloride 100 mL/hr at 12/14/21 0509   methocarbamol (ROBAXIN) IV       LOS: 2 days   Time spent: 53min  Kabe Mckoy C Jethro Radke, DO [facility]  If 7PM-7AM, please contact night-coverage [email or web address]  12/14/2021, 7:16 AM

## 2021-12-14 NOTE — Progress Notes (Signed)
Dr. Avon Gully and Ortho MD made aware of pts low BP. Pt drowsy, but responds to voice. Holding all antihypertensives at this time. RN will continue to monitor closely. No new orders a this time.    12/14/21 1245  Assess: MEWS Score  Temp 98 F (36.7 C)  BP (!) 80/56  Pulse Rate 70  Resp 15  SpO2 95 %  O2 Device Nasal Cannula  O2 Flow Rate (L/min) 2 L/min  Assess: MEWS Score  MEWS Temp 0  MEWS Systolic 2  MEWS Pulse 0  MEWS RR 0  MEWS LOC 1  MEWS Score 3  MEWS Score Color Yellow  Assess: if the MEWS score is Yellow or Red  Were vital signs taken at a resting state? Yes  Focused Assessment No change from prior assessment  Does the patient meet 2 or more of the SIRS criteria? No  MEWS guidelines implemented *See Row Information* Yes  Treat  Pain Scale PAINAD  Pain Score Asleep  Breathing 0  Negative Vocalization 0  Facial Expression 0  Body Language 0  Consolability 0  PAINAD Score 0  Complains of Restless  Interventions Relaxation  Patients response to intervention Effective  Take Vital Signs  Increase Vital Sign Frequency  Yellow: Q 2hr X 2 then Q 4hr X 2, if remains yellow, continue Q 4hrs  Escalate  MEWS: Escalate Yellow: discuss with charge nurse/RN and consider discussing with provider and RRT  Notify: Charge Nurse/RN  Name of Charge Nurse/RN Notified Teresa, RN  Date Charge Nurse/RN Notified 12/14/21  Time Charge Nurse/RN Notified 1245  Notify: Provider  Provider Name/Title Dr. Avon Gully  Date Provider Notified 12/14/21  Time Provider Notified 1200  Notification Type Page  Notification Reason Change in status  Provider response Evaluate remotely;No new orders  Date of Provider Response 12/14/21  Time of Provider Response 1205  Document  Patient Outcome Stabilized after interventions  Progress note created (see row info) Yes  Assess: SIRS CRITERIA  SIRS Temperature  0  SIRS Pulse 0  SIRS Respirations  0  SIRS WBC 0  SIRS Score Sum  0

## 2021-12-14 NOTE — Progress Notes (Signed)
MD notified that pts BP is low at this time. Pt is drowsy and BP is 74/56 with MAP of 62. No new orders placed by MD at this time. MD asks RN to hold all BP meds for the day. RN will recheck BP in 1 hour and update MD accordingly.

## 2021-12-14 NOTE — NC FL2 (Signed)
[address] LEVEL OF CARE SCREENING TOOL     IDENTIFICATION  Patient Name: Morgan Stewart Birthdate: 09/11/1946 Sex: female Admission Date (Current Location): 12/12/2021  [facility] and [place] Number:  Engineering geologist and Address:  [facility], 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   Thrombocytopenia ([place]) 11/26/2019   COPD (chronic obstructive pulmonary disease) ([place]) 11/26/2019   Gout 11/26/2019   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   LVH (left ventricular hypertrophy) 05/06/2019   Palpitations 05/03/2019   Chronic kidney disease (CKD) stage G3a/A1, moderately decreased glomerular filtration rate (GFR) between 45-59 mL/min/1.73 square meter and albuminuria creatinine ratio less than 30 mg/g ([place]) 04/07/2019   Vitamin D deficiency 04/07/2019   Low vitamin B12 level 02/03/2019   Left hip pain 08/11/2018   Radicular pain of lumbosacral region 08/11/2018   Low vitamin D level 08/03/2018   Opacity of lung on imaging study 10/18/2015   Microscopic hematuria 03/08/2014   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 09/10/2012   Hallux valgus with bunions 09/10/2012   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   Hyperlipidemia 10/14/2011   Coronary artery disease involving native coronary artery of native heart 07/09/2011   Essential (primary) hypertension  07/09/2011    Orientation RESPIRATION BLADDER Height & Weight     Self  O2 Incontinent Weight: 158 lb 11.7 oz (72 kg) Height:  5\' 6"  (167.6 cm)  BEHAVIORAL SYMPTOMS/MOOD NEUROLOGICAL BOWEL NUTRITION STATUS        Diet (regular, thin liquids)  AMBULATORY STATUS COMMUNICATION OF NEEDS Skin   Total Care Verbally  (incision R hip, wound R hand, skin tear sacrum)                       Personal Care Assistance Level of Assistance  Bathing, Feeding, Dressing Bathing Assistance: Maximum assistance Feeding assistance: Maximum assistance Dressing Assistance: Maximum assistance     Functional Limitations Info             SPECIAL CARE FACTORS FREQUENCY  PT (By licensed PT), OT (By licensed OT)     PT Frequency: 5 times per week OT Frequency: 5 times per week            Contractures      Additional Factors Info  Code Status, Allergies Code Status Info: full code Allergies Info: Allergies: Ace Inhibitors, Doxycycline, Codeine, Orlistat, Oxycodone, Oxycodone-acetaminophen, Sulfa Antibiotics           Current Medications (12/14/2021):  This is the current hospital active medication list Current Facility-Administered Medications  Medication Dose Route Frequency Provider Last Rate Last Admin   0.9 %  sodium chloride infusion   Intravenous Continuous Thornton Park, MD 100 mL/hr at 12/14/21 0509 New Bag at 12/14/21 0509   acetaminophen (TYLENOL) tablet 650 mg  650 mg Oral Q6H PRN Thornton Park, MD  Or   acetaminophen (TYLENOL) suppository 650 mg  650 mg Rectal Q6H PRN Thornton Park, MD       acetaminophen (TYLENOL) tablet 500 mg  500 mg Oral Q6H Thornton Park, MD   500 mg at 12/14/21 8299   albuterol (PROVENTIL) (2.5 MG/3ML) 0.083% nebulizer solution 2.5 mg  2.5 mg Nebulization Q4H PRN Thornton Park, MD       alum & mag hydroxide-simeth (MAALOX/MYLANTA) 200-200-20 MG/5ML suspension 30 mL  30 mL Oral Q4H PRN Thornton Park, MD       bisacodyl  (DULCOLAX) suppository 10 mg  10 mg Rectal Daily PRN Thornton Park, MD       Chlorhexidine Gluconate Cloth 2 % PADS 6 each  6 each Topical Daily Mansy, Jan A, MD   6 each at 12/14/21 1109   cholecalciferol (VITAMIN D3) tablet 2,000 Units  2,000 Units Oral Daily Thornton Park, MD   2,000 Units at 12/14/21 1008   docusate sodium (COLACE) capsule 100 mg  100 mg Oral BID Thornton Park, MD   100 mg at 12/14/21 1008   enoxaparin (LOVENOX) injection 30 mg  30 mg Subcutaneous Q24H Thornton Park, MD   30 mg at 12/14/21 1012   HYDROcodone-acetaminophen (NORCO/VICODIN) 5-325 MG per tablet 1-2 tablet  1-2 tablet Oral Q4H PRN Thornton Park, MD   1 tablet at 12/14/21 0201   ipratropium (ATROVENT) 0.06 % nasal spray 2 spray  2 spray Nasal TID Thornton Park, MD   2 spray at 12/14/21 1022   LORazepam (ATIVAN) tablet 0.5 mg  0.5 mg Oral Q12H PRN Little Ishikawa, MD       magnesium hydroxide (MILK OF MAGNESIA) suspension 30 mL  30 mL Oral Daily PRN Thornton Park, MD       melatonin tablet 2.5 mg  2.5 mg Oral QHS PRN Thornton Park, MD   2.5 mg at 12/13/21 0148   methocarbamol (ROBAXIN) tablet 500 mg  500 mg Oral Q6H PRN Thornton Park, MD       Or   methocarbamol (ROBAXIN) 500 mg in dextrose 5 % 50 mL IVPB  500 mg Intravenous Q6H PRN Thornton Park, MD       morphine 2 MG/ML injection 0.5-1 mg  0.5-1 mg Intravenous Q2H PRN Thornton Park, MD   1 mg at 12/14/21 0111   ondansetron (ZOFRAN) tablet 4 mg  4 mg Oral Q6H PRN Thornton Park, MD       Or   ondansetron [facility]) injection 4 mg  4 mg Intravenous Q6H PRN Thornton Park, MD       polyethylene glycol (MIRALAX / GLYCOLAX) packet 17 g  17 g Oral Daily PRN Thornton Park, MD       rosuvastatin (CRESTOR) tablet 40 mg  40 mg Oral Daily Thornton Park, MD   40 mg at 12/14/21 1008   senna (SENOKOT) tablet 8.6 mg  1 tablet Oral BID Thornton Park, MD   8.6 mg at 12/14/21 1009   traMADol (ULTRAM) tablet 50 mg  50 mg Oral Q6H  Thornton Park, MD   50 mg at 12/14/21 3716   traZODone (DESYREL) tablet 50-100 mg  50-100 mg Oral QHS PRN Thornton Park, MD   100 mg at 12/13/21 0148     Discharge Medications: Please see discharge summary for a list of discharge medications.  Relevant Imaging Results:  Relevant Lab Results:   Additional Information SS #: [id number]  [place], LCSW

## 2021-12-14 NOTE — Progress Notes (Addendum)
Subjective:  POD #1 s/p intramedullary fixation for right intertrochanteric hip fracture.  Patient has dementia and is unable to provide a detailed history.  Her son was at the bedside.  Patient indicates that she is not having significant right hip pain.  Objective:   VITALS:   Vitals:   12/13/21 2333 12/14/21 0422 12/14/21 0814 12/14/21 1145  BP: 105/74 (!) 143/82 124/73 (!) 74/56  Pulse: 86 91 68 68  Resp: 16 20 14 14   Temp: 97.6 F (36.4 C) 97.9 F (36.6 C) 98 F (36.7 C) 97.9 F (36.6 C)  TempSrc:      SpO2: 96%  99% 95%  Weight:      Height:        PHYSICAL EXAM: Right lower extremity Neurovascular intact Sensation intact distally Intact pulses distally Dorsiflexion/Plantar flexion intact Incision: dressing C/D/I No cellulitis present Compartment soft  LABS  Results for orders placed or performed during the hospital encounter of 12/12/21 (from the past 24 hour(s))  CK     Status: Abnormal   Collection Time: 12/13/21  6:01 PM  Result Value Ref Range   Total CK 980 (H) 38 - 234 U/L  CBC     Status: Abnormal   Collection Time: 12/14/21  6:49 AM  Result Value Ref Range   WBC 6.5 4.0 - 10.5 K/uL   RBC 2.76 (L) 3.87 - 5.11 MIL/uL   Hemoglobin 8.7 (L) 12.0 - 15.0 g/dL   HCT 26.5 (L) 36.0 - 46.0 %   MCV 96.0 80.0 - 100.0 fL   MCH 31.5 26.0 - 34.0 pg   MCHC 32.8 30.0 - 36.0 g/dL   RDW 13.4 11.5 - 15.5 %   Platelets 81 (L) 150 - 400 K/uL   nRBC 0.0 0.0 - 0.2 %  Basic metabolic panel     Status: Abnormal   Collection Time: 12/14/21  6:49 AM  Result Value Ref Range   Sodium 142 135 - 145 mmol/L   Potassium 4.0 3.5 - 5.1 mmol/L   Chloride 110 98 - 111 mmol/L   CO2 24 22 - 32 mmol/L   Glucose, Bld 103 (H) 70 - 99 mg/dL   BUN 34 (H) 8 - 23 mg/dL   Creatinine, Ser 0.96 0.44 - 1.00 mg/dL   Calcium 8.8 (L) 8.9 - 10.3 mg/dL   GFR, Estimated >60 >60 mL/min   Anion gap 8 5 - 15    DG Chest 1 View  Result Date: 12/12/2021 CLINICAL DATA:  Right hip pain EXAM:  CHEST  1 VIEW COMPARISON:  Chest CT dated June 20, 2020 FINDINGS: Cardiac and mediastinal contours within normal limits. Elevation of the left hemidiaphragm. Lungs are clear. No large pleural effusion or evidence pneumothorax. IMPRESSION: No active disease. Electronically Signed   By: Yetta Glassman M.D.   On: 12/12/2021 14:57   CT Head Wo Contrast  Result Date: 12/12/2021 CLINICAL DATA:  Unwitnessed fall. EXAM: CT HEAD WITHOUT CONTRAST CT CERVICAL SPINE WITHOUT CONTRAST TECHNIQUE: Multidetector CT imaging of the head and cervical spine was performed following the standard protocol without intravenous contrast. Multiplanar CT image reconstructions of the cervical spine were also generated. COMPARISON:  None. FINDINGS: CT HEAD FINDINGS Brain: Mild chronic ischemic white matter disease is noted. No mass effect or midline shift is noted. Ventricular size is within normal limits. There is no evidence of mass lesion, hemorrhage or acute infarction. Vascular: No hyperdense vessel or unexpected calcification. Skull: Normal. Negative for fracture or focal lesion. Sinuses/Orbits: No acute finding.  Other: None. CT CERVICAL SPINE FINDINGS Alignment: Mild grade 1 anterolisthesis of C3-4 and C4-5 is noted secondary to posterior facet joint hypertrophy. Skull base and vertebrae: No acute fracture. No primary bone lesion or focal pathologic process. Soft tissues and spinal canal: No prevertebral fluid or swelling. No visible canal hematoma. Disc levels: Severe degenerative disc disease is noted at C5-6 and C6-7 with anterior posterior osteophyte formation. Upper chest: Negative. Other: Degenerative changes are seen involving posterior facet joints bilaterally. IMPRESSION: No acute intracranial abnormality seen. Severe multilevel degenerative disc disease. No acute abnormality seen in the cervical spine. Electronically Signed   By: Marijo Conception M.D.   On: 12/12/2021 15:23   CT Angio Chest Pulmonary Embolism (PE) W or WO  Contrast  Result Date: 12/13/2021 CLINICAL DATA:  Syncopal episode with hypoxia. EXAM: CT ANGIOGRAPHY CHEST WITH CONTRAST TECHNIQUE: Multidetector CT imaging of the chest was performed using the standard protocol during bolus administration of intravenous contrast. Multiplanar CT image reconstructions and MIPs were obtained to evaluate the vascular anatomy. CONTRAST:  38mL OMNIPAQUE IOHEXOL 350 MG/ML SOLN COMPARISON:  Portable chest today, chest CT without contrast 06/20/2020. FINDINGS: Factors affecting image quality: Abundant respiratory motion. Cardiovascular: Pulmonary arteries are normal in caliber. There is a suspected segmental arterial filling defect suspicious for thrombus the medial segment of the right middle lobe on series 4 axial images 50 and 51. No other arterial filling defect is seen but the subsegmental arteries are obscured by abundant respiratory motion. This would be a very small embolic burden if truly present. The cardiac size is normal. Coronary arteries are heavily calcified. There is no pericardial effusion. There is lipomatous hypertrophy of the intra-atrial septum. There is aortic and great vessel atherosclerosis without dissection, moderate descending segment tortuosity, and slightly aneurysmal ascending segment measuring 4.2 cm. The remainder is within normal caliber limits. Mediastinum/Nodes: No enlarged mediastinal, hilar, or axillary lymph nodes. Thyroid gland, trachea, and esophagus demonstrate no significant findings. Lungs/Pleura: There is mild central bronchial thickening in the lower lobes consistent with bronchitis. This was previously more pronounced. The lungs are mildly emphysematous with centrilobular changes predominating. No active lung infiltrate is seen. A few tiny noncalcified lung base nodules are again noted up to 5 mm. All are unchanged. There are areas of chronic scarring in the lower lung fields. There is no pleural effusion or pneumothorax. Mild chronic  elevation left hemidiaphragm. Upper Abdomen: Gallbladder is distended and contains stones measuring up to 2.7 cm. No biliary dilatation. Postsurgical changes of the stomach are chronically seen. Musculoskeletal: There is osteopenia and degenerative changes of the spine. No focal osseous lesion in the thorax. Review of the MIP images confirms the above findings. IMPRESSION: 1. Study is motion-limited. There is a questionable medial segmental right middle lobe arterial filling defect on 2 images. There is no evidence of right heart strain or further visible embolus, but the subsegmental arterial bed is obscured due to breathing motion. 2. 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 Aortic atherosclerosis. 3. Pulmonary emphysema, with several scattered subcentimeter lung base nodules, unchanged, findings of at least mild lower lobe bronchitis without evidence of pneumonia. 4. Cholelithiasis with distended gallbladder without visible wall thickening. The gallbladder incompletely visualized and not well seen due to breathing motion. 5. Osteopenia and degenerative change. 6. Calcific CAD.  Lipomatous hypertrophy interatrial septum. Electronically Signed   By: Telford Nab M.D.   On: 12/13/2021 03:36   CT Cervical Spine Wo Contrast  Result Date: 12/12/2021 CLINICAL DATA:  Unwitnessed fall. EXAM:  CT HEAD WITHOUT CONTRAST CT CERVICAL SPINE WITHOUT CONTRAST TECHNIQUE: Multidetector CT imaging of the head and cervical spine was performed following the standard protocol without intravenous contrast. Multiplanar CT image reconstructions of the cervical spine were also generated. COMPARISON:  None. FINDINGS: CT HEAD FINDINGS Brain: Mild chronic ischemic white matter disease is noted. No mass effect or midline shift is noted. Ventricular size is within normal limits. There is no evidence of mass lesion, hemorrhage or acute infarction. Vascular: No hyperdense vessel or unexpected calcification. Skull: Normal. Negative for  fracture or focal lesion. Sinuses/Orbits: No acute finding. Other: None. CT CERVICAL SPINE FINDINGS Alignment: Mild grade 1 anterolisthesis of C3-4 and C4-5 is noted secondary to posterior facet joint hypertrophy. Skull base and vertebrae: No acute fracture. No primary bone lesion or focal pathologic process. Soft tissues and spinal canal: No prevertebral fluid or swelling. No visible canal hematoma. Disc levels: Severe degenerative disc disease is noted at C5-6 and C6-7 with anterior posterior osteophyte formation. Upper chest: Negative. Other: Degenerative changes are seen involving posterior facet joints bilaterally. IMPRESSION: No acute intracranial abnormality seen. Severe multilevel degenerative disc disease. No acute abnormality seen in the cervical spine. Electronically Signed   By: Marijo Conception M.D.   On: 12/12/2021 15:23   CT Hip Right Wo Contrast  Result Date: 12/12/2021 CLINICAL DATA:  [place].  Hip fracture. EXAM: CT OF THE RIGHT HIP WITHOUT CONTRAST TECHNIQUE: Multidetector CT imaging of the right hip was performed according to the standard protocol. Multiplanar CT image reconstructions were also generated. COMPARISON:  Radiographs, same date. FINDINGS: There is a complex comminuted intertrochanteric fracture of the right hip with moderate impaction. The femoral head neck are intact. No acetabular fracture. The visualized right hemipelvic bony structures are intact. Chondrocalcinosis noted at the pubic symphysis. Associated soft tissue swelling and intramuscular hemorrhage involving the right hip and thigh muscles. Benign-appearing subcutaneous lipoma noted overlying the right hip musculature. IMPRESSION: 1. Complex comminuted intertrochanteric fracture of the right hip with moderate impaction. 2. Associated soft tissue swelling and intramuscular hemorrhage involving the right hip and thigh muscles. Electronically Signed   By: Marijo Sanes M.D.   On: 12/12/2021 22:02   DG C-Arm 1-60 Min-No  Report  Result Date: 12/13/2021 CLINICAL DATA:  Intramedullary nail. EXAM: DG HIP (WITH OR WITHOUT PELVIS) 2-3V RIGHT; DG C-ARM 1-60 MIN-NO REPORT COMPARISON:  Fracture of the RIGHT hip. FINDINGS: Post intramedullary nail placement with dynamic hip screw. No unexpected immediate postprocedural complications on limited assessment. Rod extends to the distal femur. Distal interlocking screws are also in place. Soft tissue gas is noted about the surgical site. FLUOROSCOPY TIME:  2 minutes 6 seconds Fluoroscopy dose: 13.86 mGy IMPRESSION: Status post ORIF of the right hip. No unexpected postprocedural complications. Electronically Signed   By: Zetta Bills M.D.   On: 12/13/2021 15:39   DG HIP UNILAT WITH PELVIS 2-3 VIEWS RIGHT  Result Date: 12/13/2021 CLINICAL DATA:  Intramedullary nail. EXAM: DG HIP (WITH OR WITHOUT PELVIS) 2-3V RIGHT; DG C-ARM 1-60 MIN-NO REPORT COMPARISON:  Fracture of the RIGHT hip. FINDINGS: Post intramedullary nail placement with dynamic hip screw. No unexpected immediate postprocedural complications on limited assessment. Rod extends to the distal femur. Distal interlocking screws are also in place. Soft tissue gas is noted about the surgical site. FLUOROSCOPY TIME:  2 minutes 6 seconds Fluoroscopy dose: 13.86 mGy IMPRESSION: Status post ORIF of the right hip. No unexpected postprocedural complications. Electronically Signed   By: Zetta Bills M.D.   On:  12/13/2021 15:39   DG Hip Unilat With Pelvis 2-3 Views Right  Result Date: 12/12/2021 CLINICAL DATA:  RIGHT leg pain, deformity and dementia. EXAM: DG HIP (WITH OR WITHOUT PELVIS) 2-3V RIGHT COMPARISON:  None FINDINGS: Over riding, fracture with both Verus and apex anterior angulation involving the intratrochanteric RIGHT femur and associated with mild comminution. Femoral head is located. LEFT hip unremarkable on AP projection. No sign of fracture of the bony pelvis. Degenerative changes in the lower lumbar spine. IMPRESSION:  Angulated, comminuted, and overriding intertrochanteric fracture of the RIGHT femur. Electronically Signed   By: Zetta Bills M.D.   On: 12/12/2021 14:58   DG FEMUR, MIN 2 VIEWS RIGHT  Result Date: 12/13/2021 CLINICAL DATA:  Postop right femur EXAM: RIGHT FEMUR 2 VIEWS COMPARISON:  12/13/2021, 12/12/2021 FINDINGS: Interval intramedullary rodding of comminuted right intertrochanteric fracture. Displaced lesser trochanteric fracture fragment. Hardware is intact. Gas in the soft tissues consistent with recent surgery IMPRESSION: Interval surgical fixation of comminuted right intertrochanteric fracture with expected postsurgical change Electronically Signed   By: Donavan Foil M.D.   On: 12/13/2021 17:19    Assessment/Plan: 1 Day Post-Op   Principal Problem:   Closed right hip fracture [facility])  Patient is stable from an orthopedic standpoint postop.  Continue physical therapy and occupational therapy as tolerated.  Patient will start Lovenox for DVT prophylaxis today.  Patient will need a skilled nursing facility upon discharge as she lives alone and has a history of dementia.  Patient has a hemoglobin and hematocrit within acceptable limits.  No transfusion is needed at this time.  Patient recently had an episode of hypotension with blood pressure of 74/56.  Patient is being followed by the medical service.    Thornton Park , MD 12/14/2021, 12:32 PM

## 2021-12-14 NOTE — Progress Notes (Signed)
Re: Morgan Stewart Date of Birth: 02-21-2046 Date: 12/14/21   To Whom It May Concern:  Please be advised that the above-name patient's dementia diagnosis is primary and supersedes his mental illness.

## 2021-12-14 NOTE — Anesthesia Postprocedure Evaluation (Signed)
Anesthesia Post Note  Patient: Morgan Stewart  Procedure(s) Performed: INTRAMEDULLARY (IM) NAIL INTERTROCHANTRIC (Right: Hip)  Patient location during evaluation: PACU Anesthesia Type: General Level of consciousness: confused (as per preop state) Pain management: pain level controlled Vital Signs Assessment: post-procedure vital signs reviewed and stable Respiratory status: spontaneous breathing, nonlabored ventilation, respiratory function stable and patient connected to nasal cannula oxygen Cardiovascular status: blood pressure returned to baseline and stable Postop Assessment: no apparent nausea or vomiting Anesthetic complications: no   No notable events documented.   Last Vitals:  Vitals:   12/13/21 2333 12/14/21 0422  BP: 105/74 (!) 143/82  Pulse: 86 91  Resp: 16 20  Temp: 36.4 C 36.6 C  SpO2: 96%     Last Pain:  Vitals:   12/14/21 0246  TempSrc:   PainSc: Asleep                 Arita Miss

## 2021-12-15 DIAGNOSIS — S72001A Fracture of unspecified part of neck of right femur, initial encounter for closed fracture: Secondary | ICD-10-CM | POA: Diagnosis not present

## 2021-12-15 NOTE — Progress Notes (Cosign Needed)
RE: Morgan Stewart Date of Birth: 16-Oct-2046 Date: 12/15/21   To Whom It May Concern:  Please be advised that the above-named patient will require a short-term nursing home stay - anticipated 30 days or less for rehabilitation and strengthening.  The plan is for return home.

## 2021-12-15 NOTE — Progress Notes (Signed)
Physical Therapy Treatment Patient Details Name: Morgan Stewart MRN: [id number] DOB: July 02, 1946 Today's Date: 12/15/2021   History of Present Illness Pt is a 75 y/o F admitted on 12/12/21 with c/c of an unwitnessed fall. Pt was found to have displaced R intertrochanteric hip fracture & underwent intramedullary fixation on 12/13/21 by Dr. Mack Guise. Pt also had subsequent acute rhabdomyolysis s/p fall. PMH: CAD, HTN, COPD, DJD, gout, dyslipidemia, vitamin D & B12 deficiencies, tobacco abuse    PT Comments    Patient wearing one mitt and RN finishing up replacing IV catheter upon arrival. Patient unable to express herself effectively verbally and follows only some commands intermittently for short periods. She is able to be calmed with touch/voice most of the time but is very fidgety and repeatedly attempting to take of clothing and O2 cannula. RN present throughout session assisting with mobility intermittently and assisting patient with changing soiled gown and bedclothes and replacing sacral protection. Unable to get reliable SpO2 reading and patient mouth breathing throughout session while on 3L/min O2. Patient required total A +2 for supine <> sit and was unable to sit up straight on the bed, pushing towards the left with the right hand and resisting attempts to help right her. Did allow her R hand to be moved to midline for short periods decreasing left lean. Patient performed sit <> stand at edge of bed with max A +2 to RW with support under bilateral shoulders and at L ischial tuberosity. She was able to remain standing for ~ 10 min with BUE support on RW and min-max A from PT at L shoulder and L ischial tuberosity while bed was cleaned and sacral protection replaced. Patient was unable to safely attempt steps due to cognition and pain. Patient was returned to bed and repositioned with RN attending to her at end of session. Patient was able to progress to standing at bedside this session but  continues to have severe functional limitations and recommendation for short term rehab upon discharge from acute care setting remains appropriate. Patient would benefit from skilled physical therapy to address impairments and functional limitations to work towards stated goals and return to PLOF or maximal functional independence.      Recommendations for follow up therapy are one component of a multi-disciplinary discharge planning process, led by the attending physician.  Recommendations may be updated based on patient status, additional functional criteria and insurance authorization.  Follow Up Recommendations  Skilled nursing-short term rehab (<3 hours/day)     Assistance Recommended at Discharge Frequent or constant Supervision/Assistance  Equipment Recommendations   (TBD in next venue)    Recommendations for Other Services       Precautions / Restrictions Precautions Precautions: Fall Restrictions Weight Bearing Restrictions: Yes RLE Weight Bearing: Weight bearing as tolerated     Mobility  Bed Mobility Overal bed mobility: Needs Assistance Bed Mobility: Supine to Sit;Sit to Supine Rolling: Total assist   Supine to sit: HOB elevated;Total assist;+2 for physical assistance Sit to supine: Total assist;+2 for physical assistance   General bed mobility comments: Patient cries out and guards R hip when R LE is moved. Resists supine <> sit. PT + RN assisted patient at trunk and B LE.    Transfers Overall transfer level: Needs assistance Equipment used: Rolling walker (2 wheels) Transfers: Sit to/from Stand Sit to Stand: Max assist;+2 physical assistance           General transfer comment: patient transfered sit <> stand from edge of bed to RW  with max A +2 with support under arms and at right ischial tuberosity. Eventually able to stand up almost straight with time and multimodal cuing.    Ambulation/Gait             Pre-gait activities: standing at edge of  bed ~ 10 min with RW and min-max A General Gait Details: ambulation attempt unsafe at this time.   Stairs             Wheelchair Mobility    Modified Rankin (Stroke Patients Only)       Balance Overall balance assessment: Needs assistance Sitting-balance support: Bilateral upper extremity supported;Feet supported Sitting balance-Leahy Scale: Zero Sitting balance - Comments: L lateral lean unable to come to midline despite PT attempting to facilitate & cue pt. Patient continues to push with R UE and keep R LE out to the side to push right. Postural control: Left lateral lean   Standing balance-Leahy Scale: Poor Standing balance comment: Patient able to stand ~ 10 min at edge of bed with RW and min-max A at L arm and L ischial tuberosity with frequent multimodal cuing to stand up fully.                            Cognition Arousal/Alertness: Lethargic Behavior During Therapy: Agitated (consolable) Overall Cognitive Status: History of cognitive impairments - at baseline                                 General Comments: Able to follow some commands intermittantly for short duration. Verbalizes but not in understandable nature.        Exercises Other Exercises Other Exercises: patient practices seated balance with mod A support from PT while RN changed soiled gown, patient practiced standing at edge of bed with RW and min-max A from PT while RN replaced soiled bedclothes and applied new sacral bandage.    General Comments General comments (skin integrity, edema, etc.): patient on 3L/min O2 via nasal cannula, unable to get reliable reading of SpO2 on dynamat or pulse ox 2/2 nailpolish and fidgeting wht hands. Patinet unable to follow commands to close mouth and breathe through nose (mouth breathing entire session).      Pertinent Vitals/Pain Faces Pain Scale: Hurts whole lot Breathing: occasional labored breathing, short period of  hyperventilation Negative Vocalization: repeated troubled calling out, loud moaning/groaning, crying Facial Expression: facial grimacing Body Language: tense, distressed pacing, fidgeting Consolability: distracted or reassured by voice/touch PAINAD Score: 7 Pain Location: R hip Pain Descriptors / Indicators: Grimacing;Discomfort Pain Intervention(s): Limited activity within patient's tolerance;Monitored during session;Repositioned    Home Living                          Prior Function            PT Goals (current goals can now be found in the care plan section) Acute Rehab PT Goals PT Goal Formulation: Patient unable to participate in goal setting Time For Goal Achievement: 12/28/21 Potential to Achieve Goals: Fair    Frequency    7X/week      PT Plan Current plan remains appropriate    Co-evaluation              AM-PAC PT "6 Clicks" Mobility   Outcome Measure  Help needed turning from your back to your side while in a  flat bed without using bedrails?: Total Help needed moving from lying on your back to sitting on the side of a flat bed without using bedrails?: Total Help needed moving to and from a bed to a chair (including a wheelchair)?: Total Help needed standing up from a chair using your arms (e.g., wheelchair or bedside chair)?: Total Help needed to walk in hospital room?: Total Help needed climbing 3-5 steps with a railing? : Total 6 Click Score: 6    End of Session Equipment Utilized During Treatment: Oxygen Activity Tolerance: Treatment limited secondary to agitation;Other (comment) (patient limited by pain and cognition) Patient left: in bed;with bed alarm set;with nursing/sitter in room Nurse Communication: Mobility status PT Visit Diagnosis: Unsteadiness on feet (R26.81);Muscle weakness (generalized) (M62.81);Difficulty in walking, not elsewhere classified (R26.2);Pain Pain - Right/Left: Right Pain - part of body: Hip     Time:  7471-8550 PT Time Calculation (min) (ACUTE ONLY): 26 min  Charges:  $Therapeutic Activity: 23-37 mins                     Everlean Alstrom. Graylon Good, PT, DPT 12/15/21, 11:07 AM

## 2021-12-15 NOTE — Progress Notes (Signed)
PROGRESS NOTE    Morgan Stewart  GTX:[id number] DOB: 01-25-1946 DOA: 12/12/2021 PCP: Lequita Asal, MD (Inactive)   Brief Narrative:  Morgan Stewart is a 75 y.o. Caucasian female with medical history significant for Coronary artery disease, hypertension, COPD, DJD, gout, dyslipidemia, vitamin D deficiency, vitamin B12 deficiency and tobacco abuse, presented to the ER with a [place] of right hip pain after having a fall last night.  It is unclear if she had a syncope or not.  She stated that she could not get up after falling -unwitnessed event as she lives independently at home.  In the ED imaging remarkable for right femur fracture, orthopedics consulted, hospitalist called for admission.  Assessment & Plan:  Closed right hip fracture secondary to presumed mechanical fall, POA Acute rhabdomyolysis, resolving Transient hypoxia secondary to narcotics, resolving -Orthopedics following, status post ORIF 12/29 -Pain currently well controlled, patient somnolent, transient event of hypoxia improving with weaning of narcotics -advance diet -PT/OT ongoing - plan for dispo to SNF given fracture type and age.  Essential hypertension, uncontrolled in the setting of pain. -Continue carvedilol hydralazine and Cozaar -Blood pressure much more reasonable with pain control  Dyslipidemia -continue statin Dementia -transient metabolic encephalopathy(POA) likely secondary to narcotics and pain appears to be resolving, back to baseline today per son at bedside Depression continue Effexor Exar, trazodone  Coronary artery disease. W continue carvedilol, blood pressure control ; holding aspirin perioperatively.  DVT prophylaxis: SCDs. Code Status: full code. Family Communication:  The plan of care was discussed in details with the patient   Status is: Inpatient  Dispo: The patient is from: Home              Anticipated d/c is to: To be determined              Anticipated d/c date is: 48  to 72 hours              Patient currently not medically stable for discharge  Consultants:  Orthopedic surgery  Procedures:  ORIF right femur 12/13/2021  Antimicrobials:  None indicated  Subjective: No acute issues or events overnight, much more awake alert and oriented this morning, still confused which is her baseline per son at bedside but agreeable for PT and interacting/following commands appropriately.  Objective: Vitals:   12/14/21 1653 12/14/21 2051 12/15/21 0100 12/15/21 0407  BP: (!) 93/52 (!) 80/51 94/71 115/80  Pulse: 67 (!) 59 78 90  Resp: 14 14 16 16   Temp: 97.7 F (36.5 C) 97.6 F (36.4 C) (!) 97.5 F (36.4 C) 98.2 F (36.8 C)  TempSrc:      SpO2: 100%     Weight:      Height:        Intake/Output Summary (Last 24 hours) at 12/15/2021 0732 Last data filed at 12/14/2021 2157 Gross per 24 hour  Intake 220 ml  Output 550 ml  Net -330 ml    Filed Weights   12/12/21 1420  Weight: 72 kg    Examination:  General exam: Appears calm and comfortable  Respiratory system: Clear to auscultation. Respiratory effort normal. Cardiovascular system: S1 & S2 heard, RRR. No JVD, murmurs, rubs, gallops or clicks. No pedal edema. Gastrointestinal system: Abdomen is nondistended, soft and nontender. No organomegaly or masses felt. Normal bowel sounds heard. Central nervous system: Alert and oriented. No focal neurological deficits. Extremities: Symmetric 5 x 5 power. Skin: No rashes, lesions or ulcers Psychiatry: Judgement and insight appear normal. Mood & affect appropriate.  Data Reviewed: I have personally reviewed following labs and imaging studies  CBC: Recent Labs  Lab 12/12/21 1506 12/13/21 0332 12/14/21 0649  WBC 10.2 9.5 6.5  NEUTROABS 8.3*  --   --   HGB 12.2 10.4* 8.7*  HCT 36.6 32.1* 26.5*  MCV 94.1 93.6 96.0  PLT 112* 98* 81*    Basic Metabolic Panel: Recent Labs  Lab 12/12/21 1506 12/13/21 0332 12/14/21 0649  NA 144 139 142  K  3.8 4.1 4.0  CL 107 107 110  CO2 25 26 24   GLUCOSE 102* 108* 103*  BUN 37* 35* 34*  CREATININE 1.16* 1.12* 0.96  CALCIUM 10.0 9.3 8.8*    GFR: Estimated Creatinine Clearance: 51.5 mL/min (by C-G formula based on SCr of 0.96 mg/dL). Liver Function Tests: Recent Labs  Lab 12/12/21 1506  AST 82*  ALT 33  ALKPHOS 69  BILITOT 1.7*  PROT 6.6  ALBUMIN 4.1    No results for input(s): LIPASE, AMYLASE in the last 168 hours. No results for input(s): AMMONIA in the last 168 hours. Coagulation Profile: No results for input(s): INR, PROTIME in the last 168 hours. Cardiac Enzymes: Recent Labs  Lab 12/12/21 1506 12/13/21 0332 12/13/21 1801  CKTOTAL 2,439* 1,582* 980*    BNP (last 3 results) No results for input(s): PROBNP in the last 8760 hours. HbA1C: No results for input(s): HGBA1C in the last 72 hours. CBG: No results for input(s): GLUCAP in the last 168 hours. Lipid Profile: No results for input(s): CHOL, HDL, LDLCALC, TRIG, CHOLHDL, LDLDIRECT in the last 72 hours. Thyroid Function Tests: No results for input(s): TSH, T4TOTAL, FREET4, T3FREE, THYROIDAB in the last 72 hours. Anemia Panel: No results for input(s): VITAMINB12, FOLATE, FERRITIN, TIBC, IRON, RETICCTPCT in the last 72 hours. Sepsis Labs: No results for input(s): PROCALCITON, LATICACIDVEN in the last 168 hours.  Recent Results (from the past 240 hour(s))  Resp Panel by RT-PCR (Flu A&B, Covid) Nasopharyngeal Swab     Status: None   Collection Time: 12/12/21  3:06 PM   Specimen: Nasopharyngeal Swab; Nasopharyngeal(NP) swabs in vial transport medium  Result Value Ref Range Status   SARS Coronavirus 2 by RT PCR NEGATIVE NEGATIVE Final    Comment: (NOTE) SARS-CoV-2 target nucleic acids are NOT DETECTED.  The SARS-CoV-2 RNA is generally detectable in upper respiratory specimens during the acute phase of infection. The lowest concentration of SARS-CoV-2 viral copies this assay can detect is 138 copies/mL. A  negative result does not preclude SARS-Cov-2 infection and should not be used as the sole basis for treatment or other patient management decisions. A negative result may occur with  improper specimen collection/handling, submission of specimen other than nasopharyngeal swab, presence of viral mutation(s) within the areas targeted by this assay, and inadequate number of viral copies(<138 copies/mL). A negative result must be combined with clinical observations, patient history, and epidemiological information. The expected result is Negative.  Fact Sheet for Patients:  EntrepreneurPulse.com.au  Fact Sheet for Healthcare Providers:  IncredibleEmployment.be  This test is no t yet approved or cleared by the [place] FDA and  has been authorized for detection and/or diagnosis of SARS-CoV-2 by FDA under an Emergency Use Authorization (EUA). This EUA will remain  in effect (meaning this test can be used) for the duration of the COVID-19 declaration under Section 564(b)(1) of the Act, 21 U.S.C.section 360bbb-3(b)(1), unless the authorization is terminated  or revoked sooner.       Influenza A by PCR NEGATIVE NEGATIVE Final  Influenza B by PCR NEGATIVE NEGATIVE Final    Comment: (NOTE) The Xpert Xpress SARS-CoV-2/FLU/RSV plus assay is intended as an aid in the diagnosis of influenza from Nasopharyngeal swab specimens and should not be used as a sole basis for treatment. Nasal washings and aspirates are unacceptable for Xpert Xpress SARS-CoV-2/FLU/RSV testing.  Fact Sheet for Patients: EntrepreneurPulse.com.au  Fact Sheet for Healthcare Providers: IncredibleEmployment.be  This test is not yet approved or cleared by the [place] FDA and has been authorized for detection and/or diagnosis of SARS-CoV-2 by FDA under an Emergency Use Authorization (EUA). This EUA will remain in effect (meaning this test can  be used) for the duration of the COVID-19 declaration under Section 564(b)(1) of the Act, 21 U.S.C. section 360bbb-3(b)(1), unless the authorization is terminated or revoked.  Performed at [facility], [address], [place], [place] [address]           Radiology Studies: DG C-Arm 1-60 Min-No Report  Result Date: 12/13/2021 CLINICAL DATA:  Intramedullary nail. EXAM: DG HIP (WITH OR WITHOUT PELVIS) 2-3V RIGHT; DG C-ARM 1-60 MIN-NO REPORT COMPARISON:  Fracture of the RIGHT hip. FINDINGS: Post intramedullary nail placement with dynamic hip screw. No unexpected immediate postprocedural complications on limited assessment. Rod extends to the distal femur. Distal interlocking screws are also in place. Soft tissue gas is noted about the surgical site. FLUOROSCOPY TIME:  2 minutes 6 seconds Fluoroscopy dose: 13.86 mGy IMPRESSION: Status post ORIF of the right hip. No unexpected postprocedural complications. Electronically Signed   By: Zetta Bills M.D.   On: 12/13/2021 15:39   DG HIP UNILAT WITH PELVIS 2-3 VIEWS RIGHT  Result Date: 12/13/2021 CLINICAL DATA:  Intramedullary nail. EXAM: DG HIP (WITH OR WITHOUT PELVIS) 2-3V RIGHT; DG C-ARM 1-60 MIN-NO REPORT COMPARISON:  Fracture of the RIGHT hip. FINDINGS: Post intramedullary nail placement with dynamic hip screw. No unexpected immediate postprocedural complications on limited assessment. Rod extends to the distal femur. Distal interlocking screws are also in place. Soft tissue gas is noted about the surgical site. FLUOROSCOPY TIME:  2 minutes 6 seconds Fluoroscopy dose: 13.86 mGy IMPRESSION: Status post ORIF of the right hip. No unexpected postprocedural complications. Electronically Signed   By: Zetta Bills M.D.   On: 12/13/2021 15:39   DG FEMUR, MIN 2 VIEWS RIGHT  Result Date: 12/13/2021 CLINICAL DATA:  Postop right femur EXAM: RIGHT FEMUR 2 VIEWS COMPARISON:  12/13/2021, 12/12/2021 FINDINGS: Interval intramedullary rodding of  comminuted right intertrochanteric fracture. Displaced lesser trochanteric fracture fragment. Hardware is intact. Gas in the soft tissues consistent with recent surgery IMPRESSION: Interval surgical fixation of comminuted right intertrochanteric fracture with expected postsurgical change Electronically Signed   By: Donavan Foil M.D.   On: 12/13/2021 17:19    Scheduled Meds:  Chlorhexidine Gluconate Cloth  6 each Topical Daily   cholecalciferol  2,000 Units Oral Daily   docusate sodium  100 mg Oral BID   enoxaparin (LOVENOX) injection  30 mg Subcutaneous Q24H   ipratropium  2 spray Nasal TID   rosuvastatin  40 mg Oral Daily   senna  1 tablet Oral BID   traMADol  50 mg Oral Q6H   Continuous Infusions:  sodium chloride 100 mL/hr at 12/15/21 0232   methocarbamol (ROBAXIN) IV       LOS: 3 days   Time spent: 68min  Sheriann Newmann C Khai Torbert, DO [facility]  If 7PM-7AM, please contact night-coverage [email or web address]  12/15/2021, 7:32 AM

## 2021-12-15 NOTE — Progress Notes (Signed)
°  Subjective:  POD #2 s/p intramedullary fixation for right intertrochanteric hip fracture.  Patient has dementia and is unable to provide a detailed history.    Objective:   VITALS:   Vitals:   12/15/21 1049 12/15/21 1055 12/15/21 1100 12/15/21 1207  BP: 91/63   102/72  Pulse:    72  Resp:    14  Temp:    (!) 97.5 F (36.4 C)  TempSrc:      SpO2: (!) 84% (!) 88% 92% 100%  Weight:      Height:        PHYSICAL EXAM: Right lower extremity Neurovascular intact Sensation intact distally Intact pulses distally Dorsiflexion/Plantar flexion intact Incision: dressing C/D/I No cellulitis present Compartment soft  LABS  No results found for this or any previous visit (from the past 24 hour(s)).   DG FEMUR, MIN 2 VIEWS RIGHT  Result Date: 12/13/2021 CLINICAL DATA:  Postop right femur EXAM: RIGHT FEMUR 2 VIEWS COMPARISON:  12/13/2021, 12/12/2021 FINDINGS: Interval intramedullary rodding of comminuted right intertrochanteric fracture. Displaced lesser trochanteric fracture fragment. Hardware is intact. Gas in the soft tissues consistent with recent surgery IMPRESSION: Interval surgical fixation of comminuted right intertrochanteric fracture with expected postsurgical change Electronically Signed   By: Donavan Foil M.D.   On: 12/13/2021 17:19    Assessment/Plan: 2 Days Post-Op   Principal Problem:   Closed right hip fracture ([place])  - Continue physical therapy and occupational therapy as tolerated.   - Continue Lovenox for DVT prophylaxis - Patient will need a skilled nursing facility upon discharge as she lives alone and has a history of dementia - Follow-up with Dr. Mack Guise in 2 weeks    Renee Harder , MD 12/15/2021, 3:42 PM

## 2021-12-16 DIAGNOSIS — S72001A Fracture of unspecified part of neck of right femur, initial encounter for closed fracture: Secondary | ICD-10-CM | POA: Diagnosis not present

## 2021-12-16 LAB — CBC
HCT: 27.8 % — ABNORMAL LOW (ref 36.0–46.0)
Hemoglobin: 9 g/dL — ABNORMAL LOW (ref 12.0–15.0)
MCH: 31.3 pg (ref 26.0–34.0)
MCHC: 32.4 g/dL (ref 30.0–36.0)
MCV: 96.5 fL (ref 80.0–100.0)
Platelets: 125 10*3/uL — ABNORMAL LOW (ref 150–400)
RBC: 2.88 MIL/uL — ABNORMAL LOW (ref 3.87–5.11)
RDW: 13.2 % (ref 11.5–15.5)
WBC: 6.2 10*3/uL (ref 4.0–10.5)
nRBC: 0 % (ref 0.0–0.2)

## 2021-12-16 LAB — BASIC METABOLIC PANEL
Anion gap: 7 (ref 5–15)
BUN: 30 mg/dL — ABNORMAL HIGH (ref 8–23)
CO2: 24 mmol/L (ref 22–32)
Calcium: 9.4 mg/dL (ref 8.9–10.3)
Chloride: 108 mmol/L (ref 98–111)
Creatinine, Ser: 0.78 mg/dL (ref 0.44–1.00)
GFR, Estimated: >60 mL/min (ref >60)
Glucose, Bld: 105 mg/dL — ABNORMAL HIGH (ref 70–99)
Potassium: 3.5 mmol/L (ref 3.5–5.1)
Sodium: 139 mmol/L (ref 135–145)

## 2021-12-16 LAB — CK: Total CK: 796 U/L — ABNORMAL HIGH (ref 38–234)

## 2021-12-16 NOTE — TOC Progression Note (Addendum)
Transition of Care [facility]) - Progression Note    Patient Details  Name: Morgan Stewart MRN: [id number] Date of Birth: [birth year]  [facility]) CM/SW [place], LCSW Phone Number: 12/16/2021, 9:01 AM  Clinical Narrative:   [name] is still pending. 30 day note has been uploaded.   10:14- Call to [place] to present bed offers at [facility] and [facility]. [name] stated she prefers somewhere closer and wants to see if any other SNFs respond tomorrow after the holiday weekend, but if she had to choose between the current 2 she would choose [facility].  CSW asked SNFs with pending bed requests to review ([facility], [facility], [facility], and [facility] -- no admissions to contact at [facility] on weekends). Will leave handoff for TOC to follow up tomorrow with update on bed offers.    Expected Discharge Plan: [place] Barriers to Discharge: Continued Medical Work up  Expected Discharge Plan and Services Expected Discharge Plan: [place]       Living arrangements for the past 2 months: Single Family Home                                       Social Determinants of Health (SDOH) Interventions    Readmission Risk Interventions Readmission Risk Prevention Plan 12/14/2021  Post Dischage Appt Complete  Medication Screening Complete  Transportation Screening Complete  Some recent data might be hidden

## 2021-12-16 NOTE — Progress Notes (Signed)
Physical Therapy Treatment Patient Details Name: Morgan Stewart MRN: [id number] DOB: [birth year] Today's Date: 12/16/2021   History of Present Illness Pt is a 76 y/o F admitted on 12/12/21 with c/c of an unwitnessed fall. Pt was found to have displaced R intertrochanteric hip fracture & underwent intramedullary fixation on 12/13/21 by Dr. Mack Guise. Pt also had subsequent acute rhabdomyolysis s/p fall. PMH: CAD, HTN, COPD, DJD, gout, dyslipidemia, vitamin D & B12 deficiencies, tobacco abuse    PT Comments    Pt in bed upon PT arrival with brief mostly removed (appearing to have been removed by pt) and pt attempting to remove B mitts.  Pt noted to be incontinent of urine so brief and pad changed and clean pad placed under pt (nurse and NT notified).  Pt able to logroll L and R in bed with extra time and cueing with min to mod assist x1; max assist semi-supine to sitting edge of bed; and 2 assist to lay back down in bed end of session.  Pt mostly leaning to L in sitting (appearing to be d/t R hip discomfort) x8 minutes; unable to safely attempt standing d/t pt's L lean in sitting and pt's difficulty following commands.  Pt in bed end of session with nurse and NT present attending to pt.  Will continue to progress pt with strengthening and progressive functional mobility per pt tolerance.   Recommendations for follow up therapy are one component of a multi-disciplinary discharge planning process, led by the attending physician.  Recommendations may be updated based on patient status, additional functional criteria and insurance authorization.  Follow Up Recommendations  Skilled nursing-short term rehab (<3 hours/day)     Assistance Recommended at Discharge Frequent or constant Supervision/Assistance  Equipment Recommendations  Other (comment) (TBD at next facility)    Recommendations for Other Services       Precautions / Restrictions Precautions Precautions: Fall Restrictions Weight  Bearing Restrictions: Yes RLE Weight Bearing: Weight bearing as tolerated     Mobility  Bed Mobility Overal bed mobility: Needs Assistance Bed Mobility: Rolling;Supine to Sit;Sit to Supine Rolling: Min assist;Mod assist (logrolling to L and R 2x's each)   Supine to sit: Max assist;HOB elevated Sit to supine: +2 for physical assistance   General bed mobility comments: assist for trunk and B LE's; vc's for technique    Transfers                   General transfer comment: Deferred d/t safety concerns (pt sitting leaning to L and difficulty following cues to attempt)    Ambulation/Gait               General Gait Details: not safe at this time   Stairs             Wheelchair Mobility    Modified Rankin (Stroke Patients Only)       Balance Overall balance assessment: Needs assistance Sitting-balance support: Bilateral upper extremity supported;Feet supported Sitting balance-Leahy Scale: Fair Sitting balance - Comments: pt able to sit with close SBA to CGA but only able to briefly sit in midline position (mostly leaning towards L to offload R hip d/t discomfort)                                    Cognition Arousal/Alertness: Awake/alert Behavior During Therapy: Impulsive Overall Cognitive Status: No family/caregiver present to determine baseline cognitive functioning  General Comments: Able to follow simple 1 step cues intermittently for short duration        Exercises      General Comments  Nursing cleared pt for participation in physical therapy.  Pt agreeable to PT session.      Pertinent Vitals/Pain Pain Assessment: Faces Faces Pain Scale: Hurts a little bit Pain Location: R hip Pain Descriptors / Indicators: Grimacing;Guarding Pain Intervention(s): Limited activity within patient's tolerance;Monitored during session;Repositioned    Home Living                           Prior Function            PT Goals (current goals can now be found in the care plan section) Acute Rehab PT Goals PT Goal Formulation: Patient unable to participate in goal setting Time For Goal Achievement: 12/28/21 Potential to Achieve Goals: Fair Progress towards PT goals: Progressing toward goals    Frequency    7X/week      PT Plan Current plan remains appropriate    Co-evaluation              AM-PAC PT "6 Clicks" Mobility   Outcome Measure  Help needed turning from your back to your side while in a flat bed without using bedrails?: A Lot Help needed moving from lying on your back to sitting on the side of a flat bed without using bedrails?: A Lot Help needed moving to and from a bed to a chair (including a wheelchair)?: Total Help needed standing up from a chair using your arms (e.g., wheelchair or bedside chair)?: Total Help needed to walk in hospital room?: Total Help needed climbing 3-5 steps with a railing? : Total 6 Click Score: 8    End of Session Equipment Utilized During Treatment: Oxygen Activity Tolerance: Patient tolerated treatment well Patient left: in bed;with call bell/phone within reach;with bed alarm set;with nursing/sitter in room;with SCD's reapplied Nurse Communication: Mobility status;Precautions;Other (comment) (pt incontinent of urine and would benefit from further clean-up) PT Visit Diagnosis: Unsteadiness on feet (R26.81);Muscle weakness (generalized) (M62.81);Difficulty in walking, not elsewhere classified (R26.2);Pain Pain - Right/Left: Right Pain - part of body: Hip     Time: 1014-1040 PT Time Calculation (min) (ACUTE ONLY): 26 min  Charges:  $Therapeutic Activity: 23-37 mins                    Leitha Bleak, PT 12/16/21, 10:55 AM

## 2021-12-16 NOTE — Progress Notes (Signed)
Attempted to call patient niece [name]) at [phone].  Phone went to voicemail, no message left due to no identification noted on answering machine.

## 2021-12-16 NOTE — Progress Notes (Signed)
PROGRESS NOTE    Morgan Stewart  IRC:[id number] DOB: Mar 22, 1946 DOA: 12/12/2021 PCP: Lequita Asal, MD (Inactive)   Brief Narrative:  Morgan Stewart is a 76 y.o. Caucasian female with medical history significant for Coronary artery disease, hypertension, COPD, DJD, gout, dyslipidemia, vitamin D deficiency, vitamin B12 deficiency and tobacco abuse, presented to the ER with a [place]isetti of right hip pain after having a fall last night.  It is unclear if she had a syncope or not.  She stated that she could not get up after falling -unwitnessed event as she lives independently at home.  In the ED imaging remarkable for right femur fracture, orthopedics consulted, hospitalist called for admission.  Assessment & Plan:  Closed right hip fracture secondary to presumed mechanical fall, POA Acute rhabdomyolysis, resolving Transient hypoxia secondary to narcotics, resolving -Orthopedics following, status post ORIF 12/29 -Pain currently well controlled -PT/OT ongoing - plan for dispo to SNF given fracture type and age.  Essential hypertension, with notable postoperative hypotension -DC all BP meds - Hypotension improving over the past 48h -Likely secondary to narcotics and poor PO intake  Dyslipidemia -continue statin Dementia -transient metabolic encephalopathy(POA) likely secondary to narcotics, back to baseline Depression continue Effexor Exar, trazodone  Coronary artery disease. W continue carvedilol, blood pressure control ; holding aspirin perioperatively.  DVT prophylaxis: SCDs. Code Status: full code. Family Communication:  The plan of care was discussed in details with the patient   Status is: Inpatient  Dispo: The patient is from: Home              Anticipated d/c is to: To be determined              Anticipated d/c date is: 48 to 72 hours              Patient currently not medically stable for discharge  Consultants:  Orthopedic surgery  Procedures:  ORIF right  femur 12/13/2021  Antimicrobials:  None indicated  Subjective: No acute issues or events overnight, tolerating p.o. with staff this morning, remains in mittens to her attempted removal of IV and oxygen overnight.  Objective: Vitals:   12/15/21 1207 12/15/21 1500 12/15/21 2024 12/16/21 0348  BP: 102/72 124/72 127/76 105/75  Pulse: 72 61 98 92  Resp: 14 15 15 14   Temp: (!) 97.5 F (36.4 C) (!) 97.4 F (36.3 C) 97.8 F (36.6 C) 98.2 F (36.8 C)  TempSrc:      SpO2: 100% 95%    Weight:      Height:       No intake or output data in the 24 hours ending 12/16/21 0751  Filed Weights   12/12/21 1420  Weight: 72 kg    Examination:  General exam: Appears calm and comfortable  Respiratory system: Clear to auscultation. Respiratory effort normal. Cardiovascular system: S1 & S2 heard, RRR. No JVD, murmurs, rubs, gallops or clicks. No pedal edema. Gastrointestinal system: Abdomen is nondistended, soft and nontender. No organomegaly or masses felt. Normal bowel sounds heard. Central nervous system: Alert and oriented. No focal neurological deficits. Extremities: Symmetric 5 x 5 power. Skin: No rashes, lesions or ulcers Psychiatry: Judgement and insight limited due to dementia  Data Reviewed: I have personally reviewed following labs and imaging studies  CBC: Recent Labs  Lab 12/12/21 1506 12/13/21 0332 12/14/21 0649 12/16/21 0614  WBC 10.2 9.5 6.5 6.2  NEUTROABS 8.3*  --   --   --   HGB 12.2 10.4* 8.7* 9.0*  HCT 36.6  32.1* 26.5* 27.8*  MCV 94.1 93.6 96.0 96.5  PLT 112* 98* 81* 125*    Basic Metabolic Panel: Recent Labs  Lab 12/12/21 1506 12/13/21 0332 12/14/21 0649 12/16/21 0614  NA 144 139 142 139  K 3.8 4.1 4.0 3.5  CL 107 107 110 108  CO2 25 26 24 24   GLUCOSE 102* 108* 103* 105*  BUN 37* 35* 34* 30*  CREATININE 1.16* 1.12* 0.96 0.78  CALCIUM 10.0 9.3 8.8* 9.4    GFR: Estimated Creatinine Clearance: 61.8 mL/min (by C-G formula based on SCr of 0.78  mg/dL). Liver Function Tests: Recent Labs  Lab 12/12/21 1506  AST 82*  ALT 33  ALKPHOS 69  BILITOT 1.7*  PROT 6.6  ALBUMIN 4.1    No results for input(s): LIPASE, AMYLASE in the last 168 hours. No results for input(s): AMMONIA in the last 168 hours. Coagulation Profile: No results for input(s): INR, PROTIME in the last 168 hours. Cardiac Enzymes: Recent Labs  Lab 12/12/21 1506 12/13/21 0332 12/13/21 1801 12/16/21 0614  CKTOTAL 2,439* 1,582* 980* 796*    BNP (last 3 results) No results for input(s): PROBNP in the last 8760 hours. HbA1C: No results for input(s): HGBA1C in the last 72 hours. CBG: No results for input(s): GLUCAP in the last 168 hours. Lipid Profile: No results for input(s): CHOL, HDL, LDLCALC, TRIG, CHOLHDL, LDLDIRECT in the last 72 hours. Thyroid Function Tests: No results for input(s): TSH, T4TOTAL, FREET4, T3FREE, THYROIDAB in the last 72 hours. Anemia Panel: No results for input(s): VITAMINB12, FOLATE, FERRITIN, TIBC, IRON, RETICCTPCT in the last 72 hours. Sepsis Labs: No results for input(s): PROCALCITON, LATICACIDVEN in the last 168 hours.  Recent Results (from the past 240 hour(s))  Resp Panel by RT-PCR (Flu A&B, Covid) Nasopharyngeal Swab     Status: None   Collection Time: 12/12/21  3:06 PM   Specimen: Nasopharyngeal Swab; Nasopharyngeal(NP) swabs in vial transport medium  Result Value Ref Range Status   SARS Coronavirus 2 by RT PCR NEGATIVE NEGATIVE Final    Comment: (NOTE) SARS-CoV-2 target nucleic acids are NOT DETECTED.  The SARS-CoV-2 RNA is generally detectable in upper respiratory specimens during the acute phase of infection. The lowest concentration of SARS-CoV-2 viral copies this assay can detect is 138 copies/mL. A negative result does not preclude SARS-Cov-2 infection and should not be used as the sole basis for treatment or other patient management decisions. A negative result may occur with  improper specimen  collection/handling, submission of specimen other than nasopharyngeal swab, presence of viral mutation(s) within the areas targeted by this assay, and inadequate number of viral copies(<138 copies/mL). A negative result must be combined with clinical observations, patient history, and epidemiological information. The expected result is Negative.  Fact Sheet for Patients:  EntrepreneurPulse.com.au  Fact Sheet for Healthcare Providers:  IncredibleEmployment.be  This test is no t yet approved or cleared by the [place] FDA and  has been authorized for detection and/or diagnosis of SARS-CoV-2 by FDA under an Emergency Use Authorization (EUA). This EUA will remain  in effect (meaning this test can be used) for the duration of the COVID-19 declaration under Section 564(b)(1) of the Act, 21 U.S.C.section 360bbb-3(b)(1), unless the authorization is terminated  or revoked sooner.       Influenza A by PCR NEGATIVE NEGATIVE Final   Influenza B by PCR NEGATIVE NEGATIVE Final    Comment: (NOTE) The Xpert Xpress SARS-CoV-2/FLU/RSV plus assay is intended as an aid in the diagnosis of influenza from  Nasopharyngeal swab specimens and should not be used as a sole basis for treatment. Nasal washings and aspirates are unacceptable for Xpert Xpress SARS-CoV-2/FLU/RSV testing.  Fact Sheet for Patients: EntrepreneurPulse.com.au  Fact Sheet for Healthcare Providers: IncredibleEmployment.be  This test is not yet approved or cleared by the [place] FDA and has been authorized for detection and/or diagnosis of SARS-CoV-2 by FDA under an Emergency Use Authorization (EUA). This EUA will remain in effect (meaning this test can be used) for the duration of the COVID-19 declaration under Section 564(b)(1) of the Act, 21 U.S.C. section 360bbb-3(b)(1), unless the authorization is terminated or revoked.  Performed at [facility], [address], [place], [place] [address]    Radiology Studies: No results found.  Scheduled Meds:  Chlorhexidine Gluconate Cloth  6 each Topical Daily   cholecalciferol  2,000 Units Oral Daily   docusate sodium  100 mg Oral BID   enoxaparin (LOVENOX) injection  30 mg Subcutaneous Q24H   ipratropium  2 spray Nasal TID   rosuvastatin  40 mg Oral Daily   senna  1 tablet Oral BID   traMADol  50 mg Oral Q6H   Continuous Infusions:  sodium chloride Stopped (12/15/21 1543)   methocarbamol (ROBAXIN) IV       LOS: 4 days   Time spent: 53min  Morgan Brase C Trenesha Alcaide, DO [facility]  If 7PM-7AM, please contact night-coverage [email or web address]  12/16/2021, 7:51 AM

## 2021-12-17 DIAGNOSIS — S72001A Fracture of unspecified part of neck of right femur, initial encounter for closed fracture: Secondary | ICD-10-CM | POA: Diagnosis not present

## 2021-12-17 NOTE — Progress Notes (Signed)
Physical Therapy Treatment Patient Details Name: Morgan Stewart MRN: [id number] DOB: [birth year] Today's Date: 12/17/2021   History of Present Illness Pt is a 76 y/o F admitted on 12/12/21 with c/c of an unwitnessed fall. Pt was found to have displaced R intertrochanteric hip fracture & underwent intramedullary fixation on 12/13/21 by Dr. Mack Guise. Pt also had subsequent acute rhabdomyolysis s/p fall. PMH: CAD, HTN, COPD, DJD, gout, dyslipidemia, vitamin D & B12 deficiencies, tobacco abuse    PT Comments    Pt resting in bed upon PT arrival; agreeable to PT session.  Oriented to self only; follows 1 step cues intermittently for brief periods of time.  Min to mod to max assist with bed mobility; mod assist to stand up to RW (x2 trials); and min to mod assist to take a couple steps in place with B LE's and RW use (limited d/t R hip/thigh pain with activity).  Pt assisted back to bed and repositioned for comfort end of session.  Will continue to focus on strengthening, balance, and progressive functional mobility per pt tolerance.    Recommendations for follow up therapy are one component of a multi-disciplinary discharge planning process, led by the attending physician.  Recommendations may be updated based on patient status, additional functional criteria and insurance authorization.  Follow Up Recommendations  Skilled nursing-short term rehab (<3 hours/day)     Assistance Recommended at Discharge Frequent or constant Supervision/Assistance  Equipment Recommendations  Other (comment) (TBD at next facility)    Recommendations for Other Services       Precautions / Restrictions Precautions Precautions: Fall Restrictions Weight Bearing Restrictions: Yes RLE Weight Bearing: Weight bearing as tolerated     Mobility  Bed Mobility Overal bed mobility: Needs Assistance Bed Mobility: Supine to Sit;Sit to Supine     Supine to sit: Min assist;Mod assist Sit to supine: Mod assist;Max  assist   General bed mobility comments: assist for trunk and B LE's; vc's for technique    Transfers Overall transfer level: Needs assistance Equipment used: Rolling walker (2 wheels) Transfers: Sit to/from Stand Sit to Stand: Mod assist           General transfer comment: vc's and tactile cues for UE/LE placement and overall technique; assist to initiate stand up to RW and control descent sitting    Ambulation/Gait Ambulation/Gait assistance: Min assist;Mod assist Gait Distance (Feet):  (pt able to take a couple of small steps in place B LE's) Assistive device: Rolling walker (2 wheels)   Gait velocity: decreased     General Gait Details: antalgic; limited d/t R hip/thigh pain   Stairs             Wheelchair Mobility    Modified Rankin (Stroke Patients Only)       Balance Overall balance assessment: Needs assistance Sitting-balance support: Single extremity supported;Feet supported Sitting balance-Leahy Scale: Fair Sitting balance - Comments: steady static sitting but tending to lean towards L to offload R hip (d/t R hip discomfort) Postural control: Left lateral lean Standing balance support: During functional activity;Bilateral upper extremity supported   Standing balance comment: vc's and tactile cues for upright posture required                            Cognition Arousal/Alertness: Awake/alert Behavior During Therapy: Impulsive Overall Cognitive Status: No family/caregiver present to determine baseline cognitive functioning  General Comments: Able to follow simple 1 step cues intermittently for short duration; oriented to self only        Exercises Total Joint Exercises Heel Slides: AAROM;Strengthening;Right;10 reps;Supine    General Comments        Pertinent Vitals/Pain Pain Assessment: Faces Faces Pain Scale: Hurts a little bit (6/10 with activity; 2/10 at rest) Pain Location:  R hip Pain Descriptors / Indicators: Grimacing;Guarding Pain Intervention(s): Limited activity within patient's tolerance;Monitored during session;Repositioned Vitals (HR and O2 on room air) stable and WFL throughout treatment session.    Home Living                          Prior Function            PT Goals (current goals can now be found in the care plan section) Acute Rehab PT Goals PT Goal Formulation: Patient unable to participate in goal setting Time For Goal Achievement: 12/28/21 Potential to Achieve Goals: Fair Progress towards PT goals: Progressing toward goals    Frequency    7X/week      PT Plan Current plan remains appropriate    Co-evaluation              AM-PAC PT "6 Clicks" Mobility   Outcome Measure  Help needed turning from your back to your side while in a flat bed without using bedrails?: A Lot Help needed moving from lying on your back to sitting on the side of a flat bed without using bedrails?: A Lot Help needed moving to and from a bed to a chair (including a wheelchair)?: A Lot Help needed standing up from a chair using your arms (e.g., wheelchair or bedside chair)?: A Lot Help needed to walk in hospital room?: Total Help needed climbing 3-5 steps with a railing? : Total 6 Click Score: 10    End of Session Equipment Utilized During Treatment: Gait belt Activity Tolerance: Patient tolerated treatment well Patient left: in bed;with call bell/phone within reach;with bed alarm set;Other (comment) (R LE elevated via pillow with heel floating) Nurse Communication: Mobility status;Precautions PT Visit Diagnosis: Unsteadiness on feet (R26.81);Muscle weakness (generalized) (M62.81);Difficulty in walking, not elsewhere classified (R26.2);Pain Pain - Right/Left: Right Pain - part of body: Hip     Time: 1478-2956 PT Time Calculation (min) (ACUTE ONLY): 27 min  Charges:  $Therapeutic Activity: 23-37 mins                     Leitha Bleak, PT 12/17/21, 9:37 AM

## 2021-12-17 NOTE — Progress Notes (Signed)
PROGRESS NOTE    Morgan Stewart  SHF:[id number] DOB: [birth year] DOA: 12/12/2021 PCP: Lequita Asal, MD (Inactive)   Brief Narrative:  Morgan Stewart is a 76 y.o. Caucasian female with medical history significant for Coronary artery disease, hypertension, COPD, DJD, gout, dyslipidemia, vitamin D deficiency, vitamin B12 deficiency and tobacco abuse, presented to the ER with a [place] of right hip pain after having a fall last night.  It is unclear if she had a syncope or not.  She stated that she could not get up after falling -unwitnessed event as she lives independently at home.  In the ED imaging remarkable for right femur fracture, orthopedics consulted, hospitalist called for admission.  Patient tolerated right hip fracture repair on 12/13/2021, otherwise remains somewhat hypotensive in the setting of poor p.o. intake but improving over the past few days.  She remains medically stable for discharge, awaiting family choice and insurance approval for SNF discharge and placement.  Assessment & Plan:  Closed right hip fracture secondary to presumed mechanical fall, POA Acute rhabdomyolysis, resolving Transient hypoxia secondary to narcotics, resolving -Orthopedics following, status post ORIF 12/29 -Pain currently well controlled -PT/OT ongoing - plan for dispo to SNF given recommendations  Essential hypertension, with notable postoperative hypotension -DC all BP meds - Hypotension resolved over the past 48h -Likely exacerbated by narcotics and poor PO intake  Dyslipidemia -continue statin Dementia -transient metabolic encephalopathy(POA) likely secondary to narcotics, back to baseline per family Depression continue Effexor, trazodone  Coronary artery disease.  Holding carvedilol and aspirin given hypotension and postoperative anticoagulation ongoing.  DVT prophylaxis: Lovenox. Code Status: full code. Family Communication: Updated over the phone  Status is:  Inpatient  Dispo: The patient is from: Home              Anticipated d/c is to: SNF              Anticipated d/c date is: 24-48 hours              Patient currently not medically stable for discharge  Consultants:  Orthopedic surgery  Procedures:  ORIF right femur 12/13/2021  Antimicrobials:  None indicated  Subjective: no acute issues or events overnight, much more awake alert this morning appears to be at baseline denies nausea vomiting diarrhea constipation headache fevers chills or chest pain  Objective: Vitals:   12/16/21 0814 12/16/21 1530 12/16/21 2003 12/17/21 0426  BP: (!) 143/102 120/83 (!) 140/96 (!) 148/113  Pulse: 93 98 (!) 107 84  Resp: 18 18 20 20   Temp: 97.8 F (36.6 C) 97.9 F (36.6 C) 98 F (36.7 C) 97.9 F (36.6 C)  TempSrc:      SpO2:   99% 100%  Weight:      Height:       No intake or output data in the 24 hours ending 12/17/21 0730  Filed Weights   12/12/21 1420  Weight: 72 kg    Examination:  General exam: Appears calm and comfortable  Respiratory system: Clear to auscultation. Respiratory effort normal. Cardiovascular system: S1 & S2 heard, RRR. No JVD, murmurs, rubs, gallops or clicks. No pedal edema. Gastrointestinal system: Abdomen is nondistended, soft and nontender. No organomegaly or masses felt. Normal bowel sounds heard. Central nervous system: Alert and oriented. No focal neurological deficits. Extremities: Symmetric 5 x 5 power. Skin: No rashes, lesions or ulcers Psychiatry: Judgement and insight limited due to dementia  Data Reviewed: I have personally reviewed following labs and imaging studies  CBC: Recent Labs  Lab 12/12/21 1506 12/13/21 0332 12/14/21 0649 12/16/21 0614  WBC 10.2 9.5 6.5 6.2  NEUTROABS 8.3*  --   --   --   HGB 12.2 10.4* 8.7* 9.0*  HCT 36.6 32.1* 26.5* 27.8*  MCV 94.1 93.6 96.0 96.5  PLT 112* 98* 81* 125*    Basic Metabolic Panel: Recent Labs  Lab 12/12/21 1506 12/13/21 0332 12/14/21 0649  12/16/21 0614  NA 144 139 142 139  K 3.8 4.1 4.0 3.5  CL 107 107 110 108  CO2 25 26 24 24   GLUCOSE 102* 108* 103* 105*  BUN 37* 35* 34* 30*  CREATININE 1.16* 1.12* 0.96 0.78  CALCIUM 10.0 9.3 8.8* 9.4    GFR: Estimated Creatinine Clearance: 61.8 mL/min (by C-G formula based on SCr of 0.78 mg/dL). Liver Function Tests: Recent Labs  Lab 12/12/21 1506  AST 82*  ALT 33  ALKPHOS 69  BILITOT 1.7*  PROT 6.6  ALBUMIN 4.1    No results for input(s): LIPASE, AMYLASE in the last 168 hours. No results for input(s): AMMONIA in the last 168 hours. Coagulation Profile: No results for input(s): INR, PROTIME in the last 168 hours. Cardiac Enzymes: Recent Labs  Lab 12/12/21 1506 12/13/21 0332 12/13/21 1801 12/16/21 0614  CKTOTAL 2,439* 1,582* 980* 796*    BNP (last 3 results) No results for input(s): PROBNP in the last 8760 hours. HbA1C: No results for input(s): HGBA1C in the last 72 hours. CBG: No results for input(s): GLUCAP in the last 168 hours. Lipid Profile: No results for input(s): CHOL, HDL, LDLCALC, TRIG, CHOLHDL, LDLDIRECT in the last 72 hours. Thyroid Function Tests: No results for input(s): TSH, T4TOTAL, FREET4, T3FREE, THYROIDAB in the last 72 hours. Anemia Panel: No results for input(s): VITAMINB12, FOLATE, FERRITIN, TIBC, IRON, RETICCTPCT in the last 72 hours. Sepsis Labs: No results for input(s): PROCALCITON, LATICACIDVEN in the last 168 hours.  Recent Results (from the past 240 hour(s))  Resp Panel by RT-PCR (Flu A&B, Covid) Nasopharyngeal Swab     Status: None   Collection Time: 12/12/21  3:06 PM   Specimen: Nasopharyngeal Swab; Nasopharyngeal(NP) swabs in vial transport medium  Result Value Ref Range Status   SARS Coronavirus 2 by RT PCR NEGATIVE NEGATIVE Final    Comment: (NOTE) SARS-CoV-2 target nucleic acids are NOT DETECTED.  The SARS-CoV-2 RNA is generally detectable in upper respiratory specimens during the acute phase of infection. The  lowest concentration of SARS-CoV-2 viral copies this assay can detect is 138 copies/mL. A negative result does not preclude SARS-Cov-2 infection and should not be used as the sole basis for treatment or other patient management decisions. A negative result may occur with  improper specimen collection/handling, submission of specimen other than nasopharyngeal swab, presence of viral mutation(s) within the areas targeted by this assay, and inadequate number of viral copies(<138 copies/mL). A negative result must be combined with clinical observations, patient history, and epidemiological information. The expected result is Negative.  Fact Sheet for Patients:  EntrepreneurPulse.com.au  Fact Sheet for Healthcare Providers:  IncredibleEmployment.be  This test is no t yet approved or cleared by the [place] FDA and  has been authorized for detection and/or diagnosis of SARS-CoV-2 by FDA under an Emergency Use Authorization (EUA). This EUA will remain  in effect (meaning this test can be used) for the duration of the COVID-19 declaration under Section 564(b)(1) of the Act, 21 U.S.C.section 360bbb-3(b)(1), unless the authorization is terminated  or revoked sooner.       Influenza A  by PCR NEGATIVE NEGATIVE Final   Influenza B by PCR NEGATIVE NEGATIVE Final    Comment: (NOTE) The Xpert Xpress SARS-CoV-2/FLU/RSV plus assay is intended as an aid in the diagnosis of influenza from Nasopharyngeal swab specimens and should not be used as a sole basis for treatment. Nasal washings and aspirates are unacceptable for Xpert Xpress SARS-CoV-2/FLU/RSV testing.  Fact Sheet for Patients: EntrepreneurPulse.com.au  Fact Sheet for Healthcare Providers: IncredibleEmployment.be  This test is not yet approved or cleared by the [place] FDA and has been authorized for detection and/or diagnosis of SARS-CoV-2 by FDA under  an Emergency Use Authorization (EUA). This EUA will remain in effect (meaning this test can be used) for the duration of the COVID-19 declaration under Section 564(b)(1) of the Act, 21 U.S.C. section 360bbb-3(b)(1), unless the authorization is terminated or revoked.  Performed at [facility], [address] [place], [place] [address]    Radiology Studies: No results found.  Scheduled Meds:  Chlorhexidine Gluconate Cloth  6 each Topical Daily   cholecalciferol  2,000 Units Oral Daily   docusate sodium  100 mg Oral BID   enoxaparin (LOVENOX) injection  30 mg Subcutaneous Q24H   ipratropium  2 spray Nasal TID   rosuvastatin  40 mg Oral Daily   senna  1 tablet Oral BID   traMADol  50 mg Oral Q6H   Continuous Infusions:  sodium chloride Stopped (12/15/21 1543)   methocarbamol (ROBAXIN) IV       LOS: 5 days   Time spent: 7min  Kier Smead C Truly Stankiewicz, DO [facility]  If 7PM-7AM, please contact night-coverage [email or web address]  12/17/2021, 7:30 AM

## 2021-12-17 NOTE — Progress Notes (Signed)
Occupational Therapy Treatment Patient Details Name: Morgan Stewart MRN: [id number] DOB: 1946/08/20 Today's Date: 12/17/2021   History of present illness Pt is a 76 y/o F admitted on 12/12/21 with c/c of an unwitnessed fall. Pt was found to have displaced R intertrochanteric hip fracture & underwent intramedullary fixation on 12/13/21 by Dr. Mack Guise. Pt also had subsequent acute rhabdomyolysis s/p fall. PMH: CAD, HTN, COPD, DJD, gout, dyslipidemia, vitamin D & B12 deficiencies, tobacco abuse   OT comments   Upon entering the room, pt supine in bed and NT present assisting pt with lunch tray. Pt reporting not wanting any food. OT did give pt one bite and pt almost spit food back out. She did grab onto drink and take several sips but then declined further food from meal tray. Pt performing supine >sit with mod A for trunk and R LE. Pt very impulsive with movement this session. She pulled sacral patch off once seated on EOB and RN notified. OT attempted to assist pt with combing hair with min A but pt continues to shift further towards EOB in an attempt to stand. Pt standing with mod A from EOB with use of RW. Pt taking 2 side steps to the R with therapist pulling RW in that direction for pt to take step. Pt then not following any commands to sit and needed assistance for safety to return to bed with max A.    Recommendations for follow up therapy are one component of a multi-disciplinary discharge planning process, led by the attending physician.  Recommendations may be updated based on patient status, additional functional criteria and insurance authorization.    Follow Up Recommendations  Skilled nursing-short term rehab (<3 hours/day)    Assistance Recommended at Discharge Frequent or constant Supervision/Assistance  Equipment Recommendations  Other (comment) (defer to next venue of care)       Precautions / Restrictions Precautions Precautions: Fall Restrictions Weight Bearing  Restrictions: Yes RLE Weight Bearing: Weight bearing as tolerated       Mobility Bed Mobility Overal bed mobility: Needs Assistance Bed Mobility: Supine to Sit;Sit to Supine     Supine to sit: Mod assist Sit to supine: Max assist   General bed mobility comments: assist for trunk and B LE's; vc's for technique    Transfers Overall transfer level: Needs assistance Equipment used: Rolling walker (2 wheels) Transfers: Sit to/from Stand Sit to Stand: Mod assist                 Balance Overall balance assessment: Needs assistance Sitting-balance support: Single extremity supported;Feet supported Sitting balance-Leahy Scale: Fair   Postural control: Left lateral lean Standing balance support: During functional activity;Bilateral upper extremity supported Standing balance-Leahy Scale: Poor                             ADL either performed or assessed with clinical judgement   ADL Overall ADL's : Needs assistance/impaired     Grooming: Brushing hair;Sitting;Minimal assistance                                      Extremity/Trunk Assessment Upper Extremity Assessment Upper Extremity Assessment: Generalized weakness   Lower Extremity Assessment Lower Extremity Assessment: Difficult to assess due to impaired cognition   Cervical / Trunk Assessment Cervical / Trunk Assessment: Kyphotic    Vision Patient Visual Report: No change from  baseline            Cognition Arousal/Alertness: Awake/alert Behavior During Therapy: Impulsive Overall Cognitive Status: No family/caregiver present to determine baseline cognitive functioning                                 General Comments: Able to follow simple 1 step cues intermittently for short duration; oriented to self only                     Pertinent Vitals/ Pain       Pain Assessment: Faces Faces Pain Scale: Hurts a little bit Pain Location: R hip Pain Descriptors /  Indicators: Grimacing;Guarding Pain Intervention(s): Limited activity within patient's tolerance;Repositioned         Frequency  Min 2X/week        Progress Toward Goals  OT Goals(current goals can now be found in the care plan section)  Progress towards OT goals: Progressing toward goals  Acute Rehab OT Goals Patient Stated Goal: to go to rehab OT Goal Formulation: With patient Time For Goal Achievement: 12/28/21 Potential to Achieve Goals: [place] Discharge plan remains appropriate;Frequency remains appropriate       AM-PAC OT "6 Clicks" Daily Activity     Outcome Measure   Help from another person eating meals?: A Little Help from another person taking care of personal grooming?: A Little Help from another person toileting, which includes using toliet, bedpan, or urinal?: Total Help from another person bathing (including washing, rinsing, drying)?: A Lot Help from another person to put on and taking off regular upper body clothing?: A Lot Help from another person to put on and taking off regular lower body clothing?: Total 6 Click Score: 12    End of Session Equipment Utilized During Treatment: Rolling walker (2 wheels)  OT Visit Diagnosis: Unsteadiness on feet (R26.81);Repeated falls (R29.6);Muscle weakness (generalized) (M62.81);History of falling (Z91.81)   Activity Tolerance Patient limited by pain   Patient Left in bed;with call bell/phone within reach;with bed alarm set   Nurse Communication Mobility status        Time: 7425-9563 OT Time Calculation (min): 23 min  Charges: OT General Charges $OT Visit: 1 Visit OT Treatments $Self Care/Home Management : 8-22 mins $Therapeutic Activity: 8-22 mins  Darleen Crocker, MS, OTR/L , CBIS ascom [phone]  12/17/21, 4:03 PM

## 2021-12-17 NOTE — TOC Progression Note (Addendum)
Transition of Care [facility]) - Progression Note    Patient Details  Name: Morgan Stewart MRN: [id number] Date of Birth: 1946/11/01  Transition of Care [facility] CM/SW [place], RN Phone Number: 12/17/2021, 10:38 AM  Clinical Narrative:   PASSR still pending, left a vm for  [name] the patients niece and POA requested a call back She stated that she would like to accept the bed offer fro [facility] Eng notified Miquel Dunn, requested that they get the ins auth as [facility] is primary    Expected Discharge Plan: [place] Barriers to Discharge: Continued Medical Work up  Expected Discharge Plan and Services Expected Discharge Plan: [place] arrangements for the past 2 months: Single Family Home                                       Social Determinants of Health (SDOH) Interventions    Readmission Risk Interventions Readmission Risk Prevention Plan 12/14/2021  Post Dischage Appt Complete  Medication Screening Complete  Transportation Screening Complete  Some recent data might be hidden

## 2021-12-17 NOTE — Plan of Care (Signed)
°  Problem: Education: Goal: Knowledge of General Education information will improve Description: Including pain rating scale, medication(s)/side effects and non-pharmacologic comfort measures Outcome: Progressing   Problem: Health Behavior/Discharge Planning: Goal: Ability to manage health-related needs will improve Outcome: Progressing   Problem: Clinical Measurements: Goal: Ability to maintain clinical measurements within normal limits will improve 12/17/2021 0102 by Jule Ser, RN Outcome: Progressing 12/17/2021 0101 by Jule Ser, RN Outcome: Progressing Goal: Will remain free from infection 12/17/2021 0102 by Jule Ser, RN Outcome: Progressing 12/17/2021 0101 by Jule Ser, RN Outcome: Progressing Goal: Diagnostic test results will improve Outcome: Progressing Goal: Respiratory complications will improve 12/17/2021 0102 by Jule Ser, RN Outcome: Progressing 12/17/2021 0101 by Jule Ser, RN Outcome: Progressing Goal: Cardiovascular complication will be avoided Outcome: Progressing   Problem: Activity: Goal: Risk for activity intolerance will decrease Outcome: Progressing   Problem: Nutrition: Goal: Adequate nutrition will be maintained 12/17/2021 0102 by Jule Ser, RN Outcome: Progressing 12/17/2021 0101 by Jule Ser, RN Outcome: Progressing   Problem: Coping: Goal: Level of anxiety will decrease Outcome: Progressing   Problem: Elimination: Goal: Will not experience complications related to bowel motility Outcome: Progressing Goal: Will not experience complications related to urinary retention Outcome: Progressing   Problem: Pain Managment: Goal: General experience of comfort will improve 12/17/2021 0102 by Jule Ser, RN Outcome: Progressing 12/17/2021 0101 by Jule Ser, RN Outcome: Progressing   Problem: Safety: Goal: Ability to remain free from injury will improve Outcome: Progressing   Problem: Skin  Integrity: Goal: Risk for impaired skin integrity will decrease Outcome: Progressing

## 2021-12-18 ENCOUNTER — Inpatient Hospital Stay: Payer: Medicare HMO

## 2021-12-18 DIAGNOSIS — S72001A Fracture of unspecified part of neck of right femur, initial encounter for closed fracture: Secondary | ICD-10-CM | POA: Diagnosis not present

## 2021-12-18 LAB — CBC
HCT: 26 % — ABNORMAL LOW (ref 36.0–46.0)
Hemoglobin: 8.6 g/dL — ABNORMAL LOW (ref 12.0–15.0)
MCH: 31 pg (ref 26.0–34.0)
MCHC: 33.1 g/dL (ref 30.0–36.0)
MCV: 93.9 fL (ref 80.0–100.0)
Platelets: 131 10*3/uL — ABNORMAL LOW (ref 150–400)
RBC: 2.77 MIL/uL — ABNORMAL LOW (ref 3.87–5.11)
RDW: 13.7 % (ref 11.5–15.5)
WBC: 4.3 10*3/uL (ref 4.0–10.5)
nRBC: 0 % (ref 0.0–0.2)

## 2021-12-18 LAB — BASIC METABOLIC PANEL
Anion gap: 5 (ref 5–15)
BUN: 19 mg/dL (ref 8–23)
CO2: 27 mmol/L (ref 22–32)
Calcium: 8.6 mg/dL — ABNORMAL LOW (ref 8.9–10.3)
Chloride: 108 mmol/L (ref 98–111)
Creatinine, Ser: 0.66 mg/dL (ref 0.44–1.00)
GFR, Estimated: >60 mL/min (ref >60)
Glucose, Bld: 90 mg/dL (ref 70–99)
Potassium: 3.3 mmol/L — ABNORMAL LOW (ref 3.5–5.1)
Sodium: 140 mmol/L (ref 135–145)

## 2021-12-18 LAB — RESP PANEL BY RT-PCR (FLU A&B, COVID) ARPGX2
Influenza A by PCR: NEGATIVE
Influenza B by PCR: NEGATIVE
SARS Coronavirus 2 by RT PCR: POSITIVE — AB

## 2021-12-18 LAB — CK: Total CK: 344 U/L — ABNORMAL HIGH (ref 38–234)

## 2021-12-18 MED ORDER — FLEET ENEMA 7-19 GM/118ML RE ENEM
1.0000 | ENEMA | Freq: Once | RECTAL | Status: AC
  Administered 2021-12-18: 1 via RECTAL

## 2021-12-18 MED ORDER — POLYETHYLENE GLYCOL 3350 17 G PO PACK: 17.0000 g | PACK | Freq: Every day | ORAL | 0 refills | Status: AC | PRN

## 2021-12-18 MED ORDER — SENNA 8.6 MG PO TABS: 1.0000 | ORAL_TABLET | Freq: Two times a day (BID) | ORAL | 0 refills | Status: AC

## 2021-12-18 MED ORDER — METHOCARBAMOL 500 MG PO TABS: 500.0000 mg | ORAL_TABLET | Freq: Four times a day (QID) | ORAL | 0 refills | Status: AC | PRN

## 2021-12-18 MED ORDER — BISACODYL 10 MG RE SUPP: 10.0000 mg | Freq: Every day | RECTAL | 0 refills | Status: AC | PRN

## 2021-12-18 MED ORDER — DOCUSATE SODIUM 100 MG PO CAPS: 100.0000 mg | ORAL_CAPSULE | Freq: Two times a day (BID) | ORAL | 0 refills | Status: AC

## 2021-12-18 MED ORDER — ACETAMINOPHEN 325 MG PO TABS: 650.0000 mg | ORAL_TABLET | Freq: Four times a day (QID) | ORAL | 0 refills | Status: DC | PRN

## 2021-12-18 MED ORDER — PEG 3350-KCL-NA BICARB-NACL 420 G PO SOLR
4000.0000 mL | Freq: Once | ORAL | Status: DC
  Filled 2021-12-18: qty 4000

## 2021-12-18 MED ORDER — IPRATROPIUM BROMIDE 0.06 % NA SOLN: 2.0000 | Freq: Three times a day (TID) | NASAL | 0 refills | Status: AC

## 2021-12-18 MED ORDER — HYDROCODONE-ACETAMINOPHEN 5-325 MG PO TABS
1.0000 | ORAL_TABLET | ORAL | 0 refills | Status: DC | PRN
Start: 2021-12-18 — End: 2021-12-31

## 2021-12-18 NOTE — Progress Notes (Signed)
Physical Therapy Treatment Patient Details Name: Morgan Stewart MRN: [id number] DOB: September 24, 1946 Today's Date: 12/18/2021   History of Present Illness Pt is a 76 y/o F admitted on 12/12/21 with c/c of an unwitnessed fall. Pt was found to have displaced R intertrochanteric hip fracture & underwent intramedullary fixation on 12/13/21 by Dr. Mack Guise. Pt also had subsequent acute rhabdomyolysis s/p fall. PMH: CAD, HTN, COPD, DJD, gout, dyslipidemia, vitamin D & B12 deficiencies, tobacco abuse    PT Comments    Pt resting in bed upon PT arrival and agreeable to PT session.  Min to mod assist semi-supine to sitting edge of bed; mod assist to stand up to RW from bed (x2 trials); attempted to have pt take steps to [facility] with RW (d/t pt reporting needing to urinate) but pt incontinent of urine in standing so pt assisted to sitting back on bed for safety; max assist stand pivot bed to [facility] for toileting; max assist (plus 2nd assist for safety d/t pt's impulsivity and difficulty following cues) stand pivot BSC back to bed; and max assist sit to semi-supine in bed.  Pt's bed linens and gown and socks changed and NT came to assist with pt clean-up.  O2 sats 86-87% on room air at rest beginning of session so therapist placed pt on 2 L O2 via nasal cannula and O2 sats 93% on 2 L O2 end of therapy session at rest (nurse notified).  Will continue to focus on strengthening and progressive functional mobility per pt tolerance.   Recommendations for follow up therapy are one component of a multi-disciplinary discharge planning process, led by the attending physician.  Recommendations may be updated based on patient status, additional functional criteria and insurance authorization.  Follow Up Recommendations  Skilled nursing-short term rehab (<3 hours/day)     Assistance Recommended at Discharge Frequent or constant Supervision/Assistance  Patient can return home with the following Two people to help with walking  and/or transfers;Assistance with cooking/housework;Direct supervision/assist for medications management;Assist for transportation;Help with stairs or ramp for entrance;Direct supervision/assist for financial management;Assistance with feeding;Two people to help with bathing/dressing/bathroom   Equipment Recommendations  Hospital bed;Wheelchair (measurements PT);Wheelchair cushion (measurements PT);Other (comment);BSC/3in1 (hoyer lift)    Recommendations for Other Services       Precautions / Restrictions Precautions Precautions: Fall Restrictions Weight Bearing Restrictions: Yes RLE Weight Bearing: Weight bearing as tolerated     Mobility  Bed Mobility Overal bed mobility: Needs Assistance Bed Mobility: Supine to Sit;Sit to Supine     Supine to sit: Min assist;Mod assist;HOB elevated (assist for trunk and R LE) Sit to supine: Max assist (assist for trunk and B LE's)   General bed mobility comments: vc's for technique    Transfers Overall transfer level: Needs assistance Equipment used: Rolling walker (2 wheels) Transfers: Sit to/from Stand;Bed to chair/wheelchair/BSC Sit to Stand: Mod assist (x2 trials standing up to RW) Stand pivot transfers: Max assist;+2 safety/equipment         General transfer comment: vc's and tactile cues for UE/LE placement and overall technique; assist to initiate stand up to RW and control descent sitting; max assist x1 stand pivot bed to/from [facility] with 2nd assist for safety transfering back to bed    Ambulation/Gait Ambulation/Gait assistance:  (pt unable to take any steps with max cueing and RW use (pt then incontinent of urine so pt assisted to sitting on edge of bed for safety))  Stairs             Wheelchair Mobility    Modified Rankin (Stroke Patients Only)       Balance Overall balance assessment: Needs assistance Sitting-balance support: Single extremity supported;Feet supported Sitting  balance-Leahy Scale: Fair Sitting balance - Comments: steady static sitting but tending to lean towards L to offload R hip (d/t R hip discomfort) Postural control: Left lateral lean Standing balance support: Bilateral upper extremity supported Standing balance-Leahy Scale: Poor Standing balance comment: vc's and tactile cues for upright posture required                            Cognition Arousal/Alertness: Awake/alert Behavior During Therapy: Impulsive Overall Cognitive Status: No family/caregiver present to determine baseline cognitive functioning                                 General Comments: Oriented to name and month of birth only; follows simple 1 step cues intermittently for short duration        Exercises      General Comments        Pertinent Vitals/Pain Pain Assessment: Faces Faces Pain Scale: Hurts a little bit (2/10 at rest; 4/10 with activity) Pain Location: R hip Pain Descriptors / Indicators: Grimacing;Guarding Pain Intervention(s): Limited activity within patient's tolerance;Monitored during session;Repositioned    Home Living                          Prior Function            PT Goals (current goals can now be found in the care plan section) Acute Rehab PT Goals PT Goal Formulation: Patient unable to participate in goal setting Time For Goal Achievement: 12/28/21 Potential to Achieve Goals: Fair Progress towards PT goals: Progressing toward goals    Frequency    7X/week      PT Plan Current plan remains appropriate    Co-evaluation              AM-PAC PT "6 Clicks" Mobility   Outcome Measure  Help needed turning from your back to your side while in a flat bed without using bedrails?: A Lot Help needed moving from lying on your back to sitting on the side of a flat bed without using bedrails?: A Lot Help needed moving to and from a bed to a chair (including a wheelchair)?: A Lot Help needed  standing up from a chair using your arms (e.g., wheelchair or bedside chair)?: A Lot Help needed to walk in hospital room?: Total Help needed climbing 3-5 steps with a railing? : Total 6 Click Score: 10    End of Session Equipment Utilized During Treatment: Gait belt Activity Tolerance: Patient tolerated treatment well Patient left: in bed;with call bell/phone within reach;with bed alarm set Nurse Communication: Mobility status;Precautions;Other (comment) (pt placed on 2 L O2 via nasal cannula d/t low O2 sats (nurse notified)) PT Visit Diagnosis: Unsteadiness on feet (R26.81);Muscle weakness (generalized) (M62.81);Difficulty in walking, not elsewhere classified (R26.2);Pain Pain - Right/Left: Right Pain - part of body: Hip     Time: 4315-4008 PT Time Calculation (min) (ACUTE ONLY): 38 min  Charges:  $Therapeutic Activity: 38-52 mins                    Chelsei Mcchesney, PT 12/18/21, 10:02 AM

## 2021-12-18 NOTE — Progress Notes (Signed)
Pending insurance

## 2021-12-18 NOTE — Progress Notes (Signed)
Report given to [place] at [facility].

## 2021-12-18 NOTE — TOC Progression Note (Signed)
Transition of Care [facility]) - Progression Note    Patient Details  Name: Morgan Stewart MRN: [id number] Date of Birth: 09-Jul-1946  Transition of Care [facility]) CM/SW [place], RN Phone Number: 12/18/2021, 9:34 AM  Clinical Narrative:   followed up with Isaias Cowman, Insurance is pending still    Expected Discharge Plan: [place] Barriers to Discharge: Continued Medical Work up  Expected Discharge Plan and Services Expected Discharge Plan: [place]       Living arrangements for the past 2 months: Single Family Home                                       Social Determinants of Health (SDOH) Interventions    Readmission Risk Interventions Readmission Risk Prevention Plan 12/14/2021  Post Dischage Appt Complete  Medication Screening Complete  Transportation Screening Complete  Some recent data might be hidden

## 2021-12-18 NOTE — Discharge Summary (Addendum)
Physician Discharge Summary  Dolce Sylvia SAY:[id number] DOB: 23-Sep-1946 DOA: 12/12/2021  PCP: Lequita Asal, MD (Inactive)  Admit date: 12/12/2021 Discharge date: 12/18/2021  Disposition: SNF  Recommendations for Outpatient Follow-up:  Follow up with PCP in 1-2 weeks Please obtain BMP/CBC in one week Please follow up on the following pending results:  Discharge Condition: Stable CODE STATUS: Full Diet recommendation: As tolerated  Brief/Interim Summary: Morgan Stewart is a 76 y.o. Caucasian female with medical history significant for Coronary artery disease, hypertension, COPD, DJD, gout, dyslipidemia, vitamin D deficiency, vitamin B12 deficiency and tobacco abuse, presented to the ER with a [place] of right hip pain after having a fall last night.  It is unclear if she had a syncope or not.  She stated that she could not get up after falling -unwitnessed event as she lives independently at home.  In the ED imaging remarkable for right femur fracture, orthopedics consulted, hospitalist called for admission.   Patient tolerated right hip fracture repair on 12/13/2021, otherwise remains somewhat hypotensive in the setting of poor p.o. intake but improving over the past few days.  She remains medically stable for discharge, awaiting family choice and insurance approval for SNF discharge and placement.  Patient incidentally noted to be COVID-positive at discharge, discharge will likely depend on excepting facilities protocol.  Remains medically stable for discharge.   Assessment & Plan:   Closed right hip fracture secondary to presumed mechanical fall, POA Acute rhabdomyolysis, resolving Transient hypoxia secondary to narcotics, resolving Constipation secondary to narcotic use -Orthopedics following, status post ORIF 12/29 -Pain currently well controlled -PT/OT ongoing - plan for dispo to SNF given recommendations -Continue as needed MiraLAX, GoLytely x1 today  Essential  hypertension, with notable postoperative hypotension -DC all BP meds - Hypotension resolved over the past 48h -Likely exacerbated by narcotics and poor PO intake  Dyslipidemia -continue statin Dementia -transient metabolic encephalopathy(POA) likely secondary to narcotics, back to baseline per family Depression continue Effexor, trazodone  Coronary artery disease.  Holding carvedilol and aspirin given hypotension and postoperative anticoagulation ongoing.  Discharge Diagnoses:  Principal Problem:   Closed right hip fracture [facility])    Discharge Instructions   Allergies as of 12/18/2021       Reactions   Ace Inhibitors Hives, Other (See Comments), Rash   Pt is not sure of reaction  Pt is not sure of reaction    Doxycycline Other (See Comments)   Pt is not sure of reaction  Pt is not sure of reaction    Codeine Nausea Only   Orlistat Rash, Other (See Comments)   Pt is not sure of reaction  Pt is not sure of reaction    Oxycodone Nausea And Vomiting   Oxycodone-acetaminophen Nausea Only   Sulfa Antibiotics Other (See Comments), Rash, Hives   Lowering serum sodium levels Lowering serum sodium levels Lowering serum sodium levels Lowering serum sodium levels        Medication List     STOP taking these medications    ibuprofen 600 MG tablet Commonly known as: ADVIL       TAKE these medications    acetaminophen 325 MG tablet Commonly known as: TYLENOL Take 2 tablets (650 mg total) by mouth every 6 (six) hours as needed for mild pain (or Fever >/= 101).   bisacodyl 10 MG suppository Commonly known as: DULCOLAX Place 1 suppository (10 mg total) rectally daily as needed for moderate constipation.   Cholecalciferol 50 MCG (2000 UT) Caps Take by mouth.  DeVilbiss Disposable Nebulizer Misc Use nebulizer every 6 hours as needed for wheezing.   docusate sodium 100 MG capsule Commonly known as: COLACE Take 1 capsule (100 mg total) by mouth 2 (two) times daily.    hydrALAZINE 25 MG tablet Commonly known as: APRESOLINE Take 25 mg by mouth 2 (two) times daily.   HYDROcodone-acetaminophen 5-325 MG tablet Commonly known as: NORCO/VICODIN Take 1-2 tablets by mouth every 4 (four) hours as needed for moderate pain (pain score 4-6).   ipratropium 0.06 % nasal spray Commonly known as: ATROVENT Place 2 sprays into the nose 3 (three) times daily. What changed:  how much to take when to take this   melatonin 1 MG Tabs tablet Take by mouth.   methocarbamol 500 MG tablet Commonly known as: ROBAXIN Take 1 tablet (500 mg total) by mouth every 6 (six) hours as needed for muscle spasms.   polyethylene glycol 17 g packet Commonly known as: MIRALAX / GLYCOLAX Take 17 g by mouth daily as needed for mild constipation.   rosuvastatin 40 MG tablet Commonly known as: CRESTOR Take 40 mg by mouth daily.   senna 8.6 MG Tabs tablet Commonly known as: SENOKOT Take 1 tablet (8.6 mg total) by mouth 2 (two) times daily.   traZODone 50 MG tablet Commonly known as: DESYREL Take 50-100 mg by mouth at bedtime as needed.   venlafaxine XR 150 MG 24 hr capsule Commonly known as: EFFEXOR-XR Take by mouth.        Contact information for after-discharge care     Destination     [facility] Preferred [facility]F .   Service: Skilled Nursing Contact information: [place] [address] [phone]                    Allergies  Allergen Reactions   Ace Inhibitors Hives, Other (See Comments) and Rash    Pt is not sure of reaction  Pt is not sure of reaction     Doxycycline Other (See Comments)    Pt is not sure of reaction  Pt is not sure of reaction     Codeine Nausea Only   Orlistat Rash and Other (See Comments)    Pt is not sure of reaction  Pt is not sure of reaction     Oxycodone Nausea And Vomiting   Oxycodone-Acetaminophen Nausea Only   Sulfa Antibiotics Other (See Comments), Rash and Hives     Lowering serum sodium levels Lowering serum sodium levels  Lowering serum sodium levels Lowering serum sodium levels    Consultations: Orthopedic surgery  Procedures/Studies: DG Chest 1 View  Result Date: 12/12/2021 CLINICAL DATA:  Right hip pain EXAM: CHEST  1 VIEW COMPARISON:  Chest CT dated June 20, 2020 FINDINGS: Cardiac and mediastinal contours within normal limits. Elevation of the left hemidiaphragm. Lungs are clear. No large pleural effusion or evidence pneumothorax. IMPRESSION: No active disease. Electronically Signed   By: Yetta Glassman M.D.   On: 12/12/2021 14:57   CT Head Wo Contrast  Result Date: 12/12/2021 CLINICAL DATA:  Unwitnessed fall. EXAM: CT HEAD WITHOUT CONTRAST CT CERVICAL SPINE WITHOUT CONTRAST TECHNIQUE: Multidetector CT imaging of the head and cervical spine was performed following the standard protocol without intravenous contrast. Multiplanar CT image reconstructions of the cervical spine were also generated. COMPARISON:  None. FINDINGS: CT HEAD FINDINGS Brain: Mild chronic ischemic white matter disease is noted. No mass effect or midline shift is noted. Ventricular size is within normal  limits. There is no evidence of mass lesion, hemorrhage or acute infarction. Vascular: No hyperdense vessel or unexpected calcification. Skull: Normal. Negative for fracture or focal lesion. Sinuses/Orbits: No acute finding. Other: None. CT CERVICAL SPINE FINDINGS Alignment: Mild grade 1 anterolisthesis of C3-4 and C4-5 is noted secondary to posterior facet joint hypertrophy. Skull base and vertebrae: No acute fracture. No primary bone lesion or focal pathologic process. Soft tissues and spinal canal: No prevertebral fluid or swelling. No visible canal hematoma. Disc levels: Severe degenerative disc disease is noted at C5-6 and C6-7 with anterior posterior osteophyte formation. Upper chest: Negative. Other: Degenerative changes are seen involving posterior facet joints bilaterally.  IMPRESSION: No acute intracranial abnormality seen. Severe multilevel degenerative disc disease. No acute abnormality seen in the cervical spine. Electronically Signed   By: Marijo Conception M.D.   On: 12/12/2021 15:23   CT Angio Chest Pulmonary Embolism (PE) W or WO Contrast  Result Date: 12/13/2021 CLINICAL DATA:  Syncopal episode with hypoxia. EXAM: CT ANGIOGRAPHY CHEST WITH CONTRAST TECHNIQUE: Multidetector CT imaging of the chest was performed using the standard protocol during bolus administration of intravenous contrast. Multiplanar CT image reconstructions and MIPs were obtained to evaluate the vascular anatomy. CONTRAST:  80mL OMNIPAQUE IOHEXOL 350 MG/ML SOLN COMPARISON:  Portable chest today, chest CT without contrast 06/20/2020. FINDINGS: Factors affecting image quality: Abundant respiratory motion. Cardiovascular: Pulmonary arteries are normal in caliber. There is a suspected segmental arterial filling defect suspicious for thrombus the medial segment of the right middle lobe on series 4 axial images 50 and 51. No other arterial filling defect is seen but the subsegmental arteries are obscured by abundant respiratory motion. This would be a very small embolic burden if truly present. The cardiac size is normal. Coronary arteries are heavily calcified. There is no pericardial effusion. There is lipomatous hypertrophy of the intra-atrial septum. There is aortic and great vessel atherosclerosis without dissection, moderate descending segment tortuosity, and slightly aneurysmal ascending segment measuring 4.2 cm. The remainder is within normal caliber limits. Mediastinum/Nodes: No enlarged mediastinal, hilar, or axillary lymph nodes. Thyroid gland, trachea, and esophagus demonstrate no significant findings. Lungs/Pleura: There is mild central bronchial thickening in the lower lobes consistent with bronchitis. This was previously more pronounced. The lungs are mildly emphysematous with centrilobular  changes predominating. No active lung infiltrate is seen. A few tiny noncalcified lung base nodules are again noted up to 5 mm. All are unchanged. There are areas of chronic scarring in the lower lung fields. There is no pleural effusion or pneumothorax. Mild chronic elevation left hemidiaphragm. Upper Abdomen: Gallbladder is distended and contains stones measuring up to 2.7 cm. No biliary dilatation. Postsurgical changes of the stomach are chronically seen. Musculoskeletal: There is osteopenia and degenerative changes of the spine. No focal osseous lesion in the thorax. Review of the MIP images confirms the above findings. IMPRESSION: 1. Study is motion-limited. There is a questionable medial segmental right middle lobe arterial filling defect on 2 images. There is no evidence of right heart strain or further visible embolus, but the subsegmental arterial bed is obscured due to breathing motion. 2. 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 Aortic atherosclerosis. 3. Pulmonary emphysema, with several scattered subcentimeter lung base nodules, unchanged, findings of at least mild lower lobe bronchitis without evidence of pneumonia. 4. Cholelithiasis with distended gallbladder without visible wall thickening. The gallbladder incompletely visualized and not well seen due to breathing motion. 5. Osteopenia and degenerative change. 6. Calcific CAD.  Lipomatous hypertrophy interatrial septum.  Electronically Signed   By: Telford Nab M.D.   On: 12/13/2021 03:36   CT Cervical Spine Wo Contrast  Result Date: 12/12/2021 CLINICAL DATA:  Unwitnessed fall. EXAM: CT HEAD WITHOUT CONTRAST CT CERVICAL SPINE WITHOUT CONTRAST TECHNIQUE: Multidetector CT imaging of the head and cervical spine was performed following the standard protocol without intravenous contrast. Multiplanar CT image reconstructions of the cervical spine were also generated. COMPARISON:  None. FINDINGS: CT HEAD FINDINGS Brain: Mild chronic ischemic white  matter disease is noted. No mass effect or midline shift is noted. Ventricular size is within normal limits. There is no evidence of mass lesion, hemorrhage or acute infarction. Vascular: No hyperdense vessel or unexpected calcification. Skull: Normal. Negative for fracture or focal lesion. Sinuses/Orbits: No acute finding. Other: None. CT CERVICAL SPINE FINDINGS Alignment: Mild grade 1 anterolisthesis of C3-4 and C4-5 is noted secondary to posterior facet joint hypertrophy. Skull base and vertebrae: No acute fracture. No primary bone lesion or focal pathologic process. Soft tissues and spinal canal: No prevertebral fluid or swelling. No visible canal hematoma. Disc levels: Severe degenerative disc disease is noted at C5-6 and C6-7 with anterior posterior osteophyte formation. Upper chest: Negative. Other: Degenerative changes are seen involving posterior facet joints bilaterally. IMPRESSION: No acute intracranial abnormality seen. Severe multilevel degenerative disc disease. No acute abnormality seen in the cervical spine. Electronically Signed   By: Marijo Conception M.D.   On: 12/12/2021 15:23   CT Hip Right Wo Contrast  Result Date: 12/12/2021 CLINICAL DATA:  [place].  Hip fracture. EXAM: CT OF THE RIGHT HIP WITHOUT CONTRAST TECHNIQUE: Multidetector CT imaging of the right hip was performed according to the standard protocol. Multiplanar CT image reconstructions were also generated. COMPARISON:  Radiographs, same date. FINDINGS: There is a complex comminuted intertrochanteric fracture of the right hip with moderate impaction. The femoral head neck are intact. No acetabular fracture. The visualized right hemipelvic bony structures are intact. Chondrocalcinosis noted at the pubic symphysis. Associated soft tissue swelling and intramuscular hemorrhage involving the right hip and thigh muscles. Benign-appearing subcutaneous lipoma noted overlying the right hip musculature. IMPRESSION: 1. Complex comminuted  intertrochanteric fracture of the right hip with moderate impaction. 2. Associated soft tissue swelling and intramuscular hemorrhage involving the right hip and thigh muscles. Electronically Signed   By: Marijo Sanes M.D.   On: 12/12/2021 22:02   DG C-Arm 1-60 Min-No Report  Result Date: 12/13/2021 CLINICAL DATA:  Intramedullary nail. EXAM: DG HIP (WITH OR WITHOUT PELVIS) 2-3V RIGHT; DG C-ARM 1-60 MIN-NO REPORT COMPARISON:  Fracture of the RIGHT hip. FINDINGS: Post intramedullary nail placement with dynamic hip screw. No unexpected immediate postprocedural complications on limited assessment. Rod extends to the distal femur. Distal interlocking screws are also in place. Soft tissue gas is noted about the surgical site. FLUOROSCOPY TIME:  2 minutes 6 seconds Fluoroscopy dose: 13.86 mGy IMPRESSION: Status post ORIF of the right hip. No unexpected postprocedural complications. Electronically Signed   By: Zetta Bills M.D.   On: 12/13/2021 15:39   DG HIP UNILAT WITH PELVIS 2-3 VIEWS RIGHT  Result Date: 12/13/2021 CLINICAL DATA:  Intramedullary nail. EXAM: DG HIP (WITH OR WITHOUT PELVIS) 2-3V RIGHT; DG C-ARM 1-60 MIN-NO REPORT COMPARISON:  Fracture of the RIGHT hip. FINDINGS: Post intramedullary nail placement with dynamic hip screw. No unexpected immediate postprocedural complications on limited assessment. Rod extends to the distal femur. Distal interlocking screws are also in place. Soft tissue gas is noted about the surgical site. FLUOROSCOPY TIME:  2  minutes 6 seconds Fluoroscopy dose: 13.86 mGy IMPRESSION: Status post ORIF of the right hip. No unexpected postprocedural complications. Electronically Signed   By: Zetta Bills M.D.   On: 12/13/2021 15:39   DG Hip Unilat With Pelvis 2-3 Views Right  Result Date: 12/12/2021 CLINICAL DATA:  RIGHT leg pain, deformity and dementia. EXAM: DG HIP (WITH OR WITHOUT PELVIS) 2-3V RIGHT COMPARISON:  None FINDINGS: Over riding, fracture with both Verus and apex  anterior angulation involving the intratrochanteric RIGHT femur and associated with mild comminution. Femoral head is located. LEFT hip unremarkable on AP projection. No sign of fracture of the bony pelvis. Degenerative changes in the lower lumbar spine. IMPRESSION: Angulated, comminuted, and overriding intertrochanteric fracture of the RIGHT femur. Electronically Signed   By: Zetta Bills M.D.   On: 12/12/2021 14:58   DG FEMUR, MIN 2 VIEWS RIGHT  Result Date: 12/13/2021 CLINICAL DATA:  Postop right femur EXAM: RIGHT FEMUR 2 VIEWS COMPARISON:  12/13/2021, 12/12/2021 FINDINGS: Interval intramedullary rodding of comminuted right intertrochanteric fracture. Displaced lesser trochanteric fracture fragment. Hardware is intact. Gas in the soft tissues consistent with recent surgery IMPRESSION: Interval surgical fixation of comminuted right intertrochanteric fracture with expected postsurgical change Electronically Signed   By: Donavan Foil M.D.   On: 12/13/2021 17:19     Subjective: No acute issues or events overnight   Discharge Exam: Vitals:   12/18/21 0430 12/18/21 0808  BP: 140/70 (!) 145/86  Pulse: 90 (!) 101  Resp: 20 16  Temp: 98.7 F (37.1 C) 98.1 F (36.7 C)  SpO2: 98% (!) 86%   Vitals:   12/17/21 1613 12/17/21 1949 12/18/21 0430 12/18/21 0808  BP: (!) 153/92 (!) 156/85 140/70 (!) 145/86  Pulse: 85  90 (!) 101  Resp: 18 20 20 16   Temp: 98.3 F (36.8 C) 98.9 F (37.2 C) 98.7 F (37.1 C) 98.1 F (36.7 C)  TempSrc:      SpO2: 100% 98% 98% (!) 86%  Weight:      Height:        General: Pt is alert, awake, not in acute distress Cardiovascular: RRR, S1/S2 +, no rubs, no gallops Respiratory: CTA bilaterally, no wheezing, no rhonchi Abdominal: Soft, NT, ND, bowel sounds + Extremities: no edema, no cyanosis    The results of significant diagnostics from this hospitalization (including imaging, microbiology, ancillary and laboratory) are listed below for reference.      Microbiology: Recent Results (from the past 240 hour(s))  Resp Panel by RT-PCR (Flu A&B, Covid) Nasopharyngeal Swab     Status: None   Collection Time: 12/12/21  3:06 PM   Specimen: Nasopharyngeal Swab; Nasopharyngeal(NP) swabs in vial transport medium  Result Value Ref Range Status   SARS Coronavirus 2 by RT PCR NEGATIVE NEGATIVE Final    Comment: (NOTE) SARS-CoV-2 target nucleic acids are NOT DETECTED.  The SARS-CoV-2 RNA is generally detectable in upper respiratory specimens during the acute phase of infection. The lowest concentration of SARS-CoV-2 viral copies this assay can detect is 138 copies/mL. A negative result does not preclude SARS-Cov-2 infection and should not be used as the sole basis for treatment or other patient management decisions. A negative result may occur with  improper specimen collection/handling, submission of specimen other than nasopharyngeal swab, presence of viral mutation(s) within the areas targeted by this assay, and inadequate number of viral copies(<138 copies/mL). A negative result must be combined with clinical observations, patient history, and epidemiological information. The expected result is Negative.  Fact Sheet for  Patients:  EntrepreneurPulse.com.au  Fact Sheet for Healthcare Providers:  IncredibleEmployment.be  This test is no t yet approved or cleared by the [place] FDA and  has been authorized for detection and/or diagnosis of SARS-CoV-2 by FDA under an Emergency Use Authorization (EUA). This EUA will remain  in effect (meaning this test can be used) for the duration of the COVID-19 declaration under Section 564(b)(1) of the Act, 21 U.S.C.section 360bbb-3(b)(1), unless the authorization is terminated  or revoked sooner.       Influenza A by PCR NEGATIVE NEGATIVE Final   Influenza B by PCR NEGATIVE NEGATIVE Final    Comment: (NOTE) The Xpert Xpress SARS-CoV-2/FLU/RSV plus assay is  intended as an aid in the diagnosis of influenza from Nasopharyngeal swab specimens and should not be used as a sole basis for treatment. Nasal washings and aspirates are unacceptable for Xpert Xpress SARS-CoV-2/FLU/RSV testing.  Fact Sheet for Patients: EntrepreneurPulse.com.au  Fact Sheet for Healthcare Providers: IncredibleEmployment.be  This test is not yet approved or cleared by the [place] FDA and has been authorized for detection and/or diagnosis of SARS-CoV-2 by FDA under an Emergency Use Authorization (EUA). This EUA will remain in effect (meaning this test can be used) for the duration of the COVID-19 declaration under Section 564(b)(1) of the Act, 21 U.S.C. section 360bbb-3(b)(1), unless the authorization is terminated or revoked.  Performed at [facility], [address] [place], [place] [address]      Labs: BNP (last 3 results) No results for input(s): BNP in the last 8760 hours. Basic Metabolic Panel: Recent Labs  Lab 12/12/21 1506 12/13/21 0332 12/14/21 0649 12/16/21 0614 12/18/21 0525  NA 144 139 142 139 140  K 3.8 4.1 4.0 3.5 3.3*  CL 107 107 110 108 108  CO2 25 26 24 24 27   GLUCOSE 102* 108* 103* 105* 90  BUN 37* 35* 34* 30* 19  CREATININE 1.16* 1.12* 0.96 0.78 0.66  CALCIUM 10.0 9.3 8.8* 9.4 8.6*   Liver Function Tests: Recent Labs  Lab 12/12/21 1506  AST 82*  ALT 33  ALKPHOS 69  BILITOT 1.7*  PROT 6.6  ALBUMIN 4.1   No results for input(s): LIPASE, AMYLASE in the last 168 hours. No results for input(s): AMMONIA in the last 168 hours. CBC: Recent Labs  Lab 12/12/21 1506 12/13/21 0332 12/14/21 0649 12/16/21 0614 12/18/21 0525  WBC 10.2 9.5 6.5 6.2 4.3  NEUTROABS 8.3*  --   --   --   --   HGB 12.2 10.4* 8.7* 9.0* 8.6*  HCT 36.6 32.1* 26.5* 27.8* 26.0*  MCV 94.1 93.6 96.0 96.5 93.9  PLT 112* 98* 81* 125* 131*   Cardiac Enzymes: Recent Labs  Lab 12/12/21 1506 12/13/21 0332  12/13/21 1801 12/16/21 0614 12/18/21 0525  CKTOTAL 2,439* 1,582* 980* 796* 344*   BNP: Invalid input(s): POCBNP CBG: No results for input(s): GLUCAP in the last 168 hours. D-Dimer No results for input(s): DDIMER in the last 72 hours. Hgb A1c No results for input(s): HGBA1C in the last 72 hours. Lipid Profile No results for input(s): CHOL, HDL, LDLCALC, TRIG, CHOLHDL, LDLDIRECT in the last 72 hours. Thyroid function studies No results for input(s): TSH, T4TOTAL, T3FREE, THYROIDAB in the last 72 hours.  Invalid input(s): FREET3 Anemia work up No results for input(s): VITAMINB12, FOLATE, FERRITIN, TIBC, IRON, RETICCTPCT in the last 72 hours. Urinalysis No results found for: COLORURINE, APPEARANCEUR, LABSPEC, PHURINE, GLUCOSEU, HGBUR, BILIRUBINUR, KETONESUR, PROTEINUR, UROBILINOGEN, NITRITE, LEUKOCYTESUR Sepsis Labs Invalid input(s): PROCALCITONIN,  WBC,  LACTICIDVEN Microbiology Recent Results (from the past 240 hour(s))  Resp Panel by RT-PCR (Flu A&B, Covid) Nasopharyngeal Swab     Status: None   Collection Time: 12/12/21  3:06 PM   Specimen: Nasopharyngeal Swab; Nasopharyngeal(NP) swabs in vial transport medium  Result Value Ref Range Status   SARS Coronavirus 2 by RT PCR NEGATIVE NEGATIVE Final    Comment: (NOTE) SARS-CoV-2 target nucleic acids are NOT DETECTED.  The SARS-CoV-2 RNA is generally detectable in upper respiratory specimens during the acute phase of infection. The lowest concentration of SARS-CoV-2 viral copies this assay can detect is 138 copies/mL. A negative result does not preclude SARS-Cov-2 infection and should not be used as the sole basis for treatment or other patient management decisions. A negative result may occur with  improper specimen collection/handling, submission of specimen other than nasopharyngeal swab, presence of viral mutation(s) within the areas targeted by this assay, and inadequate number of viral copies(<138 copies/mL). A negative  result must be combined with clinical observations, patient history, and epidemiological information. The expected result is Negative.  Fact Sheet for Patients:  EntrepreneurPulse.com.au  Fact Sheet for Healthcare Providers:  IncredibleEmployment.be  This test is no t yet approved or cleared by the [place] FDA and  has been authorized for detection and/or diagnosis of SARS-CoV-2 by FDA under an Emergency Use Authorization (EUA). This EUA will remain  in effect (meaning this test can be used) for the duration of the COVID-19 declaration under Section 564(b)(1) of the Act, 21 U.S.C.section 360bbb-3(b)(1), unless the authorization is terminated  or revoked sooner.       Influenza A by PCR NEGATIVE NEGATIVE Final   Influenza B by PCR NEGATIVE NEGATIVE Final    Comment: (NOTE) The Xpert Xpress SARS-CoV-2/FLU/RSV plus assay is intended as an aid in the diagnosis of influenza from Nasopharyngeal swab specimens and should not be used as a sole basis for treatment. Nasal washings and aspirates are unacceptable for Xpert Xpress SARS-CoV-2/FLU/RSV testing.  Fact Sheet for Patients: EntrepreneurPulse.com.au  Fact Sheet for Healthcare Providers: IncredibleEmployment.be  This test is not yet approved or cleared by the [place] FDA and has been authorized for detection and/or diagnosis of SARS-CoV-2 by FDA under an Emergency Use Authorization (EUA). This EUA will remain in effect (meaning this test can be used) for the duration of the COVID-19 declaration under Section 564(b)(1) of the Act, 21 U.S.C. section 360bbb-3(b)(1), unless the authorization is terminated or revoked.  Performed at [facility], [address], [place], [place] [address]      Time coordinating discharge: Over 30 minutes  SIGNED:   Little Ishikawa, DO [facility] 12/18/2021, 2:29 PM Pager   If 7PM-7AM,  please contact night-coverage [email or web address]

## 2021-12-18 NOTE — Progress Notes (Signed)
°  Subjective:  POD #5 s/p intramedullary fixation for right intertrochanteric hip fracture.   Patient awake and more alert today.  Patient reports right hip pain as mild.    Objective:   VITALS:   Vitals:   12/17/21 1613 12/17/21 1949 12/18/21 0430 12/18/21 0808  BP: (!) 153/92 (!) 156/85 140/70 (!) 145/86  Pulse: 85  90 (!) 101  Resp: 18 20 20 16   Temp: 98.3 F (36.8 C) 98.9 F (37.2 C) 98.7 F (37.1 C) 98.1 F (36.7 C)  TempSrc:      SpO2: 100% 98% 98% (!) 86%  Weight:      Height:        PHYSICAL EXAM: Right lower extremity: Dressings over right thigh incisions have been removed.  There is no drainage.  There is no erythema or fluctuance. Neurovascular intact Sensation intact distally Intact pulses distally Dorsiflexion/Plantar flexion intact Incision: no drainage No cellulitis present Compartment soft  LABS  Results for orders placed or performed during the hospital encounter of 12/12/21 (from the past 24 hour(s))  CBC     Status: Abnormal   Collection Time: 12/18/21  5:25 AM  Result Value Ref Range   WBC 4.3 4.0 - 10.5 K/uL   RBC 2.77 (L) 3.87 - 5.11 MIL/uL   Hemoglobin 8.6 (L) 12.0 - 15.0 g/dL   HCT 26.0 (L) 36.0 - 46.0 %   MCV 93.9 80.0 - 100.0 fL   MCH 31.0 26.0 - 34.0 pg   MCHC 33.1 30.0 - 36.0 g/dL   RDW 13.7 11.5 - 15.5 %   Platelets 131 (L) 150 - 400 K/uL   nRBC 0.0 0.0 - 0.2 %  Basic metabolic panel     Status: Abnormal   Collection Time: 12/18/21  5:25 AM  Result Value Ref Range   Sodium 140 135 - 145 mmol/L   Potassium 3.3 (L) 3.5 - 5.1 mmol/L   Chloride 108 98 - 111 mmol/L   CO2 27 22 - 32 mmol/L   Glucose, Bld 90 70 - 99 mg/dL   BUN 19 8 - 23 mg/dL   Creatinine, Ser 0.66 0.44 - 1.00 mg/dL   Calcium 8.6 (L) 8.9 - 10.3 mg/dL   GFR, Estimated >60 >60 mL/min   Anion gap 5 5 - 15  CK     Status: Abnormal   Collection Time: 12/18/21  5:25 AM  Result Value Ref Range   Total CK 344 (H) 38 - 234 U/L    No results  found.  Assessment/Plan: 5 Days Post-Op   Principal Problem:   Closed right hip fracture [facility])  Patient will be discharged to a skilled nursing facility.  She will continue physical therapy there.  Patient will follow-up with me in the office in 10 to 14 days for reevaluation and x-ray.  Patient should continue on Lovenox 40 mg daily or aspirin 325 mg p.o. twice daily for DVT prophylaxis until follow-up.   Thornton Park , MD 12/18/2021, 12:46 PM

## 2021-12-18 NOTE — Progress Notes (Signed)
Attempted to give report to [facility].  Voice message left.  Patient going to room 604.

## 2021-12-18 NOTE — Progress Notes (Signed)
Notified Collie Siad at [facility] of pt's positive covid test.

## 2021-12-18 NOTE — Progress Notes (Signed)
Dr Avon Gully notified of pt being positive for covid.

## 2021-12-18 NOTE — Progress Notes (Signed)
PROGRESS NOTE    Morgan Stewart  EVO:[id number] DOB: August 01, 1946 DOA: 12/12/2021 PCP: Lequita Asal, MD (Inactive)   Brief Narrative:  Morgan Stewart is a 76 y.o. Caucasian female with medical history significant for Coronary artery disease, hypertension, COPD, DJD, gout, dyslipidemia, vitamin D deficiency, vitamin B12 deficiency and tobacco abuse, presented to the ER with a [place] of right hip pain after having a fall last night.  It is unclear if she had a syncope or not.  She stated that she could not get up after falling -unwitnessed event as she lives independently at home.  In the ED imaging remarkable for right femur fracture, orthopedics consulted, hospitalist called for admission.  Patient tolerated right hip fracture repair on 12/13/2021, otherwise remains somewhat hypotensive in the setting of poor p.o. intake but improving over the past few days.  She remains medically stable for discharge, awaiting family choice and insurance approval for SNF discharge and placement.  Assessment & Plan:  Closed right hip fracture secondary to presumed mechanical fall, POA Acute rhabdomyolysis, resolving Transient hypoxia secondary to narcotics, resolving Constipation secondary to narcotic use -Orthopedics following, status post ORIF 12/29 -Pain currently well controlled -PT/OT ongoing - plan for dispo to SNF given recommendations -No BM despite increased MiraLAX, GoLytely x1 today  Essential hypertension, with notable postoperative hypotension -DC all BP meds - Hypotension resolved over the past 48h -Likely exacerbated by narcotics and poor PO intake  Dyslipidemia -continue statin Dementia -transient metabolic encephalopathy(POA) likely secondary to narcotics, back to baseline per family Depression continue Effexor, trazodone  Coronary artery disease.  Holding carvedilol and aspirin given hypotension and postoperative anticoagulation ongoing.  DVT prophylaxis: Lovenox. Code  Status: full code. Family Communication: Updated over the phone  Status is: Inpatient  Dispo: The patient is from: Home              Anticipated d/c is to: SNF              Anticipated d/c date is: 24-48 hours              Patient currently not medically stable for discharge  Consultants:  Orthopedic surgery  Procedures:  ORIF right femur 12/13/2021  Antimicrobials:  None indicated  Subjective: no acute issues or events overnight, much more awake alert this morning appears to be at baseline denies nausea vomiting diarrhea constipation headache fevers chills or chest pain  Objective: Vitals:   12/17/21 1613 12/17/21 1949 12/18/21 0430 12/18/21 0808  BP: (!) 153/92 (!) 156/85 140/70 (!) 145/86  Pulse: 85  90 (!) 101  Resp: 18 20 20 16   Temp: 98.3 F (36.8 C) 98.9 F (37.2 C) 98.7 F (37.1 C) 98.1 F (36.7 C)  TempSrc:      SpO2: 100% 98% 98% (!) 86%  Weight:      Height:        Intake/Output Summary (Last 24 hours) at 12/18/2021 1252 Last data filed at 12/18/2021 0029 Gross per 24 hour  Intake --  Output 300 ml  Net -300 ml    Filed Weights   12/12/21 1420  Weight: 72 kg    Examination:  General exam: Appears calm and comfortable  Respiratory system: Clear to auscultation. Respiratory effort normal. Cardiovascular system: S1 & S2 heard, RRR. No JVD, murmurs, rubs, gallops or clicks. No pedal edema. Gastrointestinal system: Abdomen is nondistended, soft and nontender. No organomegaly or masses felt. Normal bowel sounds heard. Central nervous system: Alert and oriented. No focal neurological deficits. Extremities: Symmetric  5 x 5 power. Skin: No rashes, lesions or ulcers Psychiatry: Judgement and insight limited due to dementia  Data Reviewed: I have personally reviewed following labs and imaging studies  CBC: Recent Labs  Lab 12/12/21 1506 12/13/21 0332 12/14/21 0649 12/16/21 0614 12/18/21 0525  WBC 10.2 9.5 6.5 6.2 4.3  NEUTROABS 8.3*  --   --   --    --   HGB 12.2 10.4* 8.7* 9.0* 8.6*  HCT 36.6 32.1* 26.5* 27.8* 26.0*  MCV 94.1 93.6 96.0 96.5 93.9  PLT 112* 98* 81* 125* 131*    Basic Metabolic Panel: Recent Labs  Lab 12/12/21 1506 12/13/21 0332 12/14/21 0649 12/16/21 0614 12/18/21 0525  NA 144 139 142 139 140  K 3.8 4.1 4.0 3.5 3.3*  CL 107 107 110 108 108  CO2 25 26 24 24 27   GLUCOSE 102* 108* 103* 105* 90  BUN 37* 35* 34* 30* 19  CREATININE 1.16* 1.12* 0.96 0.78 0.66  CALCIUM 10.0 9.3 8.8* 9.4 8.6*    GFR: Estimated Creatinine Clearance: 61.8 mL/min (by C-G formula based on SCr of 0.66 mg/dL). Liver Function Tests: Recent Labs  Lab 12/12/21 1506  AST 82*  ALT 33  ALKPHOS 69  BILITOT 1.7*  PROT 6.6  ALBUMIN 4.1    No results for input(s): LIPASE, AMYLASE in the last 168 hours. No results for input(s): AMMONIA in the last 168 hours. Coagulation Profile: No results for input(s): INR, PROTIME in the last 168 hours. Cardiac Enzymes: Recent Labs  Lab 12/12/21 1506 12/13/21 0332 12/13/21 1801 12/16/21 0614 12/18/21 0525  CKTOTAL 2,439* 1,582* 980* 796* 344*    BNP (last 3 results) No results for input(s): PROBNP in the last 8760 hours. HbA1C: No results for input(s): HGBA1C in the last 72 hours. CBG: No results for input(s): GLUCAP in the last 168 hours. Lipid Profile: No results for input(s): CHOL, HDL, LDLCALC, TRIG, CHOLHDL, LDLDIRECT in the last 72 hours. Thyroid Function Tests: No results for input(s): TSH, T4TOTAL, FREET4, T3FREE, THYROIDAB in the last 72 hours. Anemia Panel: No results for input(s): VITAMINB12, FOLATE, FERRITIN, TIBC, IRON, RETICCTPCT in the last 72 hours. Sepsis Labs: No results for input(s): PROCALCITON, LATICACIDVEN in the last 168 hours.  Recent Results (from the past 240 hour(s))  Resp Panel by RT-PCR (Flu A&B, Covid) Nasopharyngeal Swab     Status: None   Collection Time: 12/12/21  3:06 PM   Specimen: Nasopharyngeal Swab; Nasopharyngeal(NP) swabs in vial transport  medium  Result Value Ref Range Status   SARS Coronavirus 2 by RT PCR NEGATIVE NEGATIVE Final    Comment: (NOTE) SARS-CoV-2 target nucleic acids are NOT DETECTED.  The SARS-CoV-2 RNA is generally detectable in upper respiratory specimens during the acute phase of infection. The lowest concentration of SARS-CoV-2 viral copies this assay can detect is 138 copies/mL. A negative result does not preclude SARS-Cov-2 infection and should not be used as the sole basis for treatment or other patient management decisions. A negative result may occur with  improper specimen collection/handling, submission of specimen other than nasopharyngeal swab, presence of viral mutation(s) within the areas targeted by this assay, and inadequate number of viral copies(<138 copies/mL). A negative result must be combined with clinical observations, patient history, and epidemiological information. The expected result is Negative.  Fact Sheet for Patients:  EntrepreneurPulse.com.au  Fact Sheet for Healthcare Providers:  IncredibleEmployment.be  This test is no t yet approved or cleared by the [place] FDA and  has been authorized for detection  and/or diagnosis of SARS-CoV-2 by FDA under an Emergency Use Authorization (EUA). This EUA will remain  in effect (meaning this test can be used) for the duration of the COVID-19 declaration under Section 564(b)(1) of the Act, 21 U.S.C.section 360bbb-3(b)(1), unless the authorization is terminated  or revoked sooner.       Influenza A by PCR NEGATIVE NEGATIVE Final   Influenza B by PCR NEGATIVE NEGATIVE Final    Comment: (NOTE) The Xpert Xpress SARS-CoV-2/FLU/RSV plus assay is intended as an aid in the diagnosis of influenza from Nasopharyngeal swab specimens and should not be used as a sole basis for treatment. Nasal washings and aspirates are unacceptable for Xpert Xpress SARS-CoV-2/FLU/RSV testing.  Fact Sheet for  Patients: EntrepreneurPulse.com.au  Fact Sheet for Healthcare Providers: IncredibleEmployment.be  This test is not yet approved or cleared by the [place] FDA and has been authorized for detection and/or diagnosis of SARS-CoV-2 by FDA under an Emergency Use Authorization (EUA). This EUA will remain in effect (meaning this test can be used) for the duration of the COVID-19 declaration under Section 564(b)(1) of the Act, 21 U.S.C. section 360bbb-3(b)(1), unless the authorization is terminated or revoked.  Performed at [facility], [address], [place], [place] [address]    Radiology Studies: No results found.  Scheduled Meds:  Chlorhexidine Gluconate Cloth  6 each Topical Daily   cholecalciferol  2,000 Units Oral Daily   docusate sodium  100 mg Oral BID   enoxaparin (LOVENOX) injection  30 mg Subcutaneous Q24H   ipratropium  2 spray Nasal TID   polyethylene glycol-electrolytes  4,000 mL Oral Once   rosuvastatin  40 mg Oral Daily   senna  1 tablet Oral BID   traMADol  50 mg Oral Q6H   Continuous Infusions:  sodium chloride Stopped (12/15/21 1543)   methocarbamol (ROBAXIN) IV       LOS: 6 days   Time spent: 79min  Pheonix Clinkscale C Jennalee Greaves, DO [facility]  If 7PM-7AM, please contact night-coverage [email or web address]  12/18/2021, 12:52 PM

## 2021-12-18 NOTE — TOC Progression Note (Signed)
Transition of Care [facility]) - Progression Note    Patient Details  Name: Morgan Stewart MRN: [id number] Date of Birth: July 28, 1946  Transition of Care [facility]) CM/SW [place], RN Phone Number: 12/18/2021, 2:11 PM  Clinical Narrative:   received notice that ins was approved  She needs a covid test and to have a BM, to go to [facility]    Expected Discharge Plan: [place] Barriers to Discharge: Continued Medical Work up  Expected Discharge Plan and Services Expected Discharge Plan: [place] arrangements for the past 2 months: Single Family Home                                       Social Determinants of Health (SDOH) Interventions    Readmission Risk Interventions Readmission Risk Prevention Plan 12/14/2021  Post Dischage Appt Complete  Medication Screening Complete  Transportation Screening Complete  Some recent data might be hidden

## 2021-12-19 DIAGNOSIS — T796XXA Traumatic ischemia of muscle, initial encounter: Secondary | ICD-10-CM | POA: Insufficient documentation

## 2021-12-19 DIAGNOSIS — R0902 Hypoxemia: Secondary | ICD-10-CM | POA: Insufficient documentation

## 2021-12-19 DIAGNOSIS — S72141A Displaced intertrochanteric fracture of right femur, initial encounter for closed fracture: Secondary | ICD-10-CM | POA: Diagnosis not present

## 2021-12-19 DIAGNOSIS — U071 COVID-19: Secondary | ICD-10-CM | POA: Diagnosis not present

## 2021-12-19 LAB — CBC
HCT: 27 % — ABNORMAL LOW (ref 36.0–46.0)
Hemoglobin: 8.8 g/dL — ABNORMAL LOW (ref 12.0–15.0)
MCH: 31 pg (ref 26.0–34.0)
MCHC: 32.6 g/dL (ref 30.0–36.0)
MCV: 95.1 fL (ref 80.0–100.0)
Platelets: 144 10*3/uL — ABNORMAL LOW (ref 150–400)
RBC: 2.84 MIL/uL — ABNORMAL LOW (ref 3.87–5.11)
RDW: 14.4 % (ref 11.5–15.5)
WBC: 5.7 10*3/uL (ref 4.0–10.5)
nRBC: 0 % (ref 0.0–0.2)

## 2021-12-19 LAB — BASIC METABOLIC PANEL
Anion gap: 9 (ref 5–15)
BUN: 20 mg/dL (ref 8–23)
CO2: 27 mmol/L (ref 22–32)
Calcium: 8.7 mg/dL — ABNORMAL LOW (ref 8.9–10.3)
Chloride: 104 mmol/L (ref 98–111)
Creatinine, Ser: 0.82 mg/dL (ref 0.44–1.00)
GFR, Estimated: >60 mL/min (ref >60)
Glucose, Bld: 81 mg/dL (ref 70–99)
Potassium: 3.2 mmol/L — ABNORMAL LOW (ref 3.5–5.1)
Sodium: 140 mmol/L (ref 135–145)

## 2021-12-19 MED ORDER — HYDROCODONE-ACETAMINOPHEN 5-325 MG PO TABS
1.0000 | ORAL_TABLET | Freq: Four times a day (QID) | ORAL | Status: DC | PRN
  Administered 2021-12-21: 1 via ORAL
  Filled 2021-12-19: qty 1

## 2021-12-19 MED ORDER — POTASSIUM CHLORIDE CRYS ER 20 MEQ PO TBCR
40.0000 meq | EXTENDED_RELEASE_TABLET | Freq: Two times a day (BID) | ORAL | Status: AC
  Administered 2021-12-19 (×2): 40 meq via ORAL
  Filled 2021-12-19 (×2): qty 2

## 2021-12-19 MED ORDER — ENOXAPARIN SODIUM 40 MG/0.4ML IJ SOSY
40.0000 mg | PREFILLED_SYRINGE | INTRAMUSCULAR | Status: DC
  Administered 2021-12-20 – 2021-12-31 (×12): 40 mg via SUBCUTANEOUS
  Filled 2021-12-19 (×12): qty 0.4

## 2021-12-19 NOTE — TOC Progression Note (Signed)
Transition of Care [facility]) - Progression Note    Patient Details  Name: Morgan Stewart MRN: [id number] Date of Birth: [birth year]  Transition of Care [facility]) CM/SW [place], RN Phone Number: 12/19/2021, 10:42 AM  Clinical Narrative:   Due to being Covid positive, the patient is not able to DC to [facility] until 1/14    Expected Discharge Plan: [place] Barriers to Discharge: Continued Medical Work up  Expected Discharge Plan and Services Expected Discharge Plan: [place] arrangements for the past 2 months: Single Family Home Expected Discharge Date: 12/18/21                                     Social Determinants of Health (SDOH) Interventions    Readmission Risk Interventions Readmission Risk Prevention Plan 12/14/2021  Post Dischage Appt Complete  Medication Screening Complete  Transportation Screening Complete  Some recent data might be hidden

## 2021-12-19 NOTE — Progress Notes (Signed)
Subjective:  POD #6 s/p intramedullary fixation for right intertrochanteric hip fracture.  Patient has a history of dementia.  She is awake and responsive. Patient denies any significant right hip or lower extremity pain.  Discharge to SNF was canceled after the patient tested positive for COVID yesterday.  Patient will remain in the hospital now for 10 days.  Objective:   VITALS:   Vitals:   12/18/21 1946 12/18/21 2334 12/19/21 0736 12/19/21 1154  BP: (!) 148/83 (!) 145/83 110/85 (!) 131/92  Pulse: 100 (!) 101 91 89  Resp: 16 18 17 17   Temp: 99.2 F (37.3 C) 98.7 F (37.1 C) 99.1 F (37.3 C) 98.6 F (37 C)  TempSrc:      SpO2:   92% (!) 89%  Weight:      Height:        PHYSICAL EXAM: Right lower extremity: Patient follows commands on exam today.  She has pulled off her honeycomb dressings which had a small amount of serosanguineous drainage. Neurovascular intact Sensation intact distally Intact pulses distally Dorsiflexion/Plantar flexion intact Incision: No active drainage.  No erythema or swelling around the incisions. No cellulitis present Compartment soft  LABS  Results for orders placed or performed during the hospital encounter of 12/12/21 (from the past 24 hour(s))  Resp Panel by RT-PCR (Flu A&B, Covid) Nasopharyngeal Swab     Status: Abnormal   Collection Time: 12/18/21  2:30 PM   Specimen: Nasopharyngeal Swab; Nasopharyngeal(NP) swabs in vial transport medium  Result Value Ref Range   SARS Coronavirus 2 by RT PCR POSITIVE (A) NEGATIVE   Influenza A by PCR NEGATIVE NEGATIVE   Influenza B by PCR NEGATIVE NEGATIVE  Basic metabolic panel     Status: Abnormal   Collection Time: 12/19/21  8:15 AM  Result Value Ref Range   Sodium 140 135 - 145 mmol/L   Potassium 3.2 (L) 3.5 - 5.1 mmol/L   Chloride 104 98 - 111 mmol/L   CO2 27 22 - 32 mmol/L   Glucose, Bld 81 70 - 99 mg/dL   BUN 20 8 - 23 mg/dL   Creatinine, Ser 0.82 0.44 - 1.00 mg/dL   Calcium 8.7 (L) 8.9 -  10.3 mg/dL   GFR, Estimated >60 >60 mL/min   Anion gap 9 5 - 15  CBC     Status: Abnormal   Collection Time: 12/19/21  8:15 AM  Result Value Ref Range   WBC 5.7 4.0 - 10.5 K/uL   RBC 2.84 (L) 3.87 - 5.11 MIL/uL   Hemoglobin 8.8 (L) 12.0 - 15.0 g/dL   HCT 27.0 (L) 36.0 - 46.0 %   MCV 95.1 80.0 - 100.0 fL   MCH 31.0 26.0 - 34.0 pg   MCHC 32.6 30.0 - 36.0 g/dL   RDW 14.4 11.5 - 15.5 %   Platelets 144 (L) 150 - 400 K/uL   nRBC 0.0 0.0 - 0.2 %    DG Chest Port 1 View  Result Date: 12/18/2021 CLINICAL DATA:  Hypoxia EXAM: PORTABLE CHEST 1 VIEW COMPARISON:  12/12/2021 FINDINGS: Single frontal view of the chest demonstrates an unremarkable cardiac silhouette. No acute airspace disease, effusion, or pneumothorax. Chronic elevation of the left hemidiaphragm. No acute bony abnormalities. IMPRESSION: 1. No acute intrathoracic process. Electronically Signed   By: Randa Ngo M.D.   On: 12/18/2021 19:54    Assessment/Plan: 6 Days Post-Op   Principal Problem:   Closed displaced intertrochanteric fracture of right femur ([place]) Active Problems:   Hypoxia  Traumatic rhabdomyolysis ([place])   COVID-19 virus infection  Patient in 10 days of isolation for testing positive for COVID.  Continue physical therapy.  Patient pulling off dressings over right thigh.  No signs of infection around the incisions.  Will continue to monitor.    Thornton Park , MD 12/19/2021, 12:28 PM

## 2021-12-19 NOTE — Progress Notes (Signed)
Physical Therapy Treatment Patient Details Name: Morgan Stewart MRN: [id number] DOB: 1946/10/02 Today's Date: 12/19/2021   History of Present Illness Pt is a 76 y/o F admitted on 12/12/21 with c/c of an unwitnessed fall. Pt was found to have displaced R intertrochanteric hip fracture & underwent intramedullary fixation on 12/13/21 by Dr. Mack Guise. Pt also had subsequent acute rhabdomyolysis s/p fall. PMH: CAD, HTN, COPD, DJD, gout, dyslipidemia, vitamin D & B12 deficiencies, tobacco abuse    PT Comments    Pt was long sitting in bed with untouched breakfast tray in front of her. " I want some cola and some meat to eat." Pt is oriented to self only. She was however able to follow simple one step commands with increased time. Session included, pt getting OOB with min assist. She performed standing to RW 4 x and ambulation around room ~ 25 ft total. Pt's cognition is greatly limiting PT progress overall however was able perform more today than she has been able to thus far. PT continues to feel SNF is most appropriate at DC to maximize independence with ADLs.  Will need 24/7 care at DC mostly due to cognition.    Recommendations for follow up therapy are one component of a multi-disciplinary discharge planning process, led by the attending physician.  Recommendations may be updated based on patient status, additional functional criteria and insurance authorization.  Follow Up Recommendations  Skilled nursing-short term rehab (<3 hours/day)     Assistance Recommended at Discharge Frequent or constant Supervision/Assistance  Patient can return home with the following A lot of help with walking and/or transfers;A lot of help with bathing/dressing/bathroom;Assistance with cooking/housework;Assistance with feeding;Help with stairs or ramp for entrance;Assist for transportation;Direct supervision/assist for medications management;Direct supervision/assist for financial management   Equipment  Recommendations  Hospital bed;Wheelchair cushion (measurements PT);Wheelchair (measurements PT);BSC/3in1;Rolling walker (2 wheels)       Precautions / Restrictions Precautions Precautions: Fall Restrictions Weight Bearing Restrictions: Yes RLE Weight Bearing: Weight bearing as tolerated     Mobility  Bed Mobility Overal bed mobility: Needs Assistance Bed Mobility: Supine to Sit;Sit to Supine Rolling: Min assist   Supine to sit: Min assist;HOB elevated Sit to supine: Mod assist;HOB elevated   General bed mobility comments: Pt continues to require assistance to exit and re-enter bed    Transfers Overall transfer level: Needs assistance Equipment used: Rolling walker (2 wheels) Transfers: Sit to/from Stand Sit to Stand: Min assist           General transfer comment: pt stood from EOB 4 x throughout session. poor carry-over between trials however pt was able to perform with less assistance today.    Ambulation/Gait Ambulation/Gait assistance: Min assist Gait Distance (Feet): 25 Feet Assistive device: Rolling walker (2 wheels) Gait Pattern/deviations: Step-through pattern;Antalgic;Trunk flexed Gait velocity: decreased     General Gait Details: Pt was able to ambulate around room ~ 25 ft without min assist. Constant vcs for p to perform desired task requested     Balance Overall balance assessment: Needs assistance Sitting-balance support: Feet supported Sitting balance-Leahy Scale: Good Sitting balance - Comments: sat EOB without UE support x > 10 minutes during session. author assisted pt with eating a few bites of breakfast   Standing balance support: Bilateral upper extremity supported Standing balance-Leahy Scale: Fair Standing balance comment: reliant on RW during standing activity      Cognition Arousal/Alertness: Awake/alert Behavior During Therapy: Impulsive Overall Cognitive Status: No family/caregiver present to determine baseline cognitive  functioning  General Comments: Pt is orineted to self only. Author did best to re-orient to situation and setting. Pt did however follow simple commands with increased time to process.               Pertinent Vitals/Pain Pain Assessment: 0-10 Pain Score: 3  Faces Pain Scale: Hurts a little bit Pain Location: R hip Pain Descriptors / Indicators: Grimacing;Guarding Pain Intervention(s): Limited activity within patient's tolerance;Monitored during session;Premedicated before session;Repositioned     PT Goals (current goals can now be found in the care plan section) Acute Rehab PT Goals Patient Stated Goal: none stated Progress towards PT goals: Progressing toward goals    Frequency    7X/week      PT Plan Current plan remains appropriate       AM-PAC PT "6 Clicks" Mobility   Outcome Measure  Help needed turning from your back to your side while in a flat bed without using bedrails?: A Little Help needed moving from lying on your back to sitting on the side of a flat bed without using bedrails?: A Lot Help needed moving to and from a bed to a chair (including a wheelchair)?: A Lot Help needed standing up from a chair using your arms (e.g., wheelchair or bedside chair)?: A Lot Help needed to walk in hospital room?: A Lot Help needed climbing 3-5 steps with a railing? : A Lot 6 Click Score: 13    End of Session Equipment Utilized During Treatment: Gait belt Activity Tolerance: Patient tolerated treatment well Patient left: in bed;with call bell/phone within reach;with bed alarm set Nurse Communication: Mobility status;Precautions;Other (comment) PT Visit Diagnosis: Unsteadiness on feet (R26.81);Muscle weakness (generalized) (M62.81);Difficulty in walking, not elsewhere classified (R26.2);Pain Pain - Right/Left: Right Pain - part of body: Hip     Time: 1010-1030 PT Time Calculation (min) (ACUTE ONLY): 20 min  Charges:  $Gait Training: 8-22 mins                      Julaine Fusi PTA 12/19/21, 10:44 AM

## 2021-12-19 NOTE — Progress Notes (Signed)
PROGRESS NOTE  Morgan Stewart    DOB: 27-Mar-1946, 76 y.o.  OFB:[id number]  PCP: Lequita Asal, MD (Inactive)   Code Status: Full Code   DOA: 12/12/2021   LOS: 7  Brief Narrative of Current Hospitalization  Morgan Stewart is a 76 y.o. female with a PMH significant for CAD, HTN, COPD, DJD, gout, HLD, vitamin D deficiency, vitamin B12 deficiency, tobacco use dependence. They presented from home where she lives independently to the ED on 12/12/2021 with fall and right hip pain, unable to ambulate. In the ED, it was found that they had displaced right intertrochanteric hip fracture. They were treated with fixation on 12/29.  Patient tolerated well and was recovering with plans to discharge to SNF 1/3.  As part of SNF admission protocol, patient received COVID screening which was incidentally found to be positive which will delay her discharge for the 10-day isolation.  Acquired by the SNF. Patient was admitted to medicine service for further workup and management of right hip fracture as outlined in detail below.  12/19/21 -stable, ready for DC to SNF when bed available given 10 day COVID isolation protocol by SNF. Anticipated dc 1/13  Assessment & Plan  Principal Problem:   Closed right hip fracture ([place])  COVID-19 positive-incidental finding on screening test for SNF discharge.  Patient remains asymptomatic and stable on room air.  Chest x-ray negative - symptomatic treatment only at this time  Closed right hip fx secondary to presumed mechanical fall, POA Acute rhabdomyolysis- resolved Transient hypoxia secondary to narcotics, resolved Constipation secondary to narcotic use -Orthopedics following, status post ORIF 12/29 -analgesia PRN -PT/OT- discharge to SNF when bed available at end of covid isolation requirement  Essential hypertension, with notable postoperative hypotension- improved - monitor and restart home antihypertensives as needed  Hypokalemia-  - replete PRN -  BMP am  Dyslipidemia -continue statin Dementia -transient metabolic encephalopathy(POA) likely secondary to narcotics, back to baseline per family Depression continue Effexor, trazodone  Coronary artery disease.  Holding carvedilol and aspirin given hypotension and postoperative anticoagulation ongoing.  DVT prophylaxis: enoxaparin (LOVENOX) injection 30 mg Start: 12/14/21 0800 SCDs Start: 12/13/21 1832 Place TED hose Start: 12/13/21 1832 SCDs Start: 12/12/21 2318   Diet:  Diet Orders (From admission, onward)     Start     Ordered   12/13/21 1832  Diet regular Room service appropriate? Yes; Fluid consistency: Thin  Diet effective now       Question Answer Comment  Room service appropriate? Yes   Fluid consistency: Thin      12/13/21 1831            Subjective 12/19/21    Pt reports no complaints today. She denies current pain or respiratory concerns. Agreeable to waiting period until can be discharged to SNF.   Disposition Plan & Communication  Patient status: Inpatient  Admitted From: Home Disposition: Skilled nursing facility Anticipated discharge date: 1/13 after COVID isolation precautions have been met  Family Communication: none  Consults, Procedures, Significant Events  Consultants:  Ortho   Procedures/significant events:  R hip fixation 12/29 Covid positive 1/3  Antimicrobials:  Anti-infectives (From admission, onward)    Start     Dose/Rate Route Frequency Ordered Stop   12/13/21 1930  ceFAZolin (ANCEF) IVPB 2g/100 mL premix        2 g 200 mL/hr over 30 Minutes Intravenous Every 6 hours 12/13/21 1831 12/14/21 0234   12/13/21 1142  ceFAZolin (ANCEF) 2-4 GM/100ML-% IVPB  Note to Pharmacy: Jeanene Erb E: cabinet override      12/13/21 1142 12/13/21 2359   12/12/21 2106  ceFAZolin (ANCEF) IVPB 2g/100 mL premix  Status:  Discontinued        2 g 200 mL/hr over 30 Minutes Intravenous 30 min pre-op 12/12/21 2107 12/13/21 1837       Objective    Vitals:   12/18/21 0808 12/18/21 1612 12/18/21 1946 12/18/21 2334  BP: (!) 145/86 (!) 147/88 (!) 148/83 (!) 145/83  Pulse: (!) 101 80 100 (!) 101  Resp: _0 Temp: 98.1 F (36.7 C) 97.7 F (36.5 C) 99.2 F (37.3 C) 98.7 F (37.1 C)  TempSrc:      SpO2: (!) 86% 98%    Weight:      Height:       No intake or output data in the 24 hours ending 12/19/21 0705 Filed Weights   12/12/21 1420  Weight: 72 kg    Patient BMI: Body mass index is 25.62 kg/m.   Physical Exam:  General: awake, alert, NAD HEENT: atraumatic, clear conjunctiva, anicteric sclera, MMM, hearing grossly normal Respiratory: normal respiratory effort. Cardiovascular: normal S1/S2, RRR, no JVD, murmurs, quick capillary refill  Gastrointestinal: soft, NT, ND Nervous: A&O x3. no gross focal neurologic deficits, normal speech Extremities: moves all equally, no edema, normal tone Skin: dry, intact, normal temperature, normal color. No rashes, lesions or ulcers on exposed skin Psychiatry: normal mood, congruent affect  Labs   I have personally reviewed following labs and imaging studies Admission on 12/12/2021  Component Date Value Ref Range Status   Sodium 12/12/2021 144  135 - 145 mmol/L Final   Potassium 12/12/2021 3.8  3.5 - 5.1 mmol/L Final   Chloride 12/12/2021 107  98 - 111 mmol/L Final   CO2 12/12/2021 25  22 - 32 mmol/L Final   Glucose, Bld 12/12/2021 102 (H)  70 - 99 mg/dL Final   BUN 12/12/2021 37 (H)  8 - 23 mg/dL Final   Creatinine, Ser 12/12/2021 1.16 (H)  0.44 - 1.00 mg/dL Final   Calcium 12/12/2021 10.0  8.9 - 10.3 mg/dL Final   Total Protein 12/12/2021 6.6  6.5 - 8.1 g/dL Final   Albumin 12/12/2021 4.1  3.5 - 5.0 g/dL Final   AST 12/12/2021 82 (H)  15 - 41 U/L Final   ALT 12/12/2021 33  0 - 44 U/L Final   Alkaline Phosphatase 12/12/2021 69  38 - 126 U/L Final   Total Bilirubin 12/12/2021 1.7 (H)  0.3 - 1.2 mg/dL Final   GFR, Estimated 12/12/2021 49 (L)  >60 mL/min Final   Anion gap  12/12/2021 12  5 - 15 Final   WBC 12/12/2021 10.2  4.0 - 10.5 K/uL Final   RBC 12/12/2021 3.89  3.87 - 5.11 MIL/uL Final   Hemoglobin 12/12/2021 12.2  12.0 - 15.0 g/dL Final   HCT 12/12/2021 36.6  36.0 - 46.0 % Final   MCV 12/12/2021 94.1  80.0 - 100.0 fL Final   MCH 12/12/2021 31.4  26.0 - 34.0 pg Final   MCHC 12/12/2021 33.3  30.0 - 36.0 g/dL Final   RDW 12/12/2021 13.2  11.5 - 15.5 % Final   Platelets 12/12/2021 112 (L)  150 - 400 K/uL Final   nRBC 12/12/2021 0.0  0.0 - 0.2 % Final   Neutrophils Relative % 12/12/2021 82  % Final   Neutro Abs 12/12/2021 8.3 (H)  1.7 - 7.7 K/uL Final   Lymphocytes  Relative 12/12/2021 10  % Final   Lymphs Abs 12/12/2021 1.0  0.7 - 4.0 K/uL Final   Monocytes Relative 12/12/2021 8  % Final   Monocytes Absolute 12/12/2021 0.8  0.1 - 1.0 K/uL Final   Eosinophils Relative 12/12/2021 0  % Final   Eosinophils Absolute 12/12/2021 0.0  0.0 - 0.5 K/uL Final   Basophils Relative 12/12/2021 0  % Final   Basophils Absolute 12/12/2021 0.0  0.0 - 0.1 K/uL Final   WBC Morphology 12/12/2021 MORPHOLOGY UNREMARKABLE   Final   RBC Morphology 12/12/2021 MORPHOLOGY UNREMARKABLE   Final   Smear Review 12/12/2021 MORPHOLOGY UNREMARKABLE   Final   Immature Granulocytes 12/12/2021 0  % Final   Abs Immature Granulocytes 12/12/2021 0.04  0.00 - 0.07 K/uL Final   Total CK 12/12/2021 2,439 (H)  38 - 234 U/L Final   SARS Coronavirus 2 by RT PCR 12/12/2021 NEGATIVE  NEGATIVE Final   Influenza A by PCR 12/12/2021 NEGATIVE  NEGATIVE Final   Influenza B by PCR 12/12/2021 NEGATIVE  NEGATIVE Final   ABO/RH(D) 12/12/2021 O NEG   Final   Antibody Screen 12/12/2021 NEG   Final   Sample Expiration 12/12/2021    Final                   Value:12/15/2021,2359 Performed at [facility], [address] [place], [place] [address]    Sodium 12/13/2021 139  135 - 145 mmol/L Final   Potassium 12/13/2021 4.1  3.5 - 5.1 mmol/L Final   Chloride 12/13/2021 107  98 - 111 mmol/L Final    CO2 12/13/2021 26  22 - 32 mmol/L Final   Glucose, Bld 12/13/2021 108 (H)  70 - 99 mg/dL Final   BUN 12/13/2021 35 (H)  8 - 23 mg/dL Final   Creatinine, Ser 12/13/2021 1.12 (H)  0.44 - 1.00 mg/dL Final   Calcium 12/13/2021 9.3  8.9 - 10.3 mg/dL Final   GFR, Estimated 12/13/2021 51 (L)  >60 mL/min Final   Anion gap 12/13/2021 6  5 - 15 Final   WBC 12/13/2021 9.5  4.0 - 10.5 K/uL Final   RBC 12/13/2021 3.43 (L)  3.87 - 5.11 MIL/uL Final   Hemoglobin 12/13/2021 10.4 (L)  12.0 - 15.0 g/dL Final   HCT 12/13/2021 32.1 (L)  36.0 - 46.0 % Final   MCV 12/13/2021 93.6  80.0 - 100.0 fL Final   MCH 12/13/2021 30.3  26.0 - 34.0 pg Final   MCHC 12/13/2021 32.4  30.0 - 36.0 g/dL Final   RDW 12/13/2021 13.4  11.5 - 15.5 % Final   Platelets 12/13/2021 98 (L)  150 - 400 K/uL Final   nRBC 12/13/2021 0.0  0.0 - 0.2 % Final   Total CK 12/13/2021 1,582 (H)  38 - 234 U/L Final   Total CK 12/13/2021 980 (H)  38 - 234 U/L Final   WBC 12/14/2021 6.5  4.0 - 10.5 K/uL Final   RBC 12/14/2021 2.76 (L)  3.87 - 5.11 MIL/uL Final   Hemoglobin 12/14/2021 8.7 (L)  12.0 - 15.0 g/dL Final   HCT 12/14/2021 26.5 (L)  36.0 - 46.0 % Final   MCV 12/14/2021 96.0  80.0 - 100.0 fL Final   MCH 12/14/2021 31.5  26.0 - 34.0 pg Final   MCHC 12/14/2021 32.8  30.0 - 36.0 g/dL Final   RDW 12/14/2021 13.4  11.5 - 15.5 % Final   Platelets 12/14/2021 81 (L)  150 - 400 K/uL Final  nRBC 12/14/2021 0.0  0.0 - 0.2 % Final   Sodium 12/14/2021 142  135 - 145 mmol/L Final   Potassium 12/14/2021 4.0  3.5 - 5.1 mmol/L Final   Chloride 12/14/2021 110  98 - 111 mmol/L Final   CO2 12/14/2021 24  22 - 32 mmol/L Final   Glucose, Bld 12/14/2021 103 (H)  70 - 99 mg/dL Final   BUN 12/14/2021 34 (H)  8 - 23 mg/dL Final   Creatinine, Ser 12/14/2021 0.96  0.44 - 1.00 mg/dL Final   Calcium 12/14/2021 8.8 (L)  8.9 - 10.3 mg/dL Final   GFR, Estimated 12/14/2021 >60  >60 mL/min Final   Anion gap 12/14/2021 8  5 - 15 Final   WBC 12/16/2021 6.2  4.0 - 10.5  K/uL Final   RBC 12/16/2021 2.88 (L)  3.87 - 5.11 MIL/uL Final   Hemoglobin 12/16/2021 9.0 (L)  12.0 - 15.0 g/dL Final   HCT 12/16/2021 27.8 (L)  36.0 - 46.0 % Final   MCV 12/16/2021 96.5  80.0 - 100.0 fL Final   MCH 12/16/2021 31.3  26.0 - 34.0 pg Final   MCHC 12/16/2021 32.4  30.0 - 36.0 g/dL Final   RDW 12/16/2021 13.2  11.5 - 15.5 % Final   Platelets 12/16/2021 125 (L)  150 - 400 K/uL Final   nRBC 12/16/2021 0.0  0.0 - 0.2 % Final   Sodium 12/16/2021 139  135 - 145 mmol/L Final   Potassium 12/16/2021 3.5  3.5 - 5.1 mmol/L Final   Chloride 12/16/2021 108  98 - 111 mmol/L Final   CO2 12/16/2021 24  22 - 32 mmol/L Final   Glucose, Bld 12/16/2021 105 (H)  70 - 99 mg/dL Final   BUN 12/16/2021 30 (H)  8 - 23 mg/dL Final   Creatinine, Ser 12/16/2021 0.78  0.44 - 1.00 mg/dL Final   Calcium 12/16/2021 9.4  8.9 - 10.3 mg/dL Final   GFR, Estimated 12/16/2021 >60  >60 mL/min Final   Anion gap 12/16/2021 7  5 - 15 Final   Total CK 12/16/2021 796 (H)  38 - 234 U/L Final   WBC 12/18/2021 4.3  4.0 - 10.5 K/uL Final   RBC 12/18/2021 2.77 (L)  3.87 - 5.11 MIL/uL Final   Hemoglobin 12/18/2021 8.6 (L)  12.0 - 15.0 g/dL Final   HCT 12/18/2021 26.0 (L)  36.0 - 46.0 % Final   MCV 12/18/2021 93.9  80.0 - 100.0 fL Final   MCH 12/18/2021 31.0  26.0 - 34.0 pg Final   MCHC 12/18/2021 33.1  30.0 - 36.0 g/dL Final   RDW 12/18/2021 13.7  11.5 - 15.5 % Final   Platelets 12/18/2021 131 (L)  150 - 400 K/uL Final   nRBC 12/18/2021 0.0  0.0 - 0.2 % Final   Sodium 12/18/2021 140  135 - 145 mmol/L Final   Potassium 12/18/2021 3.3 (L)  3.5 - 5.1 mmol/L Final   Chloride 12/18/2021 108  98 - 111 mmol/L Final   CO2 12/18/2021 27  22 - 32 mmol/L Final   Glucose, Bld 12/18/2021 90  70 - 99 mg/dL Final   BUN 12/18/2021 19  8 - 23 mg/dL Final   Creatinine, Ser 12/18/2021 0.66  0.44 - 1.00 mg/dL Final   Calcium 12/18/2021 8.6 (L)  8.9 - 10.3 mg/dL Final   GFR, Estimated 12/18/2021 >60  >60 mL/min Final   Anion gap  12/18/2021 5  5 - 15 Final   Total CK 12/18/2021 344 (H)  38 - 234 U/L Final   SARS Coronavirus 2 by RT PCR 12/18/2021 POSITIVE (A)  NEGATIVE Final   Influenza A by PCR 12/18/2021 NEGATIVE  NEGATIVE Final   Influenza B by PCR 12/18/2021 NEGATIVE  NEGATIVE Final    Imaging Studies  DG Chest Port 1 View  Result Date: 12/18/2021 CLINICAL DATA:  Hypoxia EXAM: PORTABLE CHEST 1 VIEW COMPARISON:  12/12/2021 FINDINGS: Single frontal view of the chest demonstrates an unremarkable cardiac silhouette. No acute airspace disease, effusion, or pneumothorax. Chronic elevation of the left hemidiaphragm. No acute bony abnormalities. IMPRESSION: 1. No acute intrathoracic process. Electronically Signed   By: Randa Ngo M.D.   On: 12/18/2021 19:54    Medications   Scheduled Meds:  Chlorhexidine Gluconate Cloth  6 each Topical Daily   cholecalciferol  2,000 Units Oral Daily   docusate sodium  100 mg Oral BID   enoxaparin (LOVENOX) injection  30 mg Subcutaneous Q24H   ipratropium  2 spray Nasal TID   polyethylene glycol-electrolytes  4,000 mL Oral Once   rosuvastatin  40 mg Oral Daily   senna  1 tablet Oral BID   traMADol  50 mg Oral Q6H   No recently discontinued medications to reconcile  LOS: 7 days   Richarda Osmond, DO [facility] 12/19/2021, 7:05 AM   Available by Epic secure chat 7AM-7PM. If 7PM-7AM, please contact night-coverage Refer to [email or web address] to contact the [facility] Attending or Consulting provider for this pt

## 2021-12-20 ENCOUNTER — Inpatient Hospital Stay: Payer: Commercial Managed Care - PPO

## 2021-12-20 DIAGNOSIS — R0902 Hypoxemia: Secondary | ICD-10-CM | POA: Diagnosis not present

## 2021-12-20 DIAGNOSIS — T796XXA Traumatic ischemia of muscle, initial encounter: Secondary | ICD-10-CM | POA: Diagnosis not present

## 2021-12-20 DIAGNOSIS — S72141A Displaced intertrochanteric fracture of right femur, initial encounter for closed fracture: Secondary | ICD-10-CM | POA: Diagnosis not present

## 2021-12-20 DIAGNOSIS — U071 COVID-19: Secondary | ICD-10-CM | POA: Diagnosis not present

## 2021-12-20 LAB — CBC
HCT: 27.9 % — ABNORMAL LOW (ref 36.0–46.0)
Hemoglobin: 9.3 g/dL — ABNORMAL LOW (ref 12.0–15.0)
MCH: 32 pg (ref 26.0–34.0)
MCHC: 33.3 g/dL (ref 30.0–36.0)
MCV: 95.9 fL (ref 80.0–100.0)
Platelets: 136 10*3/uL — ABNORMAL LOW (ref 150–400)
RBC: 2.91 MIL/uL — ABNORMAL LOW (ref 3.87–5.11)
RDW: 14.5 % (ref 11.5–15.5)
WBC: 5.2 10*3/uL (ref 4.0–10.5)
nRBC: 0 % (ref 0.0–0.2)

## 2021-12-20 LAB — BASIC METABOLIC PANEL
Anion gap: 9 (ref 5–15)
BUN: 17 mg/dL (ref 8–23)
CO2: 28 mmol/L (ref 22–32)
Calcium: 8.7 mg/dL — ABNORMAL LOW (ref 8.9–10.3)
Chloride: 105 mmol/L (ref 98–111)
Creatinine, Ser: 0.76 mg/dL (ref 0.44–1.00)
GFR, Estimated: >60 mL/min (ref >60)
Glucose, Bld: 86 mg/dL (ref 70–99)
Potassium: 4.1 mmol/L (ref 3.5–5.1)
Sodium: 142 mmol/L (ref 135–145)

## 2021-12-20 NOTE — Progress Notes (Signed)
Occupational Therapy Treatment Patient Details Name: Morgan Stewart MRN: [id number] DOB: 03-24-1946 Today's Date: 12/20/2021   History of present illness Pt is a 76 y/o F admitted on 12/12/21 with c/c of an unwitnessed fall. Pt was found to have displaced R intertrochanteric hip fracture & underwent intramedullary fixation on 12/13/21 by Dr. Mack Guise. Pt also had subsequent acute rhabdomyolysis s/p fall. PMH: CAD, HTN, COPD, DJD, gout, dyslipidemia, vitamin D & B12 deficiencies, tobacco abuse   OT comments  Upon entering the room, pt supine in bed with TV on in room. Pt appears to be internally and externally distracted in the room and is very difficult to re-direction. Full breakfast tray in front of her and pt has not ate anything but reports being hungry. OT peeled banana and pt eating with cuing for redirection to bring to mouth. OT attempting OOB with pt she refused. She is also noted to be itching incision but RN is aware. Pt in bed with all needs within reach with increased confusion today and bed alarm activated for safety.   Recommendations for follow up therapy are one component of a multi-disciplinary discharge planning process, led by the attending physician.  Recommendations may be updated based on patient status, additional functional criteria and insurance authorization.    Follow Up Recommendations  Skilled nursing-short term rehab (<3 hours/day)    Assistance Recommended at Discharge Frequent or constant Supervision/Assistance  Patient can return home with the following  A lot of help with walking and/or transfers;A lot of help with bathing/dressing/bathroom   Equipment Recommendations  Other (comment) (defer to next venue of care)       Precautions / Restrictions Precautions Precautions: Fall Restrictions Weight Bearing Restrictions: Yes RLE Weight Bearing: Weight bearing as tolerated       Mobility Bed Mobility Overal bed mobility: Needs Assistance Bed  Mobility: Supine to Sit;Sit to Supine Rolling: Mod assist   Supine to sit: Mod assist;HOB elevated Sit to supine: Mod assist;HOB elevated        Transfers Overall transfer level: Needs assistance Equipment used: Rolling walker (2 wheels) Transfers: Sit to/from Stand Sit to Stand: Mod assist;Max assist           General transfer comment: pt's cognition greatly impacts session progression     Balance Overall balance assessment: Needs assistance Sitting-balance support: Feet supported Sitting balance-Leahy Scale: Good     Standing balance support: Bilateral upper extremity supported Standing balance-Leahy Scale: Fair Standing balance comment: reliant on RW during standing activity                           ADL either performed or assessed with clinical judgement   ADL Overall ADL's : Needs assistance/impaired Eating/Feeding: Minimal assistance;Supervision/ safety;Set up;Cueing for safety                                          Extremity/Trunk Assessment Upper Extremity Assessment Upper Extremity Assessment: Generalized weakness   Lower Extremity Assessment Lower Extremity Assessment: Generalized weakness   Cervical / Trunk Assessment Cervical / Trunk Assessment: Kyphotic    Vision Patient Visual Report: No change from baseline            Cognition Arousal/Alertness: Awake/alert Behavior During Therapy: Impulsive Overall Cognitive Status: History of cognitive impairments - at baseline  General Comments: Pt is pleasant and confused but unable to follow any commands                     Pertinent Vitals/ Pain       Pain Assessment: Faces Faces Pain Scale: Hurts a little bit Breathing: normal Negative Vocalization: none Facial Expression: smiling or inexpressive Body Language: relaxed Consolability: no need to console PAINAD Score: 0 Pain Location: R hip Pain Descriptors /  Indicators: Grimacing;Guarding Pain Intervention(s): Limited activity within patient's tolerance;Premedicated before session;Repositioned         Frequency  Min 2X/week        Progress Toward Goals  OT Goals(current goals can now be found in the care plan section)  Progress towards OT goals: Progressing toward goals  Acute Rehab OT Goals Patient Stated Goal: to get stronger OT Goal Formulation: With patient Time For Goal Achievement: 12/28/21 Potential to Achieve Goals: [place] Discharge plan remains appropriate;Frequency remains appropriate       AM-PAC OT "6 Clicks" Daily Activity     Outcome Measure   Help from another person eating meals?: A Little Help from another person taking care of personal grooming?: A Little Help from another person toileting, which includes using toliet, bedpan, or urinal?: Total Help from another person bathing (including washing, rinsing, drying)?: A Lot Help from another person to put on and taking off regular upper body clothing?: A Lot Help from another person to put on and taking off regular lower body clothing?: Total 6 Click Score: 12    End of Session    OT Visit Diagnosis: Unsteadiness on feet (R26.81);Repeated falls (R29.6);Muscle weakness (generalized) (M62.81);History of falling (Z91.81)   Activity Tolerance Patient limited by pain   Patient Left in bed;with call bell/phone within reach;with bed alarm set   Nurse Communication Mobility status        Time: 4128-7867 OT Time Calculation (min): 14 min  Charges: OT General Charges $OT Visit: 1 Visit OT Evaluation $OT Eval Moderate Complexity: 1 Mod OT Treatments $Self Care/Home Management : 8-22 mins  Darleen Crocker, MS, OTR/L , CBIS ascom [phone]  12/20/21, 3:46 PM

## 2021-12-20 NOTE — Progress Notes (Signed)
Physical Therapy Treatment Patient Details Name: Morgan Stewart MRN: [id number] DOB: 06-19-1946 Today's Date: 12/20/2021   History of Present Illness Pt is a 76 y/o F admitted on 12/12/21 with c/c of an unwitnessed fall. Pt was found to have displaced R intertrochanteric hip fracture & underwent intramedullary fixation on 12/13/21 by Dr. Mack Guise. Pt also had subsequent acute rhabdomyolysis s/p fall. PMH: CAD, HTN, COPD, DJD, gout, dyslipidemia, vitamin D & B12 deficiencies, tobacco abuse    PT Comments    Pt was long sitting in bed in the nude upon arriving. She is oriented to self only. Cognition greatly impacts session progression. She required more cueing and assistance throughout session to perform less activity. Constant vcs for desired task requested and redirecting for safety. She was unaware her bed was soak with urine however was willing to get OOB to [facility]. She was able to exit left side of bed, stand to RW, and take a few antalgic steps to [facility]. Pt was unwilling to attempt further ambulation this date and it would have been unsafe due to pt's limited abilities to follow commands consistently today. Acute PT to decrease frequency to 2 x a week due to cognition and limited progress made. Continued recommendations for SNF at DC however pt will need 24/7 assistance/supervision most likely for the foreseeable future. May need to transition to LTC after mobility improves from recent Fx.     Recommendations for follow up therapy are one component of a multi-disciplinary discharge planning process, led by the attending physician.  Recommendations may be updated based on patient status, additional functional criteria and insurance authorization.  Follow Up Recommendations  Skilled nursing-short term rehab (<3 hours/day)     Assistance Recommended at Discharge Frequent or constant Supervision/Assistance (24/7)  Patient can return home with the following A lot of help with walking and/or  transfers;A lot of help with bathing/dressing/bathroom;Assistance with cooking/housework;Assistance with feeding;Help with stairs or ramp for entrance;Assist for transportation;Direct supervision/assist for medications management;Direct supervision/assist for financial management   Equipment Recommendations  Hospital bed;Wheelchair cushion (measurements PT);Wheelchair (measurements PT);BSC/3in1;Rolling walker (2 wheels)       Precautions / Restrictions Precautions Precautions: Fall Restrictions Weight Bearing Restrictions: Yes RLE Weight Bearing: Weight bearing as tolerated     Mobility  Bed Mobility Overal bed mobility: Needs Assistance Bed Mobility: Supine to Sit;Sit to Supine Rolling: Mod assist   Supine to sit: Mod assist;HOB elevated Sit to supine: Mod assist;HOB elevated    Transfers Overall transfer level: Needs assistance Equipment used: Rolling walker (2 wheels) Transfers: Sit to/from Stand Sit to Stand: Mod assist;Max assist           General transfer comment: pt's cognition greatly impacts session progression    Ambulation/Gait Ambulation/Gait assistance: Mod assist Gait Distance (Feet): 3 Feet Assistive device: Rolling walker (2 wheels) Gait Pattern/deviations: Step-to pattern;Antalgic Gait velocity: decreased     General Gait Details: pt was able to take a few steps to [facility] then back to bed however severely limited this date versus yesterday's session due to cognition. pt was unable to follow safety cues throughout.     Balance Overall balance assessment: Needs assistance Sitting-balance support: Feet supported Sitting balance-Leahy Scale: Good     Standing balance support: Bilateral upper extremity supported Standing balance-Leahy Scale: Fair Standing balance comment: reliant on RW during standing activity       Cognition Arousal/Alertness: Awake/alert Behavior During Therapy: Impulsive Overall Cognitive Status: History of cognitive impairments  - at baseline      General  Comments: Pt is confused and disoriented. Was nude upon entering room and unaware she has had soaking wet bed from urine incontinence               Pertinent Vitals/Pain Breathing: normal Negative Vocalization: none Facial Expression: smiling or inexpressive Body Language: relaxed Consolability: no need to console PAINAD Score: 0 Pain Location: R hip Pain Descriptors / Indicators: Grimacing;Guarding Pain Intervention(s): Limited activity within patient's tolerance;Premedicated before session;Monitored during session;Repositioned     PT Goals (current goals can now be found in the care plan section) Acute Rehab PT Goals Patient Stated Goal: none stated Progress towards PT goals: Progressing toward goals    Frequency    Min 2X/week      PT Plan Frequency needs to be updated (decrease to 2 x a week due to cognition and lack of progress towards PT goals)       AM-PAC PT "6 Clicks" Mobility   Outcome Measure  Help needed turning from your back to your side while in a flat bed without using bedrails?: A Little Help needed moving from lying on your back to sitting on the side of a flat bed without using bedrails?: A Lot Help needed moving to and from a bed to a chair (including a wheelchair)?: A Lot Help needed standing up from a chair using your arms (e.g., wheelchair or bedside chair)?: A Lot Help needed to walk in hospital room?: A Lot Help needed climbing 3-5 steps with a railing? : A Lot 6 Click Score: 13    End of Session   Activity Tolerance: Patient tolerated treatment well Patient left: in bed;with call bell/phone within reach;with bed alarm set Nurse Communication: Mobility status;Precautions;Other (comment) PT Visit Diagnosis: Unsteadiness on feet (R26.81);Muscle weakness (generalized) (M62.81);Difficulty in walking, not elsewhere classified (R26.2);Pain Pain - Right/Left: Right Pain - part of body: Hip     Time:  0910-0939 PT Time Calculation (min) (ACUTE ONLY): 29 min  Charges:  $Therapeutic Activity: 23-37 mins                     Julaine Fusi PTA 12/20/21, 2:00 PM

## 2021-12-20 NOTE — Progress Notes (Signed)
PROGRESS NOTE  Morgan Stewart    DOB: [birth year], 76 y.o.  ION:[id number]  PCP: Lequita Asal, MD (Inactive)   Code Status: Full Code   DOA: 12/12/2021   LOS: 8  Brief Narrative of Current Hospitalization  Morgan Stewart is a 76 y.o. female with a PMH significant for CAD, HTN, COPD, DJD, gout, HLD, vitamin D deficiency, vitamin B12 deficiency, tobacco use dependence. They presented from home where she lives independently to the ED on 12/12/2021 with fall and right hip pain, unable to ambulate. In the ED, it was found that they had displaced right intertrochanteric hip fracture. They were treated with fixation on 12/29.  Patient tolerated well and was recovering with plans to discharge to SNF 1/3.  As part of SNF admission protocol, patient received COVID screening which was incidentally found to be positive which will delay her discharge for the 10-day isolation.  Acquired by the SNF. Patient was admitted to medicine service for further workup and management of right hip fracture as outlined in detail below.  12/20/21 -stable, ready for DC to SNF when bed available given 10 day COVID isolation protocol by SNF. Anticipated dc 1/13  Assessment & Plan  Principal Problem:   Closed displaced intertrochanteric fracture of right femur ([place]) Active Problems:   Hypoxia   Traumatic rhabdomyolysis ([place])   COVID-19 virus infection  COVID-19 positive-incidental finding on screening test for SNF discharge.  Patient remains asymptomatic and stable on room air.  Chest x-ray negative - symptomatic treatment only at this time  Closed right hip fx secondary to presumed mechanical fall, POA- denies pain.  Acute rhabdomyolysis- resolved Transient hypoxia secondary to narcotics, resolved Constipation secondary to narcotic use -Orthopedics following, status post ORIF 12/29 -analgesia PRN -PT/OT- discharge to SNF when bed available at end of covid isolation requirement - bowel  regimen  Essential hypertension, with notable postoperative hypotension- improved - monitor and restart home antihypertensives as needed  Hypokalemia-  - replete PRN - BMP am  Dyslipidemia -continue statin Dementia -transient metabolic encephalopathy(POA) likely secondary to narcotics, back to baseline per family Depression continue Effexor, trazodone  Coronary artery disease.  Holding carvedilol and aspirin given hypotension and postoperative anticoagulation ongoing.  DVT prophylaxis: enoxaparin (LOVENOX) injection 40 mg Start: 12/20/21 0800 SCDs Start: 12/13/21 1832 Place TED hose Start: 12/13/21 1832 SCDs Start: 12/12/21 2318   Diet:  Diet Orders (From admission, onward)     Start     Ordered   12/13/21 1832  Diet regular Room service appropriate? Yes; Fluid consistency: Thin  Diet effective now       Question Answer Comment  Room service appropriate? Yes   Fluid consistency: Thin      12/13/21 1831            Subjective 12/20/21    Pt reports being cold. Denies pain. I moved the thermostat setting higher in room.   Disposition Plan & Communication  Patient status: Inpatient  Admitted From: Home Disposition: Skilled nursing facility Anticipated discharge date: 1/13 after COVID isolation precautions have been met  Family Communication: none  Consults, Procedures, Significant Events  Consultants:  Ortho   Procedures/significant events:  R hip fixation 12/29 Covid positive 1/3  Antimicrobials:  Anti-infectives (From admission, onward)    Start     Dose/Rate Route Frequency Ordered Stop   12/13/21 1930  ceFAZolin (ANCEF) IVPB 2g/100 mL premix        2 g 200 mL/hr over 30 Minutes Intravenous Every 6 hours 12/13/21 1831 12/14/21  0234   12/13/21 1142  ceFAZolin (ANCEF) 2-4 GM/100ML-% IVPB       Note to Pharmacy: Jeanene Erb E: cabinet override      12/13/21 1142 12/13/21 2359   12/12/21 2106  ceFAZolin (ANCEF) IVPB 2g/100 mL premix  Status:   Discontinued        2 g 200 mL/hr over 30 Minutes Intravenous 30 min pre-op 12/12/21 2107 12/13/21 1837       Objective   Vitals:   12/19/21 1700 12/19/21 2022 12/19/21 2347 12/20/21 0428  BP: (!) 143/88 (!) 153/88 136/73 (!) 147/91  Pulse: 88 82 81 94  Resp: 16 20 15 16   Temp: 98.4 F (36.9 C) 99.2 F (37.3 C) 98.4 F (36.9 C) 98.2 F (36.8 C)  TempSrc: Oral     SpO2: 92% 92% 94%   Weight:      Height:        Intake/Output Summary (Last 24 hours) at 12/20/2021 0654 Last data filed at 12/19/2021 1000 Gross per 24 hour  Intake 2759.16 ml  Output --  Net 2759.16 ml   Filed Weights   12/12/21 1420  Weight: 72 kg    Patient BMI: Body mass index is 25.62 kg/m.   Physical Exam:  General: awake, alert, NAD HEENT: atraumatic, clear conjunctiva, anicteric sclera, MMM, hearing grossly normal Respiratory: normal respiratory effort. Cardiovascular: normal S1/S2, RRR, no JVD, murmurs, quick capillary refill  Nervous: A&O x2. no gross focal neurologic deficits, normal speech Extremities: moves all equally, no edema, normal tone Skin: dry, intact, normal temperature, normal color. No rashes, lesions or ulcers on exposed skin Psychiatry: normal mood, congruent affect  Labs   I have personally reviewed following labs and imaging studies Admission on 12/12/2021  Component Date Value Ref Range Status   Sodium 12/12/2021 144  135 - 145 mmol/L Final   Potassium 12/12/2021 3.8  3.5 - 5.1 mmol/L Final   Chloride 12/12/2021 107  98 - 111 mmol/L Final   CO2 12/12/2021 25  22 - 32 mmol/L Final   Glucose, Bld 12/12/2021 102 (H)  70 - 99 mg/dL Final   BUN 12/12/2021 37 (H)  8 - 23 mg/dL Final   Creatinine, Ser 12/12/2021 1.16 (H)  0.44 - 1.00 mg/dL Final   Calcium 12/12/2021 10.0  8.9 - 10.3 mg/dL Final   Total Protein 12/12/2021 6.6  6.5 - 8.1 g/dL Final   Albumin 12/12/2021 4.1  3.5 - 5.0 g/dL Final   AST 12/12/2021 82 (H)  15 - 41 U/L Final   ALT 12/12/2021 33  0 - 44 U/L Final    Alkaline Phosphatase 12/12/2021 69  38 - 126 U/L Final   Total Bilirubin 12/12/2021 1.7 (H)  0.3 - 1.2 mg/dL Final   GFR, Estimated 12/12/2021 49 (L)  >60 mL/min Final   Anion gap 12/12/2021 12  5 - 15 Final   WBC 12/12/2021 10.2  4.0 - 10.5 K/uL Final   RBC 12/12/2021 3.89  3.87 - 5.11 MIL/uL Final   Hemoglobin 12/12/2021 12.2  12.0 - 15.0 g/dL Final   HCT 12/12/2021 36.6  36.0 - 46.0 % Final   MCV 12/12/2021 94.1  80.0 - 100.0 fL Final   MCH 12/12/2021 31.4  26.0 - 34.0 pg Final   MCHC 12/12/2021 33.3  30.0 - 36.0 g/dL Final   RDW 12/12/2021 13.2  11.5 - 15.5 % Final   Platelets 12/12/2021 112 (L)  150 - 400 K/uL Final   nRBC 12/12/2021 0.0  0.0 -  0.2 % Final   Neutrophils Relative % 12/12/2021 82  % Final   Neutro Abs 12/12/2021 8.3 (H)  1.7 - 7.7 K/uL Final   Lymphocytes Relative 12/12/2021 10  % Final   Lymphs Abs 12/12/2021 1.0  0.7 - 4.0 K/uL Final   Monocytes Relative 12/12/2021 8  % Final   Monocytes Absolute 12/12/2021 0.8  0.1 - 1.0 K/uL Final   Eosinophils Relative 12/12/2021 0  % Final   Eosinophils Absolute 12/12/2021 0.0  0.0 - 0.5 K/uL Final   Basophils Relative 12/12/2021 0  % Final   Basophils Absolute 12/12/2021 0.0  0.0 - 0.1 K/uL Final   WBC Morphology 12/12/2021 MORPHOLOGY UNREMARKABLE   Final   RBC Morphology 12/12/2021 MORPHOLOGY UNREMARKABLE   Final   Smear Review 12/12/2021 MORPHOLOGY UNREMARKABLE   Final   Immature Granulocytes 12/12/2021 0  % Final   Abs Immature Granulocytes 12/12/2021 0.04  0.00 - 0.07 K/uL Final   Total CK 12/12/2021 2,439 (H)  38 - 234 U/L Final   SARS Coronavirus 2 by RT PCR 12/12/2021 NEGATIVE  NEGATIVE Final   Influenza A by PCR 12/12/2021 NEGATIVE  NEGATIVE Final   Influenza B by PCR 12/12/2021 NEGATIVE  NEGATIVE Final   ABO/RH(D) 12/12/2021 O NEG   Final   Antibody Screen 12/12/2021 NEG   Final   Sample Expiration 12/12/2021    Final                   Value:12/15/2021,2359 Performed at [facility], [address] [place], [place] [address]    Sodium 12/13/2021 139  135 - 145 mmol/L Final   Potassium 12/13/2021 4.1  3.5 - 5.1 mmol/L Final   Chloride 12/13/2021 107  98 - 111 mmol/L Final   CO2 12/13/2021 26  22 - 32 mmol/L Final   Glucose, Bld 12/13/2021 108 (H)  70 - 99 mg/dL Final   BUN 12/13/2021 35 (H)  8 - 23 mg/dL Final   Creatinine, Ser 12/13/2021 1.12 (H)  0.44 - 1.00 mg/dL Final   Calcium 12/13/2021 9.3  8.9 - 10.3 mg/dL Final   GFR, Estimated 12/13/2021 51 (L)  >60 mL/min Final   Anion gap 12/13/2021 6  5 - 15 Final   WBC 12/13/2021 9.5  4.0 - 10.5 K/uL Final   RBC 12/13/2021 3.43 (L)  3.87 - 5.11 MIL/uL Final   Hemoglobin 12/13/2021 10.4 (L)  12.0 - 15.0 g/dL Final   HCT 12/13/2021 32.1 (L)  36.0 - 46.0 % Final   MCV 12/13/2021 93.6  80.0 - 100.0 fL Final   MCH 12/13/2021 30.3  26.0 - 34.0 pg Final   MCHC 12/13/2021 32.4  30.0 - 36.0 g/dL Final   RDW 12/13/2021 13.4  11.5 - 15.5 % Final   Platelets 12/13/2021 98 (L)  150 - 400 K/uL Final   nRBC 12/13/2021 0.0  0.0 - 0.2 % Final   Total CK 12/13/2021 1,582 (H)  38 - 234 U/L Final   Total CK 12/13/2021 980 (H)  38 - 234 U/L Final   WBC 12/14/2021 6.5  4.0 - 10.5 K/uL Final   RBC 12/14/2021 2.76 (L)  3.87 - 5.11 MIL/uL Final   Hemoglobin 12/14/2021 8.7 (L)  12.0 - 15.0 g/dL Final   HCT 12/14/2021 26.5 (L)  36.0 - 46.0 % Final   MCV 12/14/2021 96.0  80.0 - 100.0 fL Final   MCH 12/14/2021 31.5  26.0 - 34.0 pg Final   MCHC 12/14/2021 32.8  30.0 - 36.0 g/dL Final   RDW 12/14/2021 13.4  11.5 - 15.5 % Final   Platelets 12/14/2021 81 (L)  150 - 400 K/uL Final   nRBC 12/14/2021 0.0  0.0 - 0.2 % Final   Sodium 12/14/2021 142  135 - 145 mmol/L Final   Potassium 12/14/2021 4.0  3.5 - 5.1 mmol/L Final   Chloride 12/14/2021 110  98 - 111 mmol/L Final   CO2 12/14/2021 24  22 - 32 mmol/L Final   Glucose, Bld 12/14/2021 103 (H)  70 - 99 mg/dL Final   BUN 12/14/2021 34 (H)  8 - 23 mg/dL Final   Creatinine, Ser 12/14/2021 0.96  0.44 - 1.00 mg/dL  Final   Calcium 12/14/2021 8.8 (L)  8.9 - 10.3 mg/dL Final   GFR, Estimated 12/14/2021 >60  >60 mL/min Final   Anion gap 12/14/2021 8  5 - 15 Final   WBC 12/16/2021 6.2  4.0 - 10.5 K/uL Final   RBC 12/16/2021 2.88 (L)  3.87 - 5.11 MIL/uL Final   Hemoglobin 12/16/2021 9.0 (L)  12.0 - 15.0 g/dL Final   HCT 12/16/2021 27.8 (L)  36.0 - 46.0 % Final   MCV 12/16/2021 96.5  80.0 - 100.0 fL Final   MCH 12/16/2021 31.3  26.0 - 34.0 pg Final   MCHC 12/16/2021 32.4  30.0 - 36.0 g/dL Final   RDW 12/16/2021 13.2  11.5 - 15.5 % Final   Platelets 12/16/2021 125 (L)  150 - 400 K/uL Final   nRBC 12/16/2021 0.0  0.0 - 0.2 % Final   Sodium 12/16/2021 139  135 - 145 mmol/L Final   Potassium 12/16/2021 3.5  3.5 - 5.1 mmol/L Final   Chloride 12/16/2021 108  98 - 111 mmol/L Final   CO2 12/16/2021 24  22 - 32 mmol/L Final   Glucose, Bld 12/16/2021 105 (H)  70 - 99 mg/dL Final   BUN 12/16/2021 30 (H)  8 - 23 mg/dL Final   Creatinine, Ser 12/16/2021 0.78  0.44 - 1.00 mg/dL Final   Calcium 12/16/2021 9.4  8.9 - 10.3 mg/dL Final   GFR, Estimated 12/16/2021 >60  >60 mL/min Final   Anion gap 12/16/2021 7  5 - 15 Final   Total CK 12/16/2021 796 (H)  38 - 234 U/L Final   WBC 12/18/2021 4.3  4.0 - 10.5 K/uL Final   RBC 12/18/2021 2.77 (L)  3.87 - 5.11 MIL/uL Final   Hemoglobin 12/18/2021 8.6 (L)  12.0 - 15.0 g/dL Final   HCT 12/18/2021 26.0 (L)  36.0 - 46.0 % Final   MCV 12/18/2021 93.9  80.0 - 100.0 fL Final   MCH 12/18/2021 31.0  26.0 - 34.0 pg Final   MCHC 12/18/2021 33.1  30.0 - 36.0 g/dL Final   RDW 12/18/2021 13.7  11.5 - 15.5 % Final   Platelets 12/18/2021 131 (L)  150 - 400 K/uL Final   nRBC 12/18/2021 0.0  0.0 - 0.2 % Final   Sodium 12/18/2021 140  135 - 145 mmol/L Final   Potassium 12/18/2021 3.3 (L)  3.5 - 5.1 mmol/L Final   Chloride 12/18/2021 108  98 - 111 mmol/L Final   CO2 12/18/2021 27  22 - 32 mmol/L Final   Glucose, Bld 12/18/2021 90  70 - 99 mg/dL Final   BUN 12/18/2021 19  8 - 23 mg/dL  Final   Creatinine, Ser 12/18/2021 0.66  0.44 - 1.00 mg/dL Final   Calcium 12/18/2021 8.6 (L)  8.9 - 10.3 mg/dL  Final   GFR, Estimated 12/18/2021 >60  >60 mL/min Final   Anion gap 12/18/2021 5  5 - 15 Final   Total CK 12/18/2021 344 (H)  38 - 234 U/L Final   SARS Coronavirus 2 by RT PCR 12/18/2021 POSITIVE (A)  NEGATIVE Final   Influenza A by PCR 12/18/2021 NEGATIVE  NEGATIVE Final   Influenza B by PCR 12/18/2021 NEGATIVE  NEGATIVE Final   Sodium 12/19/2021 140  135 - 145 mmol/L Final   Potassium 12/19/2021 3.2 (L)  3.5 - 5.1 mmol/L Final   Chloride 12/19/2021 104  98 - 111 mmol/L Final   CO2 12/19/2021 27  22 - 32 mmol/L Final   Glucose, Bld 12/19/2021 81  70 - 99 mg/dL Final   BUN 12/19/2021 20  8 - 23 mg/dL Final   Creatinine, Ser 12/19/2021 0.82  0.44 - 1.00 mg/dL Final   Calcium 12/19/2021 8.7 (L)  8.9 - 10.3 mg/dL Final   GFR, Estimated 12/19/2021 >60  >60 mL/min Final   Anion gap 12/19/2021 9  5 - 15 Final   WBC 12/19/2021 5.7  4.0 - 10.5 K/uL Final   RBC 12/19/2021 2.84 (L)  3.87 - 5.11 MIL/uL Final   Hemoglobin 12/19/2021 8.8 (L)  12.0 - 15.0 g/dL Final   HCT 12/19/2021 27.0 (L)  36.0 - 46.0 % Final   MCV 12/19/2021 95.1  80.0 - 100.0 fL Final   MCH 12/19/2021 31.0  26.0 - 34.0 pg Final   MCHC 12/19/2021 32.6  30.0 - 36.0 g/dL Final   RDW 12/19/2021 14.4  11.5 - 15.5 % Final   Platelets 12/19/2021 144 (L)  150 - 400 K/uL Final   nRBC 12/19/2021 0.0  0.0 - 0.2 % Final    Imaging Studies  DG Chest Port 1 View  Result Date: 12/18/2021 CLINICAL DATA:  Hypoxia EXAM: PORTABLE CHEST 1 VIEW COMPARISON:  12/12/2021 FINDINGS: Single frontal view of the chest demonstrates an unremarkable cardiac silhouette. No acute airspace disease, effusion, or pneumothorax. Chronic elevation of the left hemidiaphragm. No acute bony abnormalities. IMPRESSION: 1. No acute intrathoracic process. Electronically Signed   By: Randa Ngo M.D.   On: 12/18/2021 19:54    Medications   Scheduled Meds:   Chlorhexidine Gluconate Cloth  6 each Topical Daily   cholecalciferol  2,000 Units Oral Daily   enoxaparin (LOVENOX) injection  40 mg Subcutaneous Q24H   ipratropium  2 spray Nasal TID   polyethylene glycol-electrolytes  4,000 mL Oral Once   rosuvastatin  40 mg Oral Daily   senna  1 tablet Oral BID   traMADol  50 mg Oral Q6H   No recently discontinued medications to reconcile  LOS: 8 days   Richarda Osmond, DO [facility] 12/20/2021, 6:54 AM   Available by Epic secure chat 7AM-7PM. If 7PM-7AM, please contact night-coverage Refer to [email or web address] to contact the [facility] Attending or Consulting provider for this pt

## 2021-12-21 DIAGNOSIS — R0902 Hypoxemia: Secondary | ICD-10-CM | POA: Diagnosis not present

## 2021-12-21 DIAGNOSIS — T796XXA Traumatic ischemia of muscle, initial encounter: Secondary | ICD-10-CM | POA: Diagnosis not present

## 2021-12-21 DIAGNOSIS — U071 COVID-19: Secondary | ICD-10-CM | POA: Diagnosis not present

## 2021-12-21 DIAGNOSIS — S72141A Displaced intertrochanteric fracture of right femur, initial encounter for closed fracture: Secondary | ICD-10-CM | POA: Diagnosis not present

## 2021-12-21 MED ORDER — ASPIRIN EC 81 MG PO TBEC
81.0000 mg | DELAYED_RELEASE_TABLET | Freq: Every day | ORAL | Status: DC
  Administered 2021-12-21 – 2021-12-31 (×11): 81 mg via ORAL
  Filled 2021-12-21 (×11): qty 1

## 2021-12-21 NOTE — Progress Notes (Signed)
PROGRESS NOTE  Morgan Stewart    DOB: 1946-11-21, 76 y.o.  LKG:[id number]  PCP: Lequita Asal, MD (Inactive)   Code Status: Full Code   DOA: 12/12/2021   LOS: 9  Brief Narrative of Current Hospitalization  Morgan Stewart is a 76 y.o. female with a PMH significant for CAD, HTN, COPD, DJD, gout, HLD, vitamin D deficiency, vitamin B12 deficiency, tobacco use dependence. They presented from home where she lives independently to the ED on 12/12/2021 with fall and right hip pain, unable to ambulate. In the ED, it was found that they had displaced right intertrochanteric hip fracture. They were treated with fixation on 12/29.  Patient tolerated well and was recovering with plans to discharge to SNF 1/3.  As part of SNF admission protocol, patient received COVID screening which was incidentally found to be positive which will delay her discharge for the 10-day isolation.  Acquired by the SNF. Patient was admitted to medicine service for further workup and management of right hip fracture as outlined in detail below.  12/21/21 -stable, ready for DC to SNF when bed available given 10 day COVID isolation protocol by SNF. Anticipated dc 1/13  Assessment & Plan  Principal Problem:   Closed displaced intertrochanteric fracture of right femur ([place]) Active Problems:   Hypoxia   Traumatic rhabdomyolysis ([place])   COVID-19 virus infection  COVID-19 positive-incidental finding on screening test for SNF discharge.  Patient remains asymptomatic and stable on room air.  Chest x-ray negative - symptomatic treatment only at this time  Closed right hip fx secondary to presumed mechanical fall, POA- denies pain.  Acute rhabdomyolysis- resolved Transient hypoxia secondary to narcotics, resolved Constipation secondary to narcotic use -Orthopedics following, status post ORIF 12/29 -analgesia PRN -PT/OT- discharge to SNF when bed available at end of covid isolation requirement - bowel  regimen  Essential hypertension, with notable postoperative hypotension- improved. Patient has occasional elevated readings but is otherwise low normal so will continue to hold medications.  - monitor and restart home antihypertensives as needed  Hypokalemia- resolved - replete PRN  Dyslipidemia -continue statin Dementia -transient metabolic encephalopathy(POA) likely secondary to narcotics, back to baseline per family Depression continue Effexor, trazodone  Coronary artery disease.  Holding carvedilol given hypotension.  - restart ASA  DVT prophylaxis: enoxaparin (LOVENOX) injection 40 mg Start: 12/20/21 0800 SCDs Start: 12/13/21 1832 Place TED hose Start: 12/13/21 1832 SCDs Start: 12/12/21 2318   Diet:  Diet Orders (From admission, onward)     Start     Ordered   12/13/21 1832  Diet regular Room service appropriate? Yes; Fluid consistency: Thin  Diet effective now       Question Answer Comment  Room service appropriate? Yes   Fluid consistency: Thin      12/13/21 1831            Subjective 12/21/21    Pt reports no complaints today. She is restful.  Disposition Plan & Communication  Patient status: Inpatient  Admitted From: Home Disposition: Skilled nursing facility Anticipated discharge date: 1/13 after COVID isolation precautions have been met  Family Communication: none  Consults, Procedures, Significant Events  Consultants:  Ortho   Procedures/significant events:  R hip fixation 12/29 Covid positive 1/3  Antimicrobials:  Anti-infectives (From admission, onward)    Start     Dose/Rate Route Frequency Ordered Stop   12/13/21 1930  ceFAZolin (ANCEF) IVPB 2g/100 mL premix        2 g 200 mL/hr over 30 Minutes  Intravenous Every 6 hours 12/13/21 1831 12/14/21 0234   12/13/21 1142  ceFAZolin (ANCEF) 2-4 GM/100ML-% IVPB       Note to Pharmacy: Jeanene Erb E: cabinet override      12/13/21 1142 12/13/21 2359   12/12/21 2106  ceFAZolin (ANCEF) IVPB  2g/100 mL premix  Status:  Discontinued        2 g 200 mL/hr over 30 Minutes Intravenous 30 min pre-op 12/12/21 2107 12/13/21 1837       Objective   Vitals:   12/20/21 1615 12/20/21 2028 12/21/21 0358 12/21/21 0607  BP: (!) 142/91 97/60 116/82 (!) 149/82  Pulse: 78 76 65 71  Resp: _0 Temp: 98.9 F (37.2 C) 98.5 F (36.9 C) 98.2 F (36.8 C) 98.2 F (36.8 C)  TempSrc:      SpO2: 96%  100%   Weight:      Height:       No intake or output data in the 24 hours ending 12/21/21 0654  Filed Weights   12/12/21 1420  Weight: 72 kg    Patient BMI: Body mass index is 25.62 kg/m.   Physical Exam:  General: asleep, NAD HEENT: dry mucous membranes Respiratory: normal respiratory effort. Cardiovascular: normal S1/S2, RRR, no JVD, murmurs, quick capillary refill  Extremities: no edema, normal tone Skin: dry, intact, normal temperature, normal color. No rashes, lesions or ulcers on exposed skin  Labs   I have personally reviewed following labs and imaging studies Admission on 12/12/2021  Component Date Value Ref Range Status   Sodium 12/12/2021 144  135 - 145 mmol/L Final   Potassium 12/12/2021 3.8  3.5 - 5.1 mmol/L Final   Chloride 12/12/2021 107  98 - 111 mmol/L Final   CO2 12/12/2021 25  22 - 32 mmol/L Final   Glucose, Bld 12/12/2021 102 (H)  70 - 99 mg/dL Final   BUN 12/12/2021 37 (H)  8 - 23 mg/dL Final   Creatinine, Ser 12/12/2021 1.16 (H)  0.44 - 1.00 mg/dL Final   Calcium 12/12/2021 10.0  8.9 - 10.3 mg/dL Final   Total Protein 12/12/2021 6.6  6.5 - 8.1 g/dL Final   Albumin 12/12/2021 4.1  3.5 - 5.0 g/dL Final   AST 12/12/2021 82 (H)  15 - 41 U/L Final   ALT 12/12/2021 33  0 - 44 U/L Final   Alkaline Phosphatase 12/12/2021 69  38 - 126 U/L Final   Total Bilirubin 12/12/2021 1.7 (H)  0.3 - 1.2 mg/dL Final   GFR, Estimated 12/12/2021 49 (L)  >60 mL/min Final   Anion gap 12/12/2021 12  5 - 15 Final   WBC 12/12/2021 10.2  4.0 - 10.5 K/uL Final   RBC 12/12/2021  3.89  3.87 - 5.11 MIL/uL Final   Hemoglobin 12/12/2021 12.2  12.0 - 15.0 g/dL Final   HCT 12/12/2021 36.6  36.0 - 46.0 % Final   MCV 12/12/2021 94.1  80.0 - 100.0 fL Final   MCH 12/12/2021 31.4  26.0 - 34.0 pg Final   MCHC 12/12/2021 33.3  30.0 - 36.0 g/dL Final   RDW 12/12/2021 13.2  11.5 - 15.5 % Final   Platelets 12/12/2021 112 (L)  150 - 400 K/uL Final   nRBC 12/12/2021 0.0  0.0 - 0.2 % Final   Neutrophils Relative % 12/12/2021 82  % Final   Neutro Abs 12/12/2021 8.3 (H)  1.7 - 7.7 K/uL Final   Lymphocytes Relative 12/12/2021 10  % Final   Lymphs  Abs 12/12/2021 1.0  0.7 - 4.0 K/uL Final   Monocytes Relative 12/12/2021 8  % Final   Monocytes Absolute 12/12/2021 0.8  0.1 - 1.0 K/uL Final   Eosinophils Relative 12/12/2021 0  % Final   Eosinophils Absolute 12/12/2021 0.0  0.0 - 0.5 K/uL Final   Basophils Relative 12/12/2021 0  % Final   Basophils Absolute 12/12/2021 0.0  0.0 - 0.1 K/uL Final   WBC Morphology 12/12/2021 MORPHOLOGY UNREMARKABLE   Final   RBC Morphology 12/12/2021 MORPHOLOGY UNREMARKABLE   Final   Smear Review 12/12/2021 MORPHOLOGY UNREMARKABLE   Final   Immature Granulocytes 12/12/2021 0  % Final   Abs Immature Granulocytes 12/12/2021 0.04  0.00 - 0.07 K/uL Final   Total CK 12/12/2021 2,439 (H)  38 - 234 U/L Final   SARS Coronavirus 2 by RT PCR 12/12/2021 NEGATIVE  NEGATIVE Final   Influenza A by PCR 12/12/2021 NEGATIVE  NEGATIVE Final   Influenza B by PCR 12/12/2021 NEGATIVE  NEGATIVE Final   ABO/RH(D) 12/12/2021 O NEG   Final   Antibody Screen 12/12/2021 NEG   Final   Sample Expiration 12/12/2021    Final                   Value:12/15/2021,2359 Performed at [facility], [address] [place], [place] [address]    Sodium 12/13/2021 139  135 - 145 mmol/L Final   Potassium 12/13/2021 4.1  3.5 - 5.1 mmol/L Final   Chloride 12/13/2021 107  98 - 111 mmol/L Final   CO2 12/13/2021 26  22 - 32 mmol/L Final   Glucose, Bld 12/13/2021 108 (H)  70 - 99 mg/dL  Final   BUN 12/13/2021 35 (H)  8 - 23 mg/dL Final   Creatinine, Ser 12/13/2021 1.12 (H)  0.44 - 1.00 mg/dL Final   Calcium 12/13/2021 9.3  8.9 - 10.3 mg/dL Final   GFR, Estimated 12/13/2021 51 (L)  >60 mL/min Final   Anion gap 12/13/2021 6  5 - 15 Final   WBC 12/13/2021 9.5  4.0 - 10.5 K/uL Final   RBC 12/13/2021 3.43 (L)  3.87 - 5.11 MIL/uL Final   Hemoglobin 12/13/2021 10.4 (L)  12.0 - 15.0 g/dL Final   HCT 12/13/2021 32.1 (L)  36.0 - 46.0 % Final   MCV 12/13/2021 93.6  80.0 - 100.0 fL Final   MCH 12/13/2021 30.3  26.0 - 34.0 pg Final   MCHC 12/13/2021 32.4  30.0 - 36.0 g/dL Final   RDW 12/13/2021 13.4  11.5 - 15.5 % Final   Platelets 12/13/2021 98 (L)  150 - 400 K/uL Final   nRBC 12/13/2021 0.0  0.0 - 0.2 % Final   Total CK 12/13/2021 1,582 (H)  38 - 234 U/L Final   Total CK 12/13/2021 980 (H)  38 - 234 U/L Final   WBC 12/14/2021 6.5  4.0 - 10.5 K/uL Final   RBC 12/14/2021 2.76 (L)  3.87 - 5.11 MIL/uL Final   Hemoglobin 12/14/2021 8.7 (L)  12.0 - 15.0 g/dL Final   HCT 12/14/2021 26.5 (L)  36.0 - 46.0 % Final   MCV 12/14/2021 96.0  80.0 - 100.0 fL Final   MCH 12/14/2021 31.5  26.0 - 34.0 pg Final   MCHC 12/14/2021 32.8  30.0 - 36.0 g/dL Final   RDW 12/14/2021 13.4  11.5 - 15.5 % Final   Platelets 12/14/2021 81 (L)  150 - 400 K/uL Final   nRBC 12/14/2021 0.0  0.0 - 0.2 %  Final   Sodium 12/14/2021 142  135 - 145 mmol/L Final   Potassium 12/14/2021 4.0  3.5 - 5.1 mmol/L Final   Chloride 12/14/2021 110  98 - 111 mmol/L Final   CO2 12/14/2021 24  22 - 32 mmol/L Final   Glucose, Bld 12/14/2021 103 (H)  70 - 99 mg/dL Final   BUN 12/14/2021 34 (H)  8 - 23 mg/dL Final   Creatinine, Ser 12/14/2021 0.96  0.44 - 1.00 mg/dL Final   Calcium 12/14/2021 8.8 (L)  8.9 - 10.3 mg/dL Final   GFR, Estimated 12/14/2021 >60  >60 mL/min Final   Anion gap 12/14/2021 8  5 - 15 Final   WBC 12/16/2021 6.2  4.0 - 10.5 K/uL Final   RBC 12/16/2021 2.88 (L)  3.87 - 5.11 MIL/uL Final   Hemoglobin 12/16/2021  9.0 (L)  12.0 - 15.0 g/dL Final   HCT 12/16/2021 27.8 (L)  36.0 - 46.0 % Final   MCV 12/16/2021 96.5  80.0 - 100.0 fL Final   MCH 12/16/2021 31.3  26.0 - 34.0 pg Final   MCHC 12/16/2021 32.4  30.0 - 36.0 g/dL Final   RDW 12/16/2021 13.2  11.5 - 15.5 % Final   Platelets 12/16/2021 125 (L)  150 - 400 K/uL Final   nRBC 12/16/2021 0.0  0.0 - 0.2 % Final   Sodium 12/16/2021 139  135 - 145 mmol/L Final   Potassium 12/16/2021 3.5  3.5 - 5.1 mmol/L Final   Chloride 12/16/2021 108  98 - 111 mmol/L Final   CO2 12/16/2021 24  22 - 32 mmol/L Final   Glucose, Bld 12/16/2021 105 (H)  70 - 99 mg/dL Final   BUN 12/16/2021 30 (H)  8 - 23 mg/dL Final   Creatinine, Ser 12/16/2021 0.78  0.44 - 1.00 mg/dL Final   Calcium 12/16/2021 9.4  8.9 - 10.3 mg/dL Final   GFR, Estimated 12/16/2021 >60  >60 mL/min Final   Anion gap 12/16/2021 7  5 - 15 Final   Total CK 12/16/2021 796 (H)  38 - 234 U/L Final   WBC 12/18/2021 4.3  4.0 - 10.5 K/uL Final   RBC 12/18/2021 2.77 (L)  3.87 - 5.11 MIL/uL Final   Hemoglobin 12/18/2021 8.6 (L)  12.0 - 15.0 g/dL Final   HCT 12/18/2021 26.0 (L)  36.0 - 46.0 % Final   MCV 12/18/2021 93.9  80.0 - 100.0 fL Final   MCH 12/18/2021 31.0  26.0 - 34.0 pg Final   MCHC 12/18/2021 33.1  30.0 - 36.0 g/dL Final   RDW 12/18/2021 13.7  11.5 - 15.5 % Final   Platelets 12/18/2021 131 (L)  150 - 400 K/uL Final   nRBC 12/18/2021 0.0  0.0 - 0.2 % Final   Sodium 12/18/2021 140  135 - 145 mmol/L Final   Potassium 12/18/2021 3.3 (L)  3.5 - 5.1 mmol/L Final   Chloride 12/18/2021 108  98 - 111 mmol/L Final   CO2 12/18/2021 27  22 - 32 mmol/L Final   Glucose, Bld 12/18/2021 90  70 - 99 mg/dL Final   BUN 12/18/2021 19  8 - 23 mg/dL Final   Creatinine, Ser 12/18/2021 0.66  0.44 - 1.00 mg/dL Final   Calcium 12/18/2021 8.6 (L)  8.9 - 10.3 mg/dL Final   GFR, Estimated 12/18/2021 >60  >60 mL/min Final   Anion gap 12/18/2021 5  5 - 15 Final   Total CK 12/18/2021 344 (H)  38 - 234 U/L Final   SARS  Coronavirus 2 by RT PCR 12/18/2021 POSITIVE (A)  NEGATIVE Final   Influenza A by PCR 12/18/2021 NEGATIVE  NEGATIVE Final   Influenza B by PCR 12/18/2021 NEGATIVE  NEGATIVE Final   Sodium 12/19/2021 140  135 - 145 mmol/L Final   Potassium 12/19/2021 3.2 (L)  3.5 - 5.1 mmol/L Final   Chloride 12/19/2021 104  98 - 111 mmol/L Final   CO2 12/19/2021 27  22 - 32 mmol/L Final   Glucose, Bld 12/19/2021 81  70 - 99 mg/dL Final   BUN 12/19/2021 20  8 - 23 mg/dL Final   Creatinine, Ser 12/19/2021 0.82  0.44 - 1.00 mg/dL Final   Calcium 12/19/2021 8.7 (L)  8.9 - 10.3 mg/dL Final   GFR, Estimated 12/19/2021 >60  >60 mL/min Final   Anion gap 12/19/2021 9  5 - 15 Final   WBC 12/19/2021 5.7  4.0 - 10.5 K/uL Final   RBC 12/19/2021 2.84 (L)  3.87 - 5.11 MIL/uL Final   Hemoglobin 12/19/2021 8.8 (L)  12.0 - 15.0 g/dL Final   HCT 12/19/2021 27.0 (L)  36.0 - 46.0 % Final   MCV 12/19/2021 95.1  80.0 - 100.0 fL Final   MCH 12/19/2021 31.0  26.0 - 34.0 pg Final   MCHC 12/19/2021 32.6  30.0 - 36.0 g/dL Final   RDW 12/19/2021 14.4  11.5 - 15.5 % Final   Platelets 12/19/2021 144 (L)  150 - 400 K/uL Final   nRBC 12/19/2021 0.0  0.0 - 0.2 % Final   WBC 12/20/2021 5.2  4.0 - 10.5 K/uL Final   RBC 12/20/2021 2.91 (L)  3.87 - 5.11 MIL/uL Final   Hemoglobin 12/20/2021 9.3 (L)  12.0 - 15.0 g/dL Final   HCT 12/20/2021 27.9 (L)  36.0 - 46.0 % Final   MCV 12/20/2021 95.9  80.0 - 100.0 fL Final   MCH 12/20/2021 32.0  26.0 - 34.0 pg Final   MCHC 12/20/2021 33.3  30.0 - 36.0 g/dL Final   RDW 12/20/2021 14.5  11.5 - 15.5 % Final   Platelets 12/20/2021 136 (L)  150 - 400 K/uL Final   nRBC 12/20/2021 0.0  0.0 - 0.2 % Final   Sodium 12/20/2021 142  135 - 145 mmol/L Final   Potassium 12/20/2021 4.1  3.5 - 5.1 mmol/L Final   Chloride 12/20/2021 105  98 - 111 mmol/L Final   CO2 12/20/2021 28  22 - 32 mmol/L Final   Glucose, Bld 12/20/2021 86  70 - 99 mg/dL Final   BUN 12/20/2021 17  8 - 23 mg/dL Final   Creatinine, Ser  12/20/2021 0.76  0.44 - 1.00 mg/dL Final   Calcium 12/20/2021 8.7 (L)  8.9 - 10.3 mg/dL Final   GFR, Estimated 12/20/2021 >60  >60 mL/min Final   Anion gap 12/20/2021 9  5 - 15 Final    Imaging Studies  No results found.  Medications   Scheduled Meds:  Chlorhexidine Gluconate Cloth  6 each Topical Daily   cholecalciferol  2,000 Units Oral Daily   enoxaparin (LOVENOX) injection  40 mg Subcutaneous Q24H   ipratropium  2 spray Nasal TID   polyethylene glycol-electrolytes  4,000 mL Oral Once   rosuvastatin  40 mg Oral Daily   senna  1 tablet Oral BID   traMADol  50 mg Oral Q6H   No recently discontinued medications to reconcile  LOS: 9 days   Richarda Osmond, DO [facility] 12/21/2021, 6:54 AM   Available by Epic secure chat  7AM-7PM. If 7PM-7AM, please contact night-coverage Refer to [email or web address] to contact the [facility] Attending or Consulting provider for this pt

## 2021-12-22 DIAGNOSIS — N1831 Chronic kidney disease, stage 3a: Secondary | ICD-10-CM | POA: Diagnosis not present

## 2021-12-22 DIAGNOSIS — S72141A Displaced intertrochanteric fracture of right femur, initial encounter for closed fracture: Secondary | ICD-10-CM | POA: Diagnosis not present

## 2021-12-22 DIAGNOSIS — R410 Disorientation, unspecified: Secondary | ICD-10-CM

## 2021-12-22 DIAGNOSIS — J449 Chronic obstructive pulmonary disease, unspecified: Secondary | ICD-10-CM

## 2021-12-22 DIAGNOSIS — I2510 Atherosclerotic heart disease of native coronary artery without angina pectoris: Secondary | ICD-10-CM

## 2021-12-22 MED ORDER — CEFAZOLIN SODIUM-DEXTROSE 2-4 GM/100ML-% IV SOLN
2.0000 g | Freq: Three times a day (TID) | INTRAVENOUS | Status: DC
  Administered 2021-12-23 (×2): 2 g via INTRAVENOUS
  Filled 2021-12-22 (×2): qty 100

## 2021-12-22 MED ORDER — QUETIAPINE FUMARATE 25 MG PO TABS
25.0000 mg | ORAL_TABLET | Freq: Every day | ORAL | Status: DC
  Administered 2021-12-22 – 2021-12-28 (×7): 25 mg via ORAL
  Filled 2021-12-22 (×7): qty 1

## 2021-12-22 NOTE — Assessment & Plan Note (Signed)
Cr at baseline 

## 2021-12-22 NOTE — Assessment & Plan Note (Addendum)
S/p PCI 2004 -Continue aspirin, Crestor

## 2021-12-22 NOTE — Hospital Course (Signed)
Mrs. Pinkney is a 76 y.o. F with Coronary artery disease, hypertension, COPD, DJD, gout, dyslipidemia, vitamin D deficiency, vitamin B12 deficiency and tobacco abuse, presented to the ER with right hip pain after a fall.  She stated that she could not get up after falling -unwitnessed event as she lives independently at home.    In the ED imaging remarkable for right femur fracture. Patient tolerated right hip fracture repair on 32/44/0102, uncomplicated. Was to be discharged 1/3 but incidentally tested positive.  Now awaiting discharge to SNF when isolation ends 1/13.

## 2021-12-22 NOTE — Assessment & Plan Note (Signed)
-   Start low dose Seroquel Delirium precautions:   -Lights and TV off, minimize interruptions at night  -Blinds open and lights on during day  -Glasses/hearing aid with patient  -Frequent reorientation  -PT/OT when able  -Avoid sedation medications/Beers list medications

## 2021-12-22 NOTE — Assessment & Plan Note (Signed)
No active flare. °

## 2021-12-22 NOTE — Progress Notes (Signed)
°  Subjective:  POD #9 s/p intramedullary fixation for right intertrochanteric hip fracture.   Patient is confused and has a sitter in the room.  She is awake and able to follow commands.  Patient reports no significant right hip pain.  Patient is in a 10-day quarantine for COVID before she can be discharged to a skilled nursing facility.  Objective:   VITALS:   Vitals:   12/21/21 1654 12/21/21 2038 12/22/21 0743 12/22/21 1623  BP: 121/85 135/87 (!) 118/100 139/84  Pulse: 71 77 79 76  Resp: 18 17 17 17   Temp: (!) 97.5 F (36.4 C) 98.3 F (36.8 C) 97.7 F (36.5 C) 97.7 F (36.5 C)  TempSrc:   Axillary   SpO2: 91% 100%  91%  Weight:      Height:        PHYSICAL EXAM: Right lower extremity: Patient's proximal incision has surrounding erythema but no drainage.  There is no fluctuance.  The distal incisions are without swelling or erythema.  There is no ecchymosis.  Her thigh compartments are soft and compressible.  Patient has peeled off her dressings consistently due to her dementia. Neurovascular intact Sensation intact distally Intact pulses distally Dorsiflexion/Plantar flexion intact Incision: no drainage No cellulitis present Compartment soft  LABS  No results found for this or any previous visit (from the past 24 hour(s)).  No results found.  Assessment/Plan: 9 Days Post-Op   Principal Problem:   Closed displaced intertrochanteric fracture of right femur ([place]) Active Problems:   Chronic kidney disease (CKD) stage G3a/A1, moderately decreased glomerular filtration rate (GFR) between 45-59 mL/min/1.73 square meter and albuminuria creatinine ratio less than 30 mg/g (HCC)   COPD (chronic obstructive pulmonary disease) (HCC)   Coronary artery disease involving native coronary artery of native heart   COVID-19 virus infection   Delirium  Patient with right proximal incision erythema possibly early cellulitis versus foreign body reaction to the staples.  Staples must  remain in place until postop day 14.  I am going to order Ancef to treat possible early cellulitis.  Patient's last white count was 5.2 and she has not had a fever.  Continue physical therapy as the patient can tolerate.  Continue Lovenox for DVT prophylaxis.    Thornton Park , MD 12/22/2021, 5:24 PM

## 2021-12-22 NOTE — Assessment & Plan Note (Signed)
Asymptomatic. 

## 2021-12-22 NOTE — Assessment & Plan Note (Signed)
Incision red today. No fever.  - Consult Orthopedics -Continue Lovenox for DVT PPx at discharge

## 2021-12-22 NOTE — Progress Notes (Addendum)
°  Progress Note   Patient: Morgan Stewart OVF:[id number] DOB: 05/24/1946 DOA: 12/12/2021     10 DOS: the patient was seen and examined on 12/22/2021      Brief hospital course: Morgan Stewart is a 76 y.o. F with Coronary artery disease, hypertension, COPD, DJD, gout, dyslipidemia, vitamin D deficiency, vitamin B12 deficiency and tobacco abuse, presented to the ER with right hip pain after a fall.  She stated that she could not get up after falling -unwitnessed event as she lives independently at home.    In the ED imaging remarkable for right femur fracture. Patient tolerated right hip fracture repair on 84/16/6063, uncomplicated. Was to be discharged 1/3 but incidentally tested positive.  Now awaiting discharge to SNF when isolation ends 1/13.         Assessment and Plan * Closed displaced intertrochanteric fracture of right femur ([place])- (present on admission) Incision red today. No fever.  - Consult Orthopedics -Continue Lovenox for DVT PPx at discharge   Delirium - Start low dose Seroquel Delirium precautions:   -Lights and TV off, minimize interruptions at night  -Blinds open and lights on during day  -Glasses/hearing aid with patient  -Frequent reorientation  -PT/OT when able  -Avoid sedation medications/Beers list medications    COVID-19 virus infection Asymptomatic  Coronary artery disease involving native coronary artery of native heart- (present on admission) S/p PCI 2004 -Continue aspirin, Crestor  Chronic kidney disease (CKD) stage G3a/A1, moderately decreased glomerular filtration rate (GFR) between 45-59 mL/min/1.73 square meter and albuminuria creatinine ratio less than 30 mg/g (HCC)- (present on admission) Cr at baseline  COPD (chronic obstructive pulmonary disease) ([place])- (present on admission) No active flare.         Subjective: No fever.  She remains confused and somewhat restless.  Nursing report that she is complaining of increased right  hip pain today, and there is some redness overlying her incision without dehiscence or drainage.  No vomiting.  No other changes.  Objective Vital signs were reviewed and unremarkable. General appearance: Elderly adult female, lying in bed, mostly inattentive     HEENT:    Skin: There is no dehiscence, but there is some mild surrounding redness of the right hip incision Cardiac: RRR, no murmurs, no peripheral edema Respiratory: Normal respiratory rate and rhythm, lungs clear without rales or wheezes Abdomen:   MSK:  Neuro: Awake, moves upper extremities with mild tremor, generalized weakness but symmetric strength.  Leg strength on testing.  She does not follow commands consistently.  Hard of hearing Psych: Attention diminished, affect blunted, does not follow commands consistently    Data Reviewed: CBC is remarkable for mild anemia, complete metabolic panel is normal for transaminitis, mild, uncontrolled change from previous.  Ferritin elevated consistent with COVID.   Discussed with Dr. Mack Guise     Family Communication: niece by phone     Disposition: Status is: Inpatient  Remains inpatient appropriate because: she will require SNF and this must wait for COVID isolation to end on 1/12, d/c 1/13         Author: Edwin Dada, MD 12/22/2021 2:57 PM  For on call review [email or web address].

## 2021-12-23 DIAGNOSIS — J449 Chronic obstructive pulmonary disease, unspecified: Secondary | ICD-10-CM | POA: Diagnosis not present

## 2021-12-23 DIAGNOSIS — N1831 Chronic kidney disease, stage 3a: Secondary | ICD-10-CM | POA: Diagnosis not present

## 2021-12-23 DIAGNOSIS — S72141A Displaced intertrochanteric fracture of right femur, initial encounter for closed fracture: Secondary | ICD-10-CM | POA: Diagnosis not present

## 2021-12-23 DIAGNOSIS — T8149XA Infection following a procedure, other surgical site, initial encounter: Secondary | ICD-10-CM

## 2021-12-23 DIAGNOSIS — I2510 Atherosclerotic heart disease of native coronary artery without angina pectoris: Secondary | ICD-10-CM | POA: Diagnosis not present

## 2021-12-23 LAB — CBC
HCT: 29.8 % — ABNORMAL LOW (ref 36.0–46.0)
Hemoglobin: 9.6 g/dL — ABNORMAL LOW (ref 12.0–15.0)
MCH: 30.5 pg (ref 26.0–34.0)
MCHC: 32.2 g/dL (ref 30.0–36.0)
MCV: 94.6 fL (ref 80.0–100.0)
Platelets: 156 10*3/uL (ref 150–400)
RBC: 3.15 MIL/uL — ABNORMAL LOW (ref 3.87–5.11)
RDW: 14.8 % (ref 11.5–15.5)
WBC: 7.5 10*3/uL (ref 4.0–10.5)
nRBC: 0 % (ref 0.0–0.2)

## 2021-12-23 MED ORDER — SODIUM CHLORIDE 0.9 % IV SOLN: INTRAVENOUS | Status: DC | PRN

## 2021-12-23 MED ORDER — CEPHALEXIN 500 MG PO CAPS
500.0000 mg | ORAL_CAPSULE | Freq: Four times a day (QID) | ORAL | Status: DC
  Administered 2021-12-23 – 2021-12-29 (×22): 500 mg via ORAL
  Filled 2021-12-23 (×22): qty 1

## 2021-12-23 MED ORDER — NICOTINE 7 MG/24HR TD PT24
7.0000 mg | MEDICATED_PATCH | Freq: Every day | TRANSDERMAL | Status: DC
  Administered 2021-12-23 – 2021-12-31 (×9): 7 mg via TRANSDERMAL
  Filled 2021-12-23 (×9): qty 1

## 2021-12-23 NOTE — Assessment & Plan Note (Signed)
S/p PCI 2004 -Continue aspirin, Crestor

## 2021-12-23 NOTE — Assessment & Plan Note (Signed)
Cr at baseline 

## 2021-12-23 NOTE — Progress Notes (Signed)
°  Subjective:  POD #10 s/p intramedullary fixation for right intertrochanteric hip fracture.  Patient remains hospitalized for isolation due to a recent COVID-positive test.  Patient awaiting skilled nursing facility placement.  Patient has a sitter at the bedside and has a history of dementia.  She is unable to provide an accurate history but denies any right hip pain.  Objective:   VITALS:   Vitals:   12/23/21 0319 12/23/21 0737 12/23/21 1259 12/23/21 1655  BP: 126/69 (!) 137/53 118/74 124/86  Pulse: 68 89 73 76  Resp: 18 18 20 20   Temp: 98.1 F (36.7 C) 98.3 F (36.8 C) 98.5 F (36.9 C) 98.5 F (36.9 C)  TempSrc:      SpO2: 99% 99% 94% 95%  Weight:      Height:        PHYSICAL EXAM: Right lower extremity: Patient has remove the honeycomb dressings replaced last night.  The erythema around her proximal incision appears improved following my order for Ancef. Neurovascular intact Sensation intact distally Intact pulses distally Dorsiflexion/Plantar flexion intact Incision: dressing C/D/I No cellulitis present Compartment soft  LABS  Results for orders placed or performed during the hospital encounter of 12/12/21 (from the past 24 hour(s))  CBC     Status: Abnormal   Collection Time: 12/23/21  5:17 AM  Result Value Ref Range   WBC 7.5 4.0 - 10.5 K/uL   RBC 3.15 (L) 3.87 - 5.11 MIL/uL   Hemoglobin 9.6 (L) 12.0 - 15.0 g/dL   HCT 29.8 (L) 36.0 - 46.0 %   MCV 94.6 80.0 - 100.0 fL   MCH 30.5 26.0 - 34.0 pg   MCHC 32.2 30.0 - 36.0 g/dL   RDW 14.8 11.5 - 15.5 %   Platelets 156 150 - 400 K/uL   nRBC 0.0 0.0 - 0.2 %    No results found.  Assessment/Plan: 10 Days Post-Op   Principal Problem:   Closed displaced intertrochanteric fracture of right femur ([place]) Active Problems:   Chronic kidney disease (CKD) stage G3a/A1, moderately decreased glomerular filtration rate (GFR) between 45-59 mL/min/1.73 square meter and albuminuria creatinine ratio less than 30 mg/g (HCC)    COPD (chronic obstructive pulmonary disease) (HCC)   Coronary artery disease involving native coronary artery of native heart   COVID-19 virus infection   Delirium   Superficial postoperative wound infection  Patient is stable postop.  Continue physical therapy as the patient can tolerate.  Continue Lovenox for DVT prophylaxis.  Awaiting skilled nursing facility placement following 10-day quarantine.  Erythema around proximal incision improving on Ancef.  Will continue to follow.    Thornton Park , MD 12/23/2021, 7:30 PM

## 2021-12-23 NOTE — Assessment & Plan Note (Addendum)
Nursing report she is still restles and impulsive and tries to get up to pee periodically  - Continue low dose Seroquel QHS, started Saturday - Continue delirium precautions  - If still impulsive tomorrow, will plan to increase Seroquel

## 2021-12-23 NOTE — Assessment & Plan Note (Signed)
Asymptomatic. 

## 2021-12-23 NOTE — Assessment & Plan Note (Signed)
Noted to have new redness around wound on 1/7.  Ortho saw today, recommended empiric ancef, may just be foreign body reaction, but staples have to stay in until day 14 - Continue Ancef - Remove staples on 1/13

## 2021-12-23 NOTE — Assessment & Plan Note (Addendum)
°-   Consult Orthopedics -Continue Lovenox for DVT PPx at discharge

## 2021-12-23 NOTE — Assessment & Plan Note (Signed)
No active flare. °

## 2021-12-23 NOTE — Progress Notes (Signed)
°  Progress Note   Patient: Morgan Stewart OLM:[id number] DOB: 03-18-1946 DOA: 12/12/2021     11 DOS: the patient was seen and examined on 12/23/2021      Brief hospital course: Morgan Stewart is a 76 y.o. F with Coronary artery disease, hypertension, COPD, DJD, gout, dyslipidemia, vitamin D deficiency, vitamin B12 deficiency and tobacco abuse, presented to the ER with right hip pain after a fall.  She stated that she could not get up after falling -unwitnessed event as she lives independently at home.    In the ED imaging remarkable for right femur fracture. Patient tolerated right hip fracture repair on 01/00/7121, uncomplicated. Was to be discharged 1/3 but incidentally tested positive.  Now awaiting discharge to SNF when isolation ends 1/13.         Assessment and Plan * Closed displaced intertrochanteric fracture of right femur ([place])- (present on admission)  - Consult Orthopedics -Continue Lovenox for DVT PPx at discharge   Delirium Nursing report she is still restles and impulsive and tries to get up to pee periodically  - Continue low dose Seroquel QHS, started Saturday - Continue delirium precautions  - If still impulsive tomorrow, will plan to increase Seroquel   Superficial postoperative wound infection Noted to have new redness around wound on 1/7.  Ortho saw today, recommended empiric ancef, may just be foreign body reaction, but staples have to stay in until day 14 - Continue Ancef - Remove staples on 1/13  COVID-19 virus infection Asymptomatic  Coronary artery disease involving native coronary artery of native heart- (present on admission) S/p PCI 2004 -Continue aspirin, Crestor  Chronic kidney disease (CKD) stage G3a/A1, moderately decreased glomerular filtration rate (GFR) between 45-59 mL/min/1.73 square meter and albuminuria creatinine ratio less than 30 mg/g (HCC)- (present on admission) Cr at baseline  COPD (chronic obstructive pulmonary disease)  ([place])- (present on admission) No active flare.         Subjective: No new fever.  She remains at her baseline mentation, and restless and impulsive per nursing.  No increase in pain, no vomiting, no respiratory distress.  Objective Vital signs were reviewed and unremarkable. General appearance: Elderly adult female, lying in bed, mostly inattentive     HEENT:    Skin: There is no dehiscence, but there is some mild surrounding redness of the right hip incision Cardiac: RRR, no murmurs, no peripheral edema Respiratory: Normal respiratory rate and rhythm, lungs clear without rales or wheezes Abdomen:   MSK:  Neuro: Awake, moves upper extremities with mild tremor, generalized weakness but symmetric strength.  Leg strength on testing.  She does not follow commands consistently.  Hard of hearing Psych: Attention diminished, affect blunted, does not follow commands consistently    Data Reviewed: Labs to review     Family Communication: niece by phone     Disposition: Status is: Inpatient  Remains inpatient appropriate because: she will require SNF and this must wait for COVID isolation to end on 1/12, d/c 1/13       Author: Edwin Dada, MD 12/23/2021 2:04 PM  For on call review [email or web address].

## 2021-12-24 DIAGNOSIS — T8149XA Infection following a procedure, other surgical site, initial encounter: Secondary | ICD-10-CM

## 2021-12-24 DIAGNOSIS — T796XXS Traumatic ischemia of muscle, sequela: Secondary | ICD-10-CM

## 2021-12-24 DIAGNOSIS — N182 Chronic kidney disease, stage 2 (mild): Secondary | ICD-10-CM

## 2021-12-24 DIAGNOSIS — S72001S Fracture of unspecified part of neck of right femur, sequela: Secondary | ICD-10-CM

## 2021-12-24 DIAGNOSIS — F03918 Unspecified dementia, unspecified severity, with other behavioral disturbance: Secondary | ICD-10-CM

## 2021-12-24 DIAGNOSIS — U071 COVID-19: Secondary | ICD-10-CM | POA: Diagnosis not present

## 2021-12-24 DIAGNOSIS — R410 Disorientation, unspecified: Secondary | ICD-10-CM | POA: Diagnosis not present

## 2021-12-24 MED ORDER — HALOPERIDOL LACTATE 5 MG/ML IJ SOLN: 1.0000 mg | Freq: Four times a day (QID) | INTRAMUSCULAR | Status: DC | PRN

## 2021-12-24 MED ORDER — MELATONIN 5 MG PO TABS
5.0000 mg | ORAL_TABLET | Freq: Every day | ORAL | Status: DC
  Administered 2021-12-24 – 2021-12-30 (×7): 5 mg via ORAL
  Filled 2021-12-24 (×7): qty 1

## 2021-12-24 MED ORDER — SODIUM CHLORIDE 0.9 % IV BOLUS
500.0000 mL | Freq: Once | INTRAVENOUS | Status: AC
  Administered 2021-12-24: 500 mL via INTRAVENOUS

## 2021-12-24 MED ORDER — TRAZODONE HCL 100 MG PO TABS: 100.0000 mg | ORAL_TABLET | Freq: Every day | ORAL | Status: DC

## 2021-12-24 MED ORDER — ACETAMINOPHEN 650 MG RE SUPP: 650.0000 mg | Freq: Four times a day (QID) | RECTAL | Status: DC | PRN

## 2021-12-24 MED ORDER — HYDROCODONE-ACETAMINOPHEN 5-325 MG PO TABS
1.0000 | ORAL_TABLET | Freq: Four times a day (QID) | ORAL | Status: DC | PRN
  Administered 2021-12-25 – 2021-12-30 (×7): 1 via ORAL
  Filled 2021-12-24 (×8): qty 1

## 2021-12-24 MED ORDER — ACETAMINOPHEN 325 MG PO TABS
650.0000 mg | ORAL_TABLET | Freq: Four times a day (QID) | ORAL | Status: DC | PRN
  Administered 2021-12-26: 650 mg via ORAL
  Filled 2021-12-24: qty 2

## 2021-12-24 NOTE — Plan of Care (Signed)
  Problem: Health Behavior/Discharge Planning: Goal: Ability to manage health-related needs will improve Outcome: Progressing   

## 2021-12-24 NOTE — Progress Notes (Signed)
Patient ID: Morgan Stewart, female   DOB: 31-Dec-1945, 76 y.o.   MRN: [id number] [facility] PROGRESS NOTE  Morgan Stewart HKV:[id number] DOB: [birth year] DOA: 12/12/2021 PCP: Lequita Asal, MD (Inactive)  HPI/Subjective: Called by nursing staff that the patient has not been sleeping very well and has been impulsive.  There is a Actuary in the room.  Patient tested positive for COVID on 12/18/2020.  Initially came in with fall and a hip fracture.  Objective: Vitals:   12/24/21 1112 12/24/21 1217  BP: (!) 88/69 95/60  Pulse: 89 76  Resp: 14   Temp: (!) 97.5 F (36.4 C)   SpO2: 96%     Intake/Output Summary (Last 24 hours) at 12/24/2021 1226 Last data filed at 12/24/2021 0500 Gross per 24 hour  Intake 385.37 ml  Output --  Net 385.37 ml   Filed Weights   12/12/21 1420  Weight: 72 kg    ROS: Review of Systems  Respiratory:  Negative for shortness of breath.   Cardiovascular:  Negative for chest pain.  Gastrointestinal:  Negative for abdominal pain, nausea and vomiting.  Exam: Physical Exam HENT:     Head: Normocephalic.     Mouth/Throat:     Pharynx: No oropharyngeal exudate.  Eyes:     General: Lids are normal.     Conjunctiva/sclera: Conjunctivae normal.  Cardiovascular:     Rate and Rhythm: Normal rate and regular rhythm.     Heart sounds: Normal heart sounds, S1 normal and S2 normal.  Pulmonary:     Breath sounds: No decreased breath sounds, wheezing, rhonchi or rales.  Abdominal:     Palpations: Abdomen is soft.     Tenderness: There is no abdominal tenderness.  Musculoskeletal:     Right lower leg: No swelling.     Left lower leg: No swelling.  Skin:    General: Skin is warm.     Findings: No rash.  Neurological:     Mental Status: She is alert.     Comments: Answer some simple yes or no questions.      Scheduled Meds:  aspirin EC  81 mg Oral Daily   cephALEXin  500 mg Oral Q6H   Chlorhexidine Gluconate Cloth  6 each Topical Daily    cholecalciferol  2,000 Units Oral Daily   enoxaparin (LOVENOX) injection  40 mg Subcutaneous Q24H   ipratropium  2 spray Nasal TID   nicotine  7 mg Transdermal Daily   polyethylene glycol-electrolytes  4,000 mL Oral Once   QUEtiapine  25 mg Oral QHS   rosuvastatin  40 mg Oral Daily   senna  1 tablet Oral BID   traZODone  100 mg Oral QHS   Continuous Infusions:  sodium chloride 10 mL/hr at 12/23/21 0417   methocarbamol (ROBAXIN) IV      Assessment/Plan:  Acute delirium.  In speaking with the family they state that trazodone causes worsening mental status.  I will get rid of the trazodone.  Patient already on Seroquel.  Will give standing dose melatonin.  Continue to monitor. Superficial postoperative wound infection on p.o. Keflex.  Morgan Stewart will come out on the 13th. COVID-19 infection will be in isolation through the 13th and likely out to rehab on the 14th. Dementia with behavioral disturbance.  As needed Haldol.  Seroquel at night. COPD.  Respiratory status stable. CKD stage II.  Creatinine 0.6 with a GFR greater than 60. Rhabdomyolysis from fall.  CPK 2439 and improved down to 344.  We will  get rid of Crestor for now. Right hip fracture requiring operative repair.  Patient had intramedullary fixation of right intertrochanteric hip fracture on 12/13/2021 by Dr. Mack Guise.  Convert to Tylenol.      Code Status:     Code Status Orders  (From admission, onward)           Start     Ordered   12/12/21 2318  Full code  Continuous        12/13/21 0036           Code Status History     This patient has a current code status but no historical code status.      Family Communication: Updated patient's niece on the phone Disposition Plan: Status is: [place]  [facility]

## 2021-12-24 NOTE — Progress Notes (Signed)
Physical Therapy Treatment Patient Details Name: Morgan Stewart MRN: [id number] DOB: 1946/03/04 Today's Date: 12/24/2021   History of Present Illness Pt is a 76 y/o F admitted on 12/12/21 with c/c of an unwitnessed fall. Pt was found to have displaced R intertrochanteric hip fracture & underwent intramedullary fixation on 12/13/21 by Dr. Mack Guise. Pt also had subsequent acute rhabdomyolysis s/p fall. PMH: CAD, HTN, COPD, DJD, gout, dyslipidemia, vitamin D & B12 deficiencies, tobacco abuse    PT Comments    Pt received upright in bed with sitter present. Agreeable to PT. Pleasant but remains confused with very hard difficulty following commands without max multimodal cuing with heavy reliance on hand over hand facilitation. When asked to don sock onto L foot, pt donning socks onto hands. Able to perform LE therex with minimal pain and assist, but does rely on facilitation mentioned above. Ultimately able to transfer to EOB with modA and HOB elevated with cuing for LE/UE sequencing.  Attempt amde to stand with 1 person assist relying on MaxA with inability to attain standing. However with two person assist, ot able to stand witth very minA to possibly only minguard. Believe difficulty to stand with first attempt versus second is due to cognition as bed height was the same and no other alterations were made to make standing easier. Able to safely transfer to recliner with minguard of 2 with RW sequencing and max VC's for LE sequencing. Once seated, pt quick to close eyes and reports fatigue. Further mobility deferred. D/c recs remain appropriate at this time due to deficits in poor safety awareness, ;poor use of AD, and limited cognition placing pt at increased risk of falls and re-injury.    Recommendations for follow up therapy are one component of a multi-disciplinary discharge planning process, led by the attending physician.  Recommendations may be updated based on patient status, additional  functional criteria and insurance authorization.  Follow Up Recommendations  Skilled nursing-short term rehab (<3 hours/day)     Assistance Recommended at Discharge Frequent or constant Supervision/Assistance  Patient can return home with the following A lot of help with walking and/or transfers;A lot of help with bathing/dressing/bathroom;Assistance with cooking/housework;Assistance with feeding;Help with stairs or ramp for entrance;Assist for transportation;Direct supervision/assist for medications management;Direct supervision/assist for financial management   Equipment Recommendations  Hospital bed;Wheelchair cushion (measurements PT);Wheelchair (measurements PT);BSC/3in1;Rolling walker (2 wheels)    Recommendations for Other Services       Precautions / Restrictions Precautions Precautions: Fall Restrictions Weight Bearing Restrictions: Yes RLE Weight Bearing: Weight bearing as tolerated     Mobility  Bed Mobility Overal bed mobility: Needs Assistance Bed Mobility: Supine to Sit     Supine to sit: Mod assist;HOB elevated     General bed mobility comments: Requiring hand oaver hand cuing and max VC's for sequencing, modA at torso to attain sitting EOB. Patient Response: Cooperative;Impulsive  Transfers Overall transfer level: Needs assistance Equipment used: Rolling walker (2 wheels) Transfers: Sit to/from Stand Sit to Stand: Max assist;From elevated surface Stand pivot transfers: From elevated surface;+2 physical assistance;Min guard         General transfer comment: With 1 person, pt unable to attain standing with maxA. Anticiapte due to cognition and difficulty following commands. Attempts with +2 assist but pt attained standing with minguard to possible very minA to RW. Hand over hand cuing and max VC's for LE and RW sequencing.    Ambulation/Gait     Assistive device: Rolling walker (2 wheels) Gait Pattern/deviations: Step-to pattern;Antalgic  Stairs             Wheelchair Mobility    Modified Rankin (Stroke Patients Only)       Balance Overall balance assessment: Needs assistance Sitting-balance support: Feet supported Sitting balance-Leahy Scale: Good     Standing balance support: Bilateral upper extremity supported;Reliant on assistive device for balance Standing balance-Leahy Scale: Fair Standing balance comment: reliant on RW during standing activity                            Cognition Arousal/Alertness: Awake/alert Behavior During Therapy: Impulsive;Flat affect Overall Cognitive Status: History of cognitive impairments - at baseline                                 General Comments: Pleasant and cooperative. Requires max multimodal cuing to follow commands for safe participation.        Exercises Total Joint Exercises Ankle Circles/Pumps: AROM;Both;10 reps;Supine Short Arc Quad: AROM;Seated;Right;10 reps Heel Slides: AAROM;Strengthening;Right;10 reps;Supine Hip ABduction/ADduction: AROM;Strengthening;Right;10 reps Long Arc Quad: AROM;Seated;Both;5 reps    General Comments        Pertinent Vitals/Pain Pain Assessment: Faces Faces Pain Scale: Hurts a little bit Pain Location: R hip Pain Descriptors / Indicators: Grimacing;Guarding Pain Intervention(s): Limited activity within patient's tolerance;Monitored during session;Repositioned    Home Living                          Prior Function            PT Goals (current goals can now be found in the care plan section) Acute Rehab PT Goals Patient Stated Goal: none stated    Frequency    Min 2X/week      PT Plan Current plan remains appropriate    Co-evaluation              AM-PAC PT "6 Clicks" Mobility   Outcome Measure  Help needed turning from your back to your side while in a flat bed without using bedrails?: A Lot Help needed moving from lying on your back to sitting on the  side of a flat bed without using bedrails?: A Lot Help needed moving to and from a bed to a chair (including a wheelchair)?: A Lot Help needed standing up from a chair using your arms (e.g., wheelchair or bedside chair)?: A Lot Help needed to walk in hospital room?: A Lot Help needed climbing 3-5 steps with a railing? : A Lot 6 Click Score: 12    End of Session Equipment Utilized During Treatment: Gait belt Activity Tolerance: Patient tolerated treatment well Patient left: in chair;with call bell/phone within reach;with chair alarm set;with nursing/sitter in room Nurse Communication: Mobility status PT Visit Diagnosis: Unsteadiness on feet (R26.81);Muscle weakness (generalized) (M62.81);Difficulty in walking, not elsewhere classified (R26.2);Pain Pain - Right/Left: Right Pain - part of body: Hip     Time: 0762-2633 PT Time Calculation (min) (ACUTE ONLY): 23 min  Charges:  $Therapeutic Exercise: 8-22 mins $Therapeutic Activity: 8-22 mins                    Zykeria Laguardia M. Fairly IV, PT, DPT Physical Therapist- [facility]  12/24/2021, 11:17 AM

## 2021-12-24 NOTE — Progress Notes (Signed)
Occupational Therapy Treatment Patient Details Name: Morgan Stewart MRN: [id number] DOB: 09-Dec-1946 Today's Date: 12/24/2021   History of present illness Pt is a 76 y/o F admitted on 12/12/21 with c/c of an unwitnessed fall. Pt was found to have displaced R intertrochanteric hip fracture & underwent intramedullary fixation on 12/13/21 by Dr. Mack Guise. Pt also had subsequent acute rhabdomyolysis s/p fall. PMH: CAD, HTN, COPD, DJD, gout, dyslipidemia, vitamin D & B12 deficiencies, tobacco abuse   OT comments  Ms Sherman was seen for OT treatment on this date. Upon arrival to room pt reclined in bed, lunch at bedside untouched. Pt requires SETUP + SBA self-feeding sitting EOB - cues for initation and safety as pt is impulsive/disoriented. MOD A for LB access seated EOB. MIN A + rail and HHA sit<>stand. becomes fearful in standing requiring MIN A to maintain static standing for periaccess. SBA + cues for lateral scoot along EOB. Pt making good progress toward goals. Pt continues to benefit from skilled OT services to maximize return to PLOF and minimize risk of future falls, injury, caregiver burden, and readmission. Will continue to follow POC. Discharge recommendation remains appropriate.     Recommendations for follow up therapy are one component of a multi-disciplinary discharge planning process, led by the attending physician.  Recommendations may be updated based on patient status, additional functional criteria and insurance authorization.    Follow Up Recommendations  Skilled nursing-short term rehab (<3 hours/day)    Assistance Recommended at Discharge Frequent or constant Supervision/Assistance  Patient can return home with the following  A lot of help with walking and/or transfers;A lot of help with bathing/dressing/bathroom;Help with stairs or ramp for entrance   Equipment Recommendations  Other (comment) (defer)    Recommendations for Other Services      Precautions /  Restrictions Precautions Precautions: Fall Restrictions Weight Bearing Restrictions: Yes RLE Weight Bearing: Weight bearing as tolerated       Mobility Bed Mobility Overal bed mobility: Needs Assistance Bed Mobility: Supine to Sit     Supine to sit: Min assist     General bed mobility comments: left sitting EOB with sitter in room    Transfers Overall transfer level: Needs assistance Equipment used: 1 person hand held assist Transfers: Sit to/from Stand Sit to Stand: Min assist           General transfer comment: Becomes fearful in standing requiring MIN A to maintain static standing for periaccess     Balance Overall balance assessment: Needs assistance Sitting-balance support: Feet supported Sitting balance-Leahy Scale: Good     Standing balance support: Bilateral upper extremity supported;Reliant on assistive device for balance Standing balance-Leahy Scale: Fair Standing balance comment: rail and HHA                           ADL either performed or assessed with clinical judgement   ADL Overall ADL's : Needs assistance/impaired                                       General ADL Comments: SETUP + SBA self-feeding sitting EOB - cues for initation and safety as pt is impulsive/disoriented. MOD A for LB access seated EOB.      Cognition Arousal/Alertness: Awake/alert Behavior During Therapy: Impulsive;Flat affect Overall Cognitive Status: History of cognitive impairments - at baseline  General Comments: Pleasant and cooperative. Requires max multimodal cuing to follow commands for safe participation.                     Pertinent Vitals/ Pain       Pain Assessment: Faces Faces Pain Scale: Hurts a little bit Pain Location: R hip Pain Descriptors / Indicators: Grimacing;Guarding Pain Intervention(s): Limited activity within patient's tolerance;Repositioned          Frequency  Min 2X/week        Progress Toward Goals  OT Goals(current goals can now be found in the care plan section)  Progress towards OT goals: Progressing toward goals  Acute Rehab OT Goals Patient Stated Goal: to go home OT Goal Formulation: With patient Time For Goal Achievement: 12/28/21 Potential to Achieve Goals: Fair ADL Goals Pt Will Perform Grooming: with min assist;standing Pt Will Perform Lower Body Dressing: with min assist;sit to/from stand Pt Will Transfer to Toilet: with min assist;ambulating Pt Will Perform Toileting - Clothing Manipulation and hygiene: with min assist;sit to/from stand  Plan Discharge plan remains appropriate;Frequency remains appropriate    Co-evaluation                 AM-PAC OT "6 Clicks" Daily Activity     Outcome Measure   Help from another person eating meals?: A Little Help from another person taking care of personal grooming?: A Little Help from another person toileting, which includes using toliet, bedpan, or urinal?: A Lot Help from another person bathing (including washing, rinsing, drying)?: A Lot Help from another person to put on and taking off regular upper body clothing?: A Lot Help from another person to put on and taking off regular lower body clothing?: A Lot 6 Click Score: 14    End of Session    OT Visit Diagnosis: Unsteadiness on feet (R26.81);Repeated falls (R29.6);Muscle weakness (generalized) (M62.81);History of falling (Z91.81)   Activity Tolerance Patient tolerated treatment well   Patient Left in bed;with call bell/phone within reach;with nursing/sitter in room   Nurse Communication Mobility status        Time: 4784-1282 OT Time Calculation (min): 23 min  Charges: OT General Charges $OT Visit: 1 Visit OT Treatments $Self Care/Home Management : 23-37 mins  Dessie Coma, M.S. OTR/L  12/24/21, 3:43 PM  ascom [phone]

## 2021-12-25 DIAGNOSIS — S72001S Fracture of unspecified part of neck of right femur, sequela: Secondary | ICD-10-CM | POA: Diagnosis not present

## 2021-12-25 DIAGNOSIS — R410 Disorientation, unspecified: Secondary | ICD-10-CM | POA: Diagnosis not present

## 2021-12-25 DIAGNOSIS — F03918 Unspecified dementia, unspecified severity, with other behavioral disturbance: Secondary | ICD-10-CM | POA: Diagnosis not present

## 2021-12-25 DIAGNOSIS — T8149XA Infection following a procedure, other surgical site, initial encounter: Secondary | ICD-10-CM | POA: Diagnosis not present

## 2021-12-25 MED ORDER — POLYETHYLENE GLYCOL 3350 17 G PO PACK
17.0000 g | PACK | Freq: Every day | ORAL | Status: DC
  Administered 2021-12-26 – 2021-12-30 (×5): 17 g via ORAL
  Filled 2021-12-25 (×5): qty 1

## 2021-12-25 NOTE — Progress Notes (Signed)
Patient ID: Morgan Stewart, female   DOB: 01-24-1946, 76 y.o.   MRN: [id number] [facility] PROGRESS NOTE  Shaquan Puerta PPI:[id number] DOB: 15-Jul-1946 DOA: 12/12/2021 PCP: Lequita Asal, MD (Inactive)  HPI/Subjective: Patient more alert and talkative today.  Able to straight leg raise a little bit with her right leg today.  Initially admitted after a fall and found to have a right hip fracture requiring operative repair.  Then found to be COVID-positive.  Objective: Vitals:   12/25/21 0903 12/25/21 1207  BP: 108/61 97/65  Pulse: 67 79  Resp: 20 20  Temp: (!) 97.4 F (36.3 C) 98.5 F (36.9 C)  SpO2: 90% 93%    Intake/Output Summary (Last 24 hours) at 12/25/2021 1432 Last data filed at 12/25/2021 1030 Gross per 24 hour  Intake 740.68 ml  Output 400 ml  Net 340.68 ml   Filed Weights   12/12/21 1420  Weight: 72 kg    ROS: Review of Systems  Respiratory:  Negative for shortness of breath.   Cardiovascular:  Negative for chest pain.  Gastrointestinal:  Negative for abdominal pain.  Exam: Physical Exam HENT:     Head: Normocephalic.     Mouth/Throat:     Pharynx: No oropharyngeal exudate.  Eyes:     General: Lids are normal.     Conjunctiva/sclera: Conjunctivae normal.  Cardiovascular:     Rate and Rhythm: Normal rate and regular rhythm.     Heart sounds: Normal heart sounds, S1 normal and S2 normal.  Pulmonary:     Breath sounds: Normal breath sounds. No decreased breath sounds, wheezing, rhonchi or rales.  Abdominal:     Palpations: Abdomen is soft.     Tenderness: There is no abdominal tenderness.  Musculoskeletal:     Right lower leg: No swelling.     Left lower leg: No swelling.  Skin:    General: Skin is warm.     Findings: No rash.  Neurological:     Mental Status: She is alert.     Comments: Answers questions.  Still has some confusion.  Able to straight leg raise better with the left leg in the right leg.      Scheduled Meds:   aspirin EC  81 mg Oral Daily   cephALEXin  500 mg Oral Q6H   Chlorhexidine Gluconate Cloth  6 each Topical Daily   cholecalciferol  2,000 Units Oral Daily   enoxaparin (LOVENOX) injection  40 mg Subcutaneous Q24H   ipratropium  2 spray Nasal TID   melatonin  5 mg Oral QHS   nicotine  7 mg Transdermal Daily   [START ON 12/26/2021] polyethylene glycol  17 g Oral Daily   QUEtiapine  25 mg Oral QHS   senna  1 tablet Oral BID   Continuous Infusions:  sodium chloride 10 mL/hr at 12/23/21 0417   methocarbamol (ROBAXIN) IV     Brief history: 76 year old female with history of CAD, hypertension, COPD, DJD, gout, hyperlipidemia, vitamin D deficiency, vitamin B12 deficiency and tobacco abuse.  She had a fall and was found to have a right hip fracture.  Dr. Mack Guise took for operative repair on 12/13/2021 with intramedullary fixation of right intertrochanteric hip fracture.  Patient was found to be COVID-positive on 12/18/2021 and will have to await 11 days here in the hospital from that point.  Patient also had acute in-hospital delirium and has been receiving Seroquel at night.  Assessment/Plan:  Acute delirium.  Underlying dementia with behavioral disturbance.  Mental status  better today than yesterday.  Continue Seroquel at night.  I got rid of the trazodone because family stated that this makes her mental status worse.  Continue standing dose melatonin.  Continue to monitor. Superficial postoperative wound infection on p.o. Keflex. Right hip fracture requiring operative repair.  This procedure was done on 12/13/2021 by Dr. Mack Guise.  Convert to Tylenol. COVID-19 infection.  Patient will be in isolation through the 13th and out to rehab on the 14th. COPD.  Respiratory status stable. CKD stage II. Rhabdomyolysis from fall.  CPK 2439 and improved down to 342.  I discontinued Crestor.      Code Status:     Code Status Orders  (From admission, onward)           Start     Ordered    12/12/21 2318  Full code  Continuous        12/13/21 0036           Code Status History     This patient has a current code status but no historical code status.      Family Communication: Spoke with patient's niece on the phone and she mentioned that the patient's son will take over the phone calls at this point. Disposition Plan: Status is: Inpatient  Madden Garron [employer]

## 2021-12-25 NOTE — Progress Notes (Signed)
Pt's 02 sats 87% on RA; pt placed on 2L of 02 to sustain 02 sats >92%.  No SOB, however, rales and rhonchi noted on auscultation and pt continues to cough up frequent, small amounts of yellowish to clear sputum. Will continue to monitor and update as needed.

## 2021-12-25 NOTE — Progress Notes (Signed)
°  Subjective:  POD #12 s/p intramedullary fixation for right intertrochanteric hip fracture.   Patient reports right hip pain as mild.  Patient remains in the hospital for 10-day quarantine for testing positive for COVID.  Patient awaiting skilled nursing facility placement.  Objective:   VITALS:   Vitals:   12/24/21 2118 12/24/21 2348 12/25/21 0341 12/25/21 0903  BP: 124/79 125/74 119/81 108/61  Pulse: 74 78 73 67  Resp: 20 18 20 20   Temp: 98 F (36.7 C) 98.3 F (36.8 C) 97.8 F (36.6 C) (!) 97.4 F (36.3 C)  TempSrc: Oral Oral  Axillary  SpO2: 95% 93% 92% 90%  Weight:      Height:        PHYSICAL EXAM: Right lower extremity: Aquacel dressings remain in place. Neurovascular intact Sensation intact distally Intact pulses distally Dorsiflexion/Plantar flexion intact Incision: dressing C/D/I No cellulitis present Compartment soft  LABS  No results found for this or any previous visit (from the past 24 hour(s)).  No results found.  Assessment/Plan: 12 Days Post-Op   Principal Problem:   Closed hip fracture requiring operative repair, right, sequela Active Problems:   Chronic kidney disease (CKD) stage G3a/A1, moderately decreased glomerular filtration rate (GFR) between 45-59 mL/min/1.73 square meter and albuminuria creatinine ratio less than 30 mg/g (HCC)   COPD (chronic obstructive pulmonary disease) (HCC)   Coronary artery disease involving native coronary artery of native heart   COVID-19 virus infection   Acute delirium   Superficial postoperative wound infection   CKD (chronic kidney disease), stage II   Dementia with behavioral disturbance  Continue physical and occupational therapy as patient can tolerate.  Awaiting skilled nursing facility placement once 10-day quarantine for COVID is over.  Continue Lovenox for DVT prophylaxis.    Thornton Park , MD 12/25/2021, 9:34 AM

## 2021-12-25 NOTE — Progress Notes (Signed)
Physical Therapy Treatment Patient Details Name: Morgan Stewart MRN: [id number] DOB: 10-17-1946 Today's Date: 12/25/2021   History of Present Illness Pt is a 76 y/o F admitted on 12/12/21 with c/c of an unwitnessed fall. Pt was found to have displaced R intertrochanteric hip fracture & underwent intramedullary fixation on 12/13/21 by Dr. Mack Guise. Pt also had subsequent acute rhabdomyolysis s/p fall. PMH: CAD, HTN, COPD, DJD, gout, dyslipidemia, vitamin D & B12 deficiencies, tobacco abuse    PT Comments    Pt received in bed with RN and sitter present. Very pleasant and agreeable to participate. Does require frequent encouragement and redirection throughout treatment to stay on task due to cognition but overall able to follow single step commands majority of the time. Able to transfer with minguard at RLE with cuing for sequencing/use of bed rails and HOB elevated. Pt remains impulsive with standing to RW before PT ready for pt to stand. Pt progressing in ambulation to 6' requiring max multimodal cuing for maintaining RW clsoe to BOS, sequencing RW with steps and with turns in room for improved pt safety. Able to safely ambulate with aforementioned cues and transfer to recliner. All needs in reach with chair alarm engaged. D/c recs remain appropriate at this time due to deficits in poor safety awareness, unsafe use of AD, and limited capacity for safe ambulation.    Recommendations for follow up therapy are one component of a multi-disciplinary discharge planning process, led by the attending physician.  Recommendations may be updated based on patient status, additional functional criteria and insurance authorization.  Follow Up Recommendations  Skilled nursing-short term rehab (<3 hours/day)     Assistance Recommended at Discharge Frequent or constant Supervision/Assistance  Patient can return home with the following A lot of help with walking and/or transfers;A lot of help with  bathing/dressing/bathroom;Assistance with cooking/housework;Assistance with feeding;Help with stairs or ramp for entrance;Assist for transportation;Direct supervision/assist for medications management;Direct supervision/assist for financial management   Equipment Recommendations  Hospital bed;Wheelchair cushion (measurements PT);Wheelchair (measurements PT);BSC/3in1;Rolling walker (2 wheels)    Recommendations for Other Services       Precautions / Restrictions Precautions Precautions: Fall Restrictions Weight Bearing Restrictions: Yes RLE Weight Bearing: Weight bearing as tolerated     Mobility  Bed Mobility Overal bed mobility: Needs Assistance Bed Mobility: Supine to Sit     Supine to sit: HOB elevated;Min guard     General bed mobility comments: minguard on RLE. Patient Response: Cooperative;Impulsive  Transfers Overall transfer level: Needs assistance   Transfers: Sit to/from Stand Sit to Stand: Min guard           General transfer comment: Impulsively able to stand before therapist ready for pt. Poor hand placement on RW.    Ambulation/Gait Ambulation/Gait assistance: Min guard;Min assist Gait Distance (Feet): 6 Feet Assistive device: Rolling walker (2 wheels) Gait Pattern/deviations: Step-to pattern;Antalgic       General Gait Details: Required max cuing to safely sequence RW with turning in room.   Stairs             Wheelchair Mobility    Modified Rankin (Stroke Patients Only)       Balance Overall balance assessment: Needs assistance Sitting-balance support: Feet supported Sitting balance-Leahy Scale: Good     Standing balance support: Bilateral upper extremity supported;Reliant on assistive device for balance Standing balance-Leahy Scale: Fair  Cognition Arousal/Alertness: Awake/alert Behavior During Therapy: Impulsive;Flat affect Overall Cognitive Status: History of cognitive  impairments - at baseline                                 General Comments: Remains pleasant. Does require max multimodal cuing for RW sequencing and ambulatio.        Exercises Total Joint Exercises Ankle Circles/Pumps: AROM;Both;10 reps;Supine Heel Slides: AROM;Strengthening;Right;10 reps;Supine Hip ABduction/ADduction: AROM;Strengthening;Right;10 reps Other Exercises Other Exercises: safe use of DME, hand placement with transfers.    General Comments        Pertinent Vitals/Pain Pain Assessment: No/denies pain    Home Living                          Prior Function            PT Goals (current goals can now be found in the care plan section) Acute Rehab PT Goals PT Goal Formulation: Patient unable to participate in goal setting    Frequency    Min 2X/week      PT Plan Current plan remains appropriate    Co-evaluation              AM-PAC PT "6 Clicks" Mobility   Outcome Measure  Help needed turning from your back to your side while in a flat bed without using bedrails?: A Lot Help needed moving from lying on your back to sitting on the side of a flat bed without using bedrails?: A Lot Help needed moving to and from a bed to a chair (including a wheelchair)?: A Little Help needed standing up from a chair using your arms (e.g., wheelchair or bedside chair)?: A Little Help needed to walk in hospital room?: A Little Help needed climbing 3-5 steps with a railing? : A Lot 6 Click Score: 15    End of Session Equipment Utilized During Treatment: Gait belt Activity Tolerance: Patient tolerated treatment well Patient left: in chair;with call bell/phone within reach;with chair alarm set Nurse Communication: Mobility status PT Visit Diagnosis: Unsteadiness on feet (R26.81);Muscle weakness (generalized) (M62.81);Difficulty in walking, not elsewhere classified (R26.2);Pain Pain - Right/Left: Right Pain - part of body: Hip     Time:  1601-0932 PT Time Calculation (min) (ACUTE ONLY): 28 min  Charges:  $Gait Training: 8-22 mins $Therapeutic Exercise: 8-22 mins                    Nilam Quakenbush M. Fairly IV, PT, DPT Physical Therapist- [facility]  12/25/2021, 12:12 PM

## 2021-12-26 ENCOUNTER — Inpatient Hospital Stay: Payer: Medicare HMO

## 2021-12-26 LAB — CBC
HCT: 30.9 % — ABNORMAL LOW (ref 36.0–46.0)
Hemoglobin: 9.7 g/dL — ABNORMAL LOW (ref 12.0–15.0)
MCH: 30.4 pg (ref 26.0–34.0)
MCHC: 31.4 g/dL (ref 30.0–36.0)
MCV: 96.9 fL (ref 80.0–100.0)
Platelets: 164 10*3/uL (ref 150–400)
RBC: 3.19 MIL/uL — ABNORMAL LOW (ref 3.87–5.11)
RDW: 15.3 % (ref 11.5–15.5)
WBC: 4.7 10*3/uL (ref 4.0–10.5)
nRBC: 0 % (ref 0.0–0.2)

## 2021-12-26 LAB — BASIC METABOLIC PANEL
Anion gap: 5 (ref 5–15)
BUN: 11 mg/dL (ref 8–23)
CO2: 31 mmol/L (ref 22–32)
Calcium: 8.9 mg/dL (ref 8.9–10.3)
Chloride: 102 mmol/L (ref 98–111)
Creatinine, Ser: 0.86 mg/dL (ref 0.44–1.00)
GFR, Estimated: >60 mL/min (ref >60)
Glucose, Bld: 97 mg/dL (ref 70–99)
Potassium: 4.7 mmol/L (ref 3.5–5.1)
Sodium: 138 mmol/L (ref 135–145)

## 2021-12-26 MED ORDER — GUAIFENESIN ER 600 MG PO TB12
600.0000 mg | ORAL_TABLET | Freq: Two times a day (BID) | ORAL | Status: DC
  Administered 2021-12-26 – 2021-12-31 (×11): 600 mg via ORAL
  Filled 2021-12-26 (×11): qty 1

## 2021-12-26 NOTE — Progress Notes (Signed)
Progress Note:    Morgan Stewart    MVH:[id number] DOB: [birth year] DOA: 12/12/2021  PCP: Lequita Asal, MD (Inactive)    Brief Narrative:   76 year old female with history of CAD, hypertension, COPD, DJD, gout, hyperlipidemia, vitamin D deficiency, vitamin B12 deficiency and tobacco abuse.  She had a fall and was found to have a right hip fracture.  Dr. Mack Guise took for operative repair on 12/13/2021 with intramedullary fixation of right intertrochanteric hip fracture.  Patient was found to be COVID-positive on 12/18/2021. Patient also had acute in-hospital delirium and has been receiving Seroquel at night.    Subjective:   No acute overnight events reported by nursing staff.  Did have oxygen saturation dropped to 87% on room air overnight and now on 2 L.  She answers some of my questions.  One-to-one sitter present at bedside.  Still in COVID isolation precautions.  She is restless and occasionally trying to get out of bed.   Assessment and Plan:   Acute delirium: Appears improved.  Continue Seroquel at night.  Trazodone was discontinued by previous provider.  Continue melatonin  Acute hypoxic respiratory failure: Secondary to COVID-19 infection.  Currently on 2 L.  Remains in isolation through the 13th.  Cannot go to SNF rehab until 14th.  Chest x-ray today showed no pneumonia, therefore will not start remdesivir, steroids or antibiotics.  Superficial postoperative wound infection: Continue p.o. Keflex  Right hip fracture: Status post repair on 12/13/2021 by Dr. Mack Guise.  COPD: Not in acute exacerbation  Chronic kidney disease stage II: Appears stable.  Rhabdomyolysis: Resolved with IV fluids.  CPK was initially 2439 and improved down to 342.  Home Crestor was discontinued due to this.  Can resume on discharge.  CAD: Status post PCI in 2004. Continue home aspirin 81 mg daily.  Home statin on hold due to reason mentioned above.  Hypertension: Home hydralazine  on hold since admission.  Tobacco dependence: Counseled on smoking cessation.  Continue nicotine patch.   Other information:    DVT prophylaxis: Lovenox subcu Code Status: Full code Family Communication: No family present at bedside Disposition:   Status is: Inpatient  Remains inpatient appropriate because: Needs SNF rehab after hip repair but currently in [place] isolation through 1/13.    Consultants:   Orthopedics    Objective:    Vitals:   12/25/21 2307 12/26/21 0553 12/26/21 0822 12/26/21 1157  BP: 120/78 (!) 153/84 114/66 (!) 126/91  Pulse: 79 74 75 85  Resp: 20 18 18 18   Temp: 98.9 F (37.2 C) 97.9 F (36.6 C) 98.4 F (36.9 C) 98.8 F (37.1 C)  TempSrc:  Oral Oral Oral  SpO2: 93% 100% 100% 94%  Weight:      Height:        Intake/Output Summary (Last 24 hours) at 12/26/2021 1410 Last data filed at 12/26/2021 1305 Gross per 24 hour  Intake 580 ml  Output 1100 ml  Net -520 ml   Filed Weights   12/12/21 1420  Weight: 72 kg       Physical Exam:    General exam: Appears calm and comfortable  Respiratory system: Clear to auscultation. Respiratory effort normal. Cardiovascular system: S1 & S2 heard, RRR. No JVD, murmurs, rubs, gallops or clicks. No pedal edema. Gastrointestinal system: Abdomen is nondistended, soft and nontender. No organomegaly or masses felt. Normal bowel sounds heard. Central nervous system: Alert and oriented. No focal neurological deficits. Extremities: Symmetric 5 x 5 power. Skin: No rashes,  lesions or ulcers, right hip dressing clean dry and intact.  Neurovascularly intact.  Compartments are soft. Psychiatry: Judgement and insight appear normal. Mood & affect appropriate.     Data Reviewed:    I have personally reviewed following labs and imaging studies  CBC: Recent Labs  Lab 12/20/21 0505 12/23/21 0517 12/26/21 0528  WBC 5.2 7.5 4.7  HGB 9.3* 9.6* 9.7*  HCT 27.9* 29.8* 30.9*  MCV 95.9 94.6 96.9  PLT 136* 156  169    Basic Metabolic Panel: Recent Labs  Lab 12/20/21 0505 12/26/21 0528  NA 142 138  K 4.1 4.7  CL 105 102  CO2 28 31  GLUCOSE 86 97  BUN 17 11  CREATININE 0.76 0.86  CALCIUM 8.7* 8.9    GFR: Estimated Creatinine Clearance: 57.5 mL/min (by C-G formula based on SCr of 0.86 mg/dL).  Liver Function Tests: No results for input(s): AST, ALT, ALKPHOS, BILITOT, PROT, ALBUMIN in the last 168 hours.  CBG: No results for input(s): GLUCAP in the last 168 hours.   Recent Results (from the past 240 hour(s))  Resp Panel by RT-PCR (Flu A&B, Covid) Nasopharyngeal Swab     Status: Abnormal   Collection Time: 12/18/21  2:30 PM   Specimen: Nasopharyngeal Swab; Nasopharyngeal(NP) swabs in vial transport medium  Result Value Ref Range Status   SARS Coronavirus 2 by RT PCR POSITIVE (A) NEGATIVE Final    Comment: (NOTE) SARS-CoV-2 target nucleic acids are DETECTED.  The SARS-CoV-2 RNA is generally detectable in upper respiratory specimens during the acute phase of infection. Positive results are indicative of the presence of the identified virus, but do not rule out bacterial infection or co-infection with other pathogens not detected by the test. Clinical correlation with patient history and other diagnostic information is necessary to determine patient infection status. The expected result is Negative.  Fact Sheet for Patients: EntrepreneurPulse.com.au  Fact Sheet for Healthcare Providers: IncredibleEmployment.be  This test is not yet approved or cleared by the [place] FDA and  has been authorized for detection and/or diagnosis of SARS-CoV-2 by FDA under an Emergency Use Authorization (EUA).  This EUA will remain in effect (meaning this test can be used) for the duration of  the COVID-19 declaration under Section 564(b)(1) of the A ct, 21 U.S.C. section 360bbb-3(b)(1), unless the authorization is terminated or revoked sooner.      Influenza A by PCR NEGATIVE NEGATIVE Final   Influenza B by PCR NEGATIVE NEGATIVE Final    Comment: (NOTE) The Xpert Xpress SARS-CoV-2/FLU/RSV plus assay is intended as an aid in the diagnosis of influenza from Nasopharyngeal swab specimens and should not be used as a sole basis for treatment. Nasal washings and aspirates are unacceptable for Xpert Xpress SARS-CoV-2/FLU/RSV testing.  Fact Sheet for Patients: EntrepreneurPulse.com.au  Fact Sheet for Healthcare Providers: IncredibleEmployment.be  This test is not yet approved or cleared by the [place] FDA and has been authorized for detection and/or diagnosis of SARS-CoV-2 by FDA under an Emergency Use Authorization (EUA). This EUA will remain in effect (meaning this test can be used) for the duration of the COVID-19 declaration under Section 564(b)(1) of the Act, 21 U.S.C. section 360bbb-3(b)(1), unless the authorization is terminated or revoked.  Performed at [facility], [address] [place],  [address]          Radiology Studies:    [facility] Chest [place] 1 View  Result Date: 12/26/2021 CLINICAL DATA:  Cough EXAM: PORTABLE CHEST 1 VIEW COMPARISON:  Chest  x-ray dated December 18, 2021 FINDINGS: Cardiac and mediastinal contours are unchanged. Elevation of the left hemidiaphragm. Mild bibasilar opacities which likely represent atelectasis. Lungs otherwise clear. No large pleural effusion or pneumothorax. IMPRESSION: No active disease. Electronically Signed   By: Yetta Glassman M.D.   On: 12/26/2021 09:45        Medications:    Scheduled Meds:  aspirin EC  81 mg Oral Daily   cephALEXin  500 mg Oral Q6H   Chlorhexidine Gluconate Cloth  6 each Topical Daily   cholecalciferol  2,000 Units Oral Daily   enoxaparin (LOVENOX) injection  40 mg Subcutaneous Q24H   guaiFENesin  600 mg Oral BID   ipratropium  2 spray Nasal TID   melatonin  5 mg Oral QHS   nicotine  7 mg  Transdermal Daily   polyethylene glycol  17 g Oral Daily   QUEtiapine  25 mg Oral QHS   senna  1 tablet Oral BID   Continuous Infusions:  sodium chloride 10 mL/hr at 12/23/21 0417   methocarbamol (ROBAXIN) IV         LOS: 14 days    Time spent: 35-minutes    Leslee Home, MD [facility]   To contact the attending provider between 7A-7P or the covering provider during after hours 7P-7A, please log into the web site [email or web address] and access using universal [place] password for that web site. If you do not have the password, please call the hospital operator.  12/26/2021, 2:10 PM

## 2021-12-26 NOTE — TOC Progression Note (Signed)
Transition of Care [facility]) - Progression Note    Patient Details  Name: Morgan Stewart MRN: [id number] Date of Birth: Oct 16, 1946  Transition of Care [facility]) CM/SW [place], RN Phone Number: 12/26/2021, 10:36 AM  Clinical Narrative:   TOC continues to monitor and assist with DC planning, Can not DC to [facility] until 1/14 due to Covid, TOC will submit ins auth clinical notes on 1/13    Expected Discharge Plan: [place] Barriers to Discharge: Continued Medical Work up  Expected Discharge Plan and Services Expected Discharge Plan: [place] arrangements for the past 2 months: Single Family Home Expected Discharge Date: 12/18/21                                     Social Determinants of Health (SDOH) Interventions    Readmission Risk Interventions Readmission Risk Prevention Plan 12/14/2021  Post Dischage Appt Complete  Medication Screening Complete  Transportation Screening Complete  Some recent data might be hidden

## 2021-12-26 NOTE — Plan of Care (Signed)

## 2021-12-26 NOTE — Evaluation (Addendum)
Occupational Therapy Re-Evaluation Patient Details Name: Morgan Stewart MRN: [id number] DOB: Jan 27, 1946 Today's Date: 12/26/2021   History of Present Illness Pt is a 76 y/o F admitted on 12/12/21 with c/c of an unwitnessed fall. Pt was found to have displaced R intertrochanteric hip fracture & underwent intramedullary fixation on 12/13/21 by Dr. Mack Guise. Pt also had subsequent acute rhabdomyolysis s/p fall. PMH: CAD, HTN, COPD, DJD, gout, dyslipidemia, vitamin D & B12 deficiencies, tobacco abuse   Clinical Impression   Morgan Stewart was seen for OT re-evaluation this date. Prior to hospital admission, pt was independent, living alone, in a 1-level home. However, per chart there are concerns about baseline cognition and living situation. Pt continues to be functionally limited by increased pain/decreased ROM in her RLE and impaired cogntion. She requires MOD-MAX A for LB access seated EOB. MIN A for functional mobility with multimodal cueing provided t/o session.  She has demonstrated improved functional mobility and self-care skills since time of initial OT evaluation. Goals upgraded to reflect current progression. She continues to benefit from skilled OT services to address noted impairments and functional limitations (see below for any additional details) in order to maximize safety and independence while minimizing falls risk and caregiver burden. Upon hospital discharge, continue to recommend STR to maximize pt safety and return to PLOF.        Recommendations for follow up therapy are one component of a multi-disciplinary discharge planning process, led by the attending physician.  Recommendations may be updated based on patient status, additional functional criteria and insurance authorization.   Follow Up Recommendations  Skilled nursing-short term rehab (<3 hours/day)    Assistance Recommended at Discharge Frequent or constant Supervision/Assistance  Patient can return home with the  following A lot of help with walking and/or transfers;A lot of help with bathing/dressing/bathroom;Help with stairs or ramp for entrance    Functional Status Assessment  Patient has had a recent decline in their functional status and demonstrates the ability to make significant improvements in function in a reasonable and predictable amount of time.  Equipment Recommendations  Other (comment)    Recommendations for Other Services       Precautions / Restrictions Precautions Precautions: Fall Restrictions Weight Bearing Restrictions: Yes RLE Weight Bearing: Weight bearing as tolerated      Mobility Bed Mobility Overal bed mobility: Needs Assistance Bed Mobility: Supine to Sit     Supine to sit: Supervision;HOB elevated          Transfers Overall transfer level: Needs assistance   Transfers: Sit to/from Stand Sit to Stand: Min assist;Supervision Stand pivot transfers: From elevated surface;Min guard;Min assist         General transfer comment: Initial MIN A for STS to bring hips off (min elevated) bed. Close min guard and assistance for sequencing RW use during SPT to recliner. Increased time/effort to perform functional mobility this date.      Balance Overall balance assessment: Needs assistance Sitting-balance support: Feet supported Sitting balance-Leahy Scale: Good Sitting balance - Comments: Steady static sitting at EOB with supervision. Able to reach within BOS.   Standing balance support: Bilateral upper extremity supported;Reliant on assistive device for balance Standing balance-Leahy Scale: Fair                             ADL either performed or assessed with clinical judgement   ADL Overall ADL's : Needs assistance/impaired Eating/Feeding: Minimal assistance;Supervision/ safety;Set up;Cueing for safety Eating/Feeding Details (  indicate cue type and reason): Requires consistent re-direction to breakfast tray items.                  Lower Body Dressing: Maximal assistance;Sitting/lateral leans;Cueing for safety;Cueing for sequencing Lower Body Dressing Details (indicate cue type and reason): Max multimodal cueing and ultimately MAX A to adjust socks while sitting EOB. Improved functional reach to LLE vs. RLE Toilet Transfer: Minimal assistance;Ambulation;BSC/3in1             General ADL Comments: Pt continues to be functionally limited by increased pain/decreased ROM in her RLE and impaired cogntion. She requires MOD-MAX A for LB access seated EOB. MIN A for functional mobility with multimodal cueing provided t/o session.     Vision Patient Visual Report: No change from baseline       Perception     Praxis      Pertinent Vitals/Pain Pain Assessment: No/denies pain Faces Pain Scale: Hurts a little bit Pain Location: R hip Pain Descriptors / Indicators: Grimacing;Guarding Pain Intervention(s): Monitored during session;Limited activity within patient's tolerance;Repositioned     Hand Dominance Right   Extremity/Trunk Assessment Upper Extremity Assessment Upper Extremity Assessment: Generalized weakness   Lower Extremity Assessment Lower Extremity Assessment: Generalized weakness;RLE deficits/detail RLE Deficits / Details: s/p R IM nailing.   Cervical / Trunk Assessment Cervical / Trunk Assessment: Kyphotic   Communication     Cognition Arousal/Alertness: Awake/alert Behavior During Therapy: Impulsive;Flat affect Overall Cognitive Status: History of cognitive impairments - at baseline                                 General Comments: Pleasant t/o session. Requires max multimodal cuing for RW sequencing and ambulation     General Comments       Exercises Other Exercises Other Exercises: OT facilitates safe transfer and LB dressing task with education on energy conservation, falls prevention, and safe use of AE/DME for ADL management.   Shoulder Instructions      Home Living  Family/patient expects to be discharged to:: Skilled nursing facility Living Arrangements: Alone                                      Prior Functioning/Environment Prior Level of Function : Independent/Modified Independent                        OT Problem List: Decreased strength;Decreased activity tolerance;Impaired balance (sitting and/or standing);Decreased safety awareness;Pain;Decreased cognition;Cardiopulmonary status limiting activity;Decreased knowledge of use of DME or AE      OT Treatment/Interventions: Self-care/ADL training;Manual therapy;Therapeutic exercise;Modalities;Patient/family education;Balance training;Energy conservation;Therapeutic activities;DME and/or AE instruction;Cognitive remediation/compensation    OT Goals(Current goals can be found in the care plan section) Acute Rehab OT Goals Patient Stated Goal: To go home OT Goal Formulation: With patient Time For Goal Achievement: 01/09/22 Potential to Achieve Goals: Fair ADL Goals Pt Will Perform Lower Body Dressing: with set-up;with supervision;sit to/from stand;with adaptive equipment Pt Will Transfer to Toilet: bedside commode;ambulating;with supervision;with set-up Pt Will Perform Toileting - Clothing Manipulation and hygiene: sit to/from stand;with supervision;with set-up  OT Frequency: Min 2X/week    Co-evaluation              AM-PAC OT "6 Clicks" Daily Activity     Outcome Measure Help from another person eating meals?: A Little Help from  another person taking care of personal grooming?: A Little Help from another person toileting, which includes using toliet, bedpan, or urinal?: A Lot Help from another person bathing (including washing, rinsing, drying)?: A Lot Help from another person to put on and taking off regular upper body clothing?: A Little Help from another person to put on and taking off regular lower body clothing?: A Lot 6 Click Score: 15   End of Session  Equipment Utilized During Treatment: Rolling walker (2 wheels) Nurse Communication: Mobility status  Activity Tolerance: Patient tolerated treatment well Patient left: in bed;with call bell/phone within reach;with nursing/sitter in room  OT Visit Diagnosis: Unsteadiness on feet (R26.81);Repeated falls (R29.6);Muscle weakness (generalized) (M62.81);History of falling (Z91.81)                Time: 3818-2993 OT Time Calculation (min): 23 min Charges:  OT General Charges $OT Visit: 1 Visit OT Evaluation $OT Re-eval: 1 Re-eval OT Treatments $Self Care/Home Management : 8-22 mins  Shara Blazing, M.S., OTR/L Feeding Team - [facility] Ascom: [phone] 12/26/21, 10:31 AM

## 2021-12-27 NOTE — Progress Notes (Signed)
Physical Therapy Treatment Patient Details Name: Morgan Stewart MRN: [id number] DOB: [birth year] Today's Date: 12/27/2021   History of Present Illness Pt is a 76 y/o F admitted on 12/12/21 with c/c of an unwitnessed fall. Pt was found to have displaced R intertrochanteric hip fracture & underwent intramedullary fixation on 12/13/21 by Dr. Mack Guise. Pt also had subsequent acute rhabdomyolysis s/p fall. PMH: CAD, HTN, COPD, DJD, gout, dyslipidemia, vitamin D & B12 deficiencies, tobacco abuse    PT Comments    Pt received in bed agreeable to PT. Pt lively today, very conversational, less impulsive. Pt still demonstrating some confusion throughout session. Pt requiring frequent cuing/re-direction to stay on task with LE therex.  Able to attain sitting EOB with supervision and increased time. Able to stand and ambulate with RW within room but does require consistent, hand over hand cuing on RW for proximity and sequencing with turns. Pt placed in recliner with all needs in reach and chair alarm engaged. D/c recs remain appropriate at this time due to poor safety awareness, impulsiveness, and limited capacity to ambulate.    Recommendations for follow up therapy are one component of a multi-disciplinary discharge planning process, led by the attending physician.  Recommendations may be updated based on patient status, additional functional criteria and insurance authorization.  Follow Up Recommendations  Skilled nursing-short term rehab (<3 hours/day)     Assistance Recommended at Discharge Frequent or constant Supervision/Assistance  Patient can return home with the following A lot of help with walking and/or transfers;A lot of help with bathing/dressing/bathroom;Assistance with cooking/housework;Assistance with feeding;Help with stairs or ramp for entrance;Assist for transportation;Direct supervision/assist for medications management;Direct supervision/assist for financial management   Equipment  Recommendations  Hospital bed;Wheelchair cushion (measurements PT);Wheelchair (measurements PT);BSC/3in1;Rolling walker (2 wheels)    Recommendations for Other Services       Precautions / Restrictions Precautions Precautions: Fall Restrictions Weight Bearing Restrictions: Yes RLE Weight Bearing: Weight bearing as tolerated     Mobility  Bed Mobility Overal bed mobility: Needs Assistance Bed Mobility: Supine to Sit     Supine to sit: Supervision;HOB elevated       Patient Response: Cooperative  Transfers Overall transfer level: Needs assistance Equipment used: Rolling walker (2 wheels) Transfers: Sit to/from Stand Sit to Stand: Min guard                Ambulation/Gait Ambulation/Gait assistance: Min guard Gait Distance (Feet): 25 Feet Assistive device: Rolling walker (2 wheels) Gait Pattern/deviations: Step-to pattern;Antalgic       General Gait Details: Requiring max cuing for safe turning with RW   Stairs             Wheelchair Mobility    Modified Rankin (Stroke Patients Only)       Balance Overall balance assessment: Needs assistance Sitting-balance support: Feet supported Sitting balance-Leahy Scale: Good     Standing balance support: Bilateral upper extremity supported;Reliant on assistive device for balance Standing balance-Leahy Scale: Fair Standing balance comment: Reliant on RW for support                            Cognition Arousal/Alertness: Awake/alert Behavior During Therapy: Impulsive;Flat affect Overall Cognitive Status: History of cognitive impairments - at baseline  Exercises Total Joint Exercises Ankle Circles/Pumps: AROM;Both;10 reps;Supine Heel Slides: AROM;Strengthening;Right;10 reps;Supine Hip ABduction/ADduction: AROM;Strengthening;Right;10 reps Straight Leg Raises: AAROM;Strengthening;Right;10 reps;Supine Long Arc Quad:  AROM;Seated;Both;5 reps    General Comments General comments (skin integrity, edema, etc.): on 2 L/min throughout session      Pertinent Vitals/Pain Pain Assessment: No/denies pain    Home Living                          Prior Function            PT Goals (current goals can now be found in the care plan section) Acute Rehab PT Goals Patient Stated Goal: none stated PT Goal Formulation: Patient unable to participate in goal setting    Frequency    Min 2X/week      PT Plan Current plan remains appropriate    Co-evaluation              AM-PAC PT "6 Clicks" Mobility   Outcome Measure  Help needed turning from your back to your side while in a flat bed without using bedrails?: A Lot Help needed moving from lying on your back to sitting on the side of a flat bed without using bedrails?: A Lot Help needed moving to and from a bed to a chair (including a wheelchair)?: A Little Help needed standing up from a chair using your arms (e.g., wheelchair or bedside chair)?: A Little Help needed to walk in hospital room?: A Little Help needed climbing 3-5 steps with a railing? : A Lot 6 Click Score: 15    End of Session Equipment Utilized During Treatment: Gait belt Activity Tolerance: Patient tolerated treatment well Patient left: in chair;with call bell/phone within reach;with chair alarm set;with nursing/sitter in room Nurse Communication: Mobility status PT Visit Diagnosis: Unsteadiness on feet (R26.81);Muscle weakness (generalized) (M62.81);Difficulty in walking, not elsewhere classified (R26.2);Pain Pain - Right/Left: Right Pain - part of body: Hip     Time: 3976-7341 PT Time Calculation (min) (ACUTE ONLY): 26 min  Charges:  $Gait Training: 8-22 mins $Therapeutic Exercise: 8-22 mins                    Samyak Sackmann M. Fairly IV, PT, DPT Physical Therapist- [facility]  12/27/2021, 1:57 PM

## 2021-12-27 NOTE — Progress Notes (Addendum)
Progress Note:    Morgan Stewart    KGU:[id number] DOB: 1946/11/30 DOA: 12/12/2021  PCP: Lequita Asal, MD (Inactive)    Brief Narrative:   76 year old female with history of CAD, hypertension, COPD, DJD, gout, hyperlipidemia, vitamin D deficiency, vitamin B12 deficiency and tobacco abuse.  She had a fall and was found to have a right hip fracture.  Dr. Mack Guise took for operative repair on 12/13/2021 with intramedullary fixation of right intertrochanteric hip fracture.  Patient was found to be COVID-positive on 12/18/2021. Patient also had acute in-hospital delirium and trazodone was discontinued.    Subjective:   No acute overnight events reported by nursing staff. More lively today. Worked with PT and ambulate in the room. Still on 2L . Awaiting to go to SNF once off isolation. Sitting in recliner now after working with PT.   Assessment and Plan:   Acute delirium/encephalopathy: Resolved. Continue Seroquel and melatonin.  Trazodone was discontinued by previous provider due to concern for polypharmacy.  Acute hypoxic respiratory failure: Secondary to COVID-19 infection.  Currently on 2 L.  Remains in isolation through the 13th.  Cannot go to SNF rehab until 14th.  Chest x-ray yesterday showed no pneumonia, therefore will not start remdesivir, steroids or antibiotics.  Superficial postoperative wound infection: Continue p.o. Keflex  Right hip fracture: Status post repair on 12/13/2021 by Dr. Mack Guise.  Working with PT. Ortho recommends ASA 325mg  BID x 4 weeks for DVT prevention upon discharge. Can resume home ASA 81mg  daily thereafter.  COPD: Not in acute exacerbation.  Currently on 2 L.  Chronic kidney disease stage II: Appears stable.  Rhabdomyolysis: Resolved with IV fluids.  CPK was initially 2439 and improved down to 342.  Home Crestor was discontinued due to this.  Can resume on discharge.  CAD: Status post PCI in 2004. Continue home aspirin 81 mg daily.   Home statin on hold due to reason mentioned above.  Hypertension: Home hydralazine on hold since admission.  Can resume on discharge.  Tobacco dependence: Counseled on smoking cessation.  Continue nicotine patch.   Other information:    DVT prophylaxis: Lovenox subcu Code Status: Full code Family Communication: No family present at bedside Disposition:   Status is: Inpatient  Remains inpatient appropriate because: Needs SNF rehab after hip repair but currently in [place] isolation through 1/13.    Consultants:   Orthopedics    Objective:    Vitals:   12/26/21 1157 12/27/21 0438 12/27/21 0817 12/27/21 1159  BP: (!) 126/91 110/78 137/81 104/84  Pulse: 85 72 79 84  Resp: 18 16 16 16   Temp: 98.8 F (37.1 C) 98.2 F (36.8 C) 98.8 F (37.1 C) 98.4 F (36.9 C)  TempSrc: Oral  Oral Oral  SpO2: 94% 92% 92% 95%  Weight:      Height:        Intake/Output Summary (Last 24 hours) at 12/27/2021 1429 Last data filed at 12/27/2021 1314 Gross per 24 hour  Intake 480 ml  Output 1400 ml  Net -920 ml    Filed Weights   12/12/21 1420  Weight: 72 kg       Physical Exam:    General exam: Appears calm and comfortable, more lively today. Respiratory system: Clear to auscultation. Respiratory effort normal. Cardiovascular system: S1 & S2 heard, RRR. No JVD, murmurs, rubs, gallops or clicks. No pedal edema. Gastrointestinal system: Abdomen is nondistended, soft and nontender. No organomegaly or masses felt. Normal bowel sounds heard. Central nervous system:  Alert and oriented. No focal neurological deficits. Extremities: Symmetric 5 x 5 power. Skin: No rashes, lesions or ulcers, right hip dressing clean dry and intact.  Neurovascularly intact.  Compartments are soft. Psychiatry: Judgement and insight appear normal. Mood & affect appropriate.     Data Reviewed:    I have personally reviewed following labs and imaging studies  CBC: Recent Labs  Lab 12/23/21 0517  12/26/21 0528  WBC 7.5 4.7  HGB 9.6* 9.7*  HCT 29.8* 30.9*  MCV 94.6 96.9  PLT 156 164     Basic Metabolic Panel: Recent Labs  Lab 12/26/21 0528  NA 138  K 4.7  CL 102  CO2 31  GLUCOSE 97  BUN 11  CREATININE 0.86  CALCIUM 8.9     GFR: Estimated Creatinine Clearance: 57.5 mL/min (by C-G formula based on SCr of 0.86 mg/dL).  Liver Function Tests: No results for input(s): AST, ALT, ALKPHOS, BILITOT, PROT, ALBUMIN in the last 168 hours.  CBG: No results for input(s): GLUCAP in the last 168 hours.   Recent Results (from the past 240 hour(s))  Resp Panel by RT-PCR (Flu A&B, Covid) Nasopharyngeal Swab     Status: Abnormal   Collection Time: 12/18/21  2:30 PM   Specimen: Nasopharyngeal Swab; Nasopharyngeal(NP) swabs in vial transport medium  Result Value Ref Range Status   SARS Coronavirus 2 by RT PCR POSITIVE (A) NEGATIVE Final    Comment: (NOTE) SARS-CoV-2 target nucleic acids are DETECTED.  The SARS-CoV-2 RNA is generally detectable in upper respiratory specimens during the acute phase of infection. Positive results are indicative of the presence of the identified virus, but do not rule out bacterial infection or co-infection with other pathogens not detected by the test. Clinical correlation with patient history and other diagnostic information is necessary to determine patient infection status. The expected result is Negative.  Fact Sheet for Patients: EntrepreneurPulse.com.au  Fact Sheet for Healthcare Providers: IncredibleEmployment.be  This test is not yet approved or cleared by the [place] FDA and  has been authorized for detection and/or diagnosis of SARS-CoV-2 by FDA under an Emergency Use Authorization (EUA).  This EUA will remain in effect (meaning this test can be used) for the duration of  the COVID-19 declaration under Section 564(b)(1) of the A ct, 21 U.S.C. section 360bbb-3(b)(1), unless the  authorization is terminated or revoked sooner.     Influenza A by PCR NEGATIVE NEGATIVE Final   Influenza B by PCR NEGATIVE NEGATIVE Final    Comment: (NOTE) The Xpert Xpress SARS-CoV-2/FLU/RSV plus assay is intended as an aid in the diagnosis of influenza from Nasopharyngeal swab specimens and should not be used as a sole basis for treatment. Nasal washings and aspirates are unacceptable for Xpert Xpress SARS-CoV-2/FLU/RSV testing.  Fact Sheet for Patients: EntrepreneurPulse.com.au  Fact Sheet for Healthcare Providers: IncredibleEmployment.be  This test is not yet approved or cleared by the [place] FDA and has been authorized for detection and/or diagnosis of SARS-CoV-2 by FDA under an Emergency Use Authorization (EUA). This EUA will remain in effect (meaning this test can be used) for the duration of the COVID-19 declaration under Section 564(b)(1) of the Act, 21 U.S.C. section 360bbb-3(b)(1), unless the authorization is terminated or revoked.  Performed at [facility], [address] [place], [place] [address]           Radiology Studies:    [facility] Chest [place] 1 View  Result Date: 12/26/2021 CLINICAL DATA:  Cough EXAM: PORTABLE CHEST 1 VIEW COMPARISON:  Chest x-ray dated December 18, 2021 FINDINGS: Cardiac and mediastinal contours are unchanged. Elevation of the left hemidiaphragm. Mild bibasilar opacities which likely represent atelectasis. Lungs otherwise clear. No large pleural effusion or pneumothorax. IMPRESSION: No active disease. Electronically Signed   By: Yetta Glassman M.D.   On: 12/26/2021 09:45        Medications:    Scheduled Meds:  aspirin EC  81 mg Oral Daily   cephALEXin  500 mg Oral Q6H   Chlorhexidine Gluconate Cloth  6 each Topical Daily   cholecalciferol  2,000 Units Oral Daily   enoxaparin (LOVENOX) injection  40 mg Subcutaneous Q24H   guaiFENesin  600 mg Oral BID   ipratropium  2 spray  Nasal TID   melatonin  5 mg Oral QHS   nicotine  7 mg Transdermal Daily   polyethylene glycol  17 g Oral Daily   QUEtiapine  25 mg Oral QHS   senna  1 tablet Oral BID   Continuous Infusions:  sodium chloride 10 mL/hr at 12/23/21 0417   methocarbamol (ROBAXIN) IV         LOS: 15 days    Time spent: 35-minutes    Leslee Home, MD [facility]   To contact the attending provider between 7A-7P or the covering provider during after hours 7P-7A, please log into the web site [email or web address] and access using universal [place] password for that web site. If you do not have the password, please call the hospital operator.  12/27/2021, 2:29 PM

## 2021-12-27 NOTE — Progress Notes (Signed)
°  Subjective:  POD #14 s/p intramedullary fixation for right intertrochanteric hip fracture.   Patient reports right hip pain as minimal.  Patient has dementia and is confused but awake and can follow commands for exam.  Objective:   VITALS:   Vitals:   12/26/21 1157 12/27/21 0438 12/27/21 0817 12/27/21 1159  BP: (!) 126/91 110/78 137/81 104/84  Pulse: 85 72 79 84  Resp: 18 16 16 16   Temp: 98.8 F (37.1 C) 98.2 F (36.8 C) 98.8 F (37.1 C) 98.4 F (36.9 C)  TempSrc: Oral  Oral Oral  SpO2: 94% 92% 92% 95%  Weight:      Height:        PHYSICAL EXAM: Right lower extremity Neurovascular intact Sensation intact distally Intact pulses distally Dorsiflexion/Plantar flexion intact Incision: no drainage, staples in place.  Mild erythema around proximal incision likely a reaction to the staples.  No drainage, fluctuance or other signs of infection. No cellulitis present Compartment soft  LABS  No results found for this or any previous visit (from the past 24 hour(s)).  DG Chest Port 1 View  Result Date: 12/26/2021 CLINICAL DATA:  Cough EXAM: PORTABLE CHEST 1 VIEW COMPARISON:  Chest x-ray dated December 18, 2021 FINDINGS: Cardiac and mediastinal contours are unchanged. Elevation of the left hemidiaphragm. Mild bibasilar opacities which likely represent atelectasis. Lungs otherwise clear. No large pleural effusion or pneumothorax. IMPRESSION: No active disease. Electronically Signed   By: Yetta Glassman M.D.   On: 12/26/2021 09:45    Assessment/Plan: 14 Days Post-Op   Principal Problem:   Closed hip fracture requiring operative repair, right, sequela Active Problems:   Chronic kidney disease (CKD) stage G3a/A1, moderately decreased glomerular filtration rate (GFR) between 45-59 mL/min/1.73 square meter and albuminuria creatinine ratio less than 30 mg/g (HCC)   COPD (chronic obstructive pulmonary disease) (HCC)   Coronary artery disease involving native coronary artery of native  heart   COVID-19 virus infection   Acute delirium   Superficial postoperative wound infection   CKD (chronic kidney disease), stage II   Dementia with behavioral disturbance  Patient is doing well postop.  Patient awaiting discharge to a skilled nursing facility.  We will likely remove staples tomorrow.  Continue physical and occupational therapy.  Continue Lovenox for DVT prophylaxis.  Can switch to enteric-coated aspirin 325 mg p.o. twice daily for DVT prophylaxis upon discharge x4 weeks.    Thornton Park , MD 12/27/2021, 2:28 PM

## 2021-12-28 LAB — HEMOGLOBIN AND HEMATOCRIT, BLOOD
HCT: 30.4 % — ABNORMAL LOW (ref 36.0–46.0)
Hemoglobin: 9.7 g/dL — ABNORMAL LOW (ref 12.0–15.0)

## 2021-12-28 NOTE — Progress Notes (Signed)
Physical Therapy Treatment Patient Details Name: Morgan Stewart MRN: [id number] DOB: [birth year] Today's Date: 12/28/2021   History of Present Illness Pt is a 76 y/o F admitted on 12/12/21 with c/c of an unwitnessed fall. Pt was found to have displaced R intertrochanteric hip fracture & underwent intramedullary fixation on 12/13/21 by Dr. Mack Guise. Pt also had subsequent acute rhabdomyolysis s/p fall. PMH: CAD, HTN, COPD, DJD, gout, dyslipidemia, vitamin D & B12 deficiencies, tobacco abuse    PT Comments    Pt received supine in bed with sitter present. Per NT pt on bed pan. Assisted in rolling R/L with minA. Able to transfer to seated EoB with minA and max multimodal cuing  for LE and UE sequencing. Able to stand with minguard with bed in lower position and tolerate ambulating 25' in room with minguard and intermittent cuing for RW sequencing with turning. Pt endorsing need for BM so seated on BSC in room. After 2-3 min pt endorsing discomfort and pain in R hip so returned to supine in bed with min guard for transfers and gait and modA on LE's to return to supine. Pt remains unsafe with mobility and impulsive with limited capacity to tolerate standing and ambulation beyond 25' due to pain and fatigue. D/c recs remain appropriate due to mentioned deficits.    Recommendations for follow up therapy are one component of a multi-disciplinary discharge planning process, led by the attending physician.  Recommendations may be updated based on patient status, additional functional criteria and insurance authorization.  Follow Up Recommendations  Skilled nursing-short term rehab (<3 hours/day)     Assistance Recommended at Discharge Frequent or constant Supervision/Assistance  Patient can return home with the following A lot of help with walking and/or transfers;A lot of help with bathing/dressing/bathroom;Assistance with cooking/housework;Assistance with feeding;Help with stairs or ramp for  entrance;Assist for transportation;Direct supervision/assist for medications management;Direct supervision/assist for financial management   Equipment Recommendations  Hospital bed;Wheelchair cushion (measurements PT);Wheelchair (measurements PT);BSC/3in1;Rolling walker (2 wheels)    Recommendations for Other Services       Precautions / Restrictions Precautions Precautions: Fall Restrictions Weight Bearing Restrictions: Yes RLE Weight Bearing: Weight bearing as tolerated     Mobility  Bed Mobility Overal bed mobility: Needs Assistance Bed Mobility: Supine to Sit;Sit to Supine Rolling: Min assist   Supine to sit: Min assist;HOB elevated Sit to supine: Mod assist   General bed mobility comments: for LE management Patient Response: Cooperative;Impulsive  Transfers Overall transfer level: Needs assistance Equipment used: Rolling walker (2 wheels) Transfers: Sit to/from Stand Sit to Stand: Min guard                Ambulation/Gait Ambulation/Gait assistance: Min guard Gait Distance (Feet): 25 Feet Assistive device: Rolling walker (2 wheels) Gait Pattern/deviations: Step-to pattern;Antalgic       General Gait Details: Requiring max cuing for safe turning with RW   Stairs             Wheelchair Mobility    Modified Rankin (Stroke Patients Only)       Balance Overall balance assessment: Needs assistance Sitting-balance support: Feet supported Sitting balance-Leahy Scale: Good     Standing balance support: Bilateral upper extremity supported;Reliant on assistive device for balance Standing balance-Leahy Scale: Fair Standing balance comment: Reliant on RW for support                            Cognition Arousal/Alertness: Awake/alert Behavior During Therapy: Impulsive;Flat affect  Overall Cognitive Status: History of cognitive impairments - at baseline                                          Exercises       General Comments        Pertinent Vitals/Pain Pain Assessment: Faces Faces Pain Scale: Hurts a little bit Pain Location: R hip Pain Descriptors / Indicators: Grimacing;Guarding Pain Intervention(s): Limited activity within patient's tolerance;Monitored during session;Repositioned    Home Living                          Prior Function            PT Goals (current goals can now be found in the care plan section) Acute Rehab PT Goals PT Goal Formulation: Patient unable to participate in goal setting    Frequency    Min 2X/week      PT Plan Current plan remains appropriate    Co-evaluation              AM-PAC PT "6 Clicks" Mobility   Outcome Measure  Help needed turning from your back to your side while in a flat bed without using bedrails?: A Lot Help needed moving from lying on your back to sitting on the side of a flat bed without using bedrails?: A Lot Help needed moving to and from a bed to a chair (including a wheelchair)?: A Lot Help needed standing up from a chair using your arms (e.g., wheelchair or bedside chair)?: A Lot Help needed to walk in hospital room?: A Lot Help needed climbing 3-5 steps with a railing? : Total 6 Click Score: 11    End of Session Equipment Utilized During Treatment: Gait belt Activity Tolerance: Patient tolerated treatment well Patient left: with call bell/phone within reach;with nursing/sitter in room;in bed;with bed alarm set Nurse Communication: Mobility status PT Visit Diagnosis: Unsteadiness on feet (R26.81);Muscle weakness (generalized) (M62.81);Difficulty in walking, not elsewhere classified (R26.2);Pain Pain - Right/Left: Right Pain - part of body: Hip     Time: 1515-1540 PT Time Calculation (min) (ACUTE ONLY): 25 min  Charges:  $Gait Training: 8-22 mins                    Salem Caster. Fairly IV, PT, DPT Physical Therapist- [facility]  12/28/2021, 3:59 PM

## 2021-12-28 NOTE — TOC Progression Note (Signed)
Transition of Care [facility]) - Progression Note    Patient Details  Name: Morgan Stewart MRN: [id number] Date of Birth: [birth year]  Transition of Care [facility]) CM/SW [place], RN Phone Number: 12/28/2021, 3:00 PM  Clinical Narrative:   Submitted Ins approval documents to [facility] for approval, ref number [id number], Ins approval Pending, plan to go to [facility], I reached out to [facility] to inquire if they will still accept the patient and Ins is pending, the patient has a Actuary and they are not able to accept until sitter free,     Expected Discharge Plan: [place] Barriers to Discharge: Continued Medical Work up  Expected Discharge Plan and Services Expected Discharge Plan: [place] arrangements for the past 2 months: Single Family Home Expected Discharge Date: 12/18/21                                     Social Determinants of Health (SDOH) Interventions    Readmission Risk Interventions Readmission Risk Prevention Plan 12/14/2021  Post Dischage Appt Complete  Medication Screening Complete  Transportation Screening Complete  Some recent data might be hidden

## 2021-12-28 NOTE — Progress Notes (Addendum)
Progress Note:    Morgan Stewart    DPO:[id number] DOB: [birth year] DOA: 12/12/2021  PCP: Lequita Asal, MD (Inactive)    Brief Narrative:   76 year old female with history of CAD, hypertension, COPD, DJD, gout, hyperlipidemia, vitamin D deficiency, vitamin B12 deficiency and tobacco abuse.  She had a fall and was found to have a right hip fracture.  Dr. Mack Guise took for operative repair on 12/13/2021 with intramedullary fixation of right intertrochanteric hip fracture.  Patient was found to be COVID-positive on 12/18/2021. Patient also had acute in-hospital delirium and trazodone was discontinued.    Subjective:   No acute overnight events reported by nursing staff. Now on room air. Awaiting to go to SNF once off isolation. Ate about 50% of her breakfast.   Assessment and Plan:   Acute delirium/encephalopathy: Resolved. Continue Seroquel and melatonin.  Trazodone was discontinued by previous provider due to concern for polypharmacy. Discontinue 1:1 sitter.   Acute hypoxic respiratory failure: Secondary to COVID-19 infection.  Currently on room air.  Remains in isolation through the 13th.   Superficial postoperative wound infection: Continue p.o. Keflex with planned stop tomorrow  Right hip fracture: Status post intramedullary fixation on 12/13/2021 by Dr. Mack Guise.  Working with PT has been able to ambulate in her room with them. Ortho recommends ASA 325mg  BID x 4 weeks for DVT prevention upon discharge. Can resume home ASA 81mg  daily thereafter.  COPD: Not in acute exacerbation.  On room air.  Chronic kidney disease stage II: Appears stable.  Rhabdomyolysis: Resolved with IV fluids.  CPK was initially 2439 and improved down to 342.  Home Crestor was discontinued due to this.  Can resume on discharge.  CAD: Status post PCI in 2004. Continue home aspirin 81 mg daily.  Home statin on hold due to reason mentioned above.  Hypertension: Home hydralazine on hold  since admission.  Can resume on discharge maybe at a lower dose or discontinue as she has been near normotensive here  Tobacco dependence: Counseled on smoking cessation.  Continue nicotine patch.   Other information:    DVT prophylaxis: Lovenox subcu Code Status: Full code Family Communication: No family present at bedside Disposition:   Status is: Inpatient  Remains inpatient appropriate because: Needs SNF rehab after hip repair but currently in [place] isolation through 1/13.    Consultants:   Orthopedics    Objective:    Vitals:   12/27/21 1900 12/28/21 0500 12/28/21 0759 12/28/21 1218  BP: 132/88 124/76 111/61 121/75  Pulse: 82 88 90 77  Resp: 18 18 18 18   Temp: 98.6 F (37 C) 98.4 F (36.9 C) 98.4 F (36.9 C) 98.3 F (36.8 C)  TempSrc:   Oral Oral  SpO2:  96% 94% 97%  Weight:      Height:        Intake/Output Summary (Last 24 hours) at 12/28/2021 1547 Last data filed at 12/28/2021 1000 Gross per 24 hour  Intake 120 ml  Output 650 ml  Net -530 ml    Filed Weights   12/12/21 1420  Weight: 72 kg       Physical Exam:    General exam: Appears calm and comfortable, more lively today. Respiratory system: Clear to auscultation. Respiratory effort normal. Cardiovascular system: S1 & S2 heard, RRR. No JVD, murmurs, rubs, gallops or clicks. No pedal edema. Gastrointestinal system: Abdomen is nondistended, soft and nontender. No organomegaly or masses felt. Normal bowel sounds heard. Central nervous system: Alert and oriented. No  focal neurological deficits. Extremities: Symmetric 5 x 5 power. Skin: No rashes, lesions or ulcers, right hip dressing clean dry and intact.  Neurovascularly intact.  Compartments are soft. Psychiatry: Judgement and insight appear normal. Mood & affect appropriate.     Data Reviewed:    I have personally reviewed following labs and imaging studies  CBC: Recent Labs  Lab 12/23/21 0517 12/26/21 0528 12/28/21 0558  WBC  7.5 4.7  --   HGB 9.6* 9.7* 9.7*  HCT 29.8* 30.9* 30.4*  MCV 94.6 96.9  --   PLT 156 164  --      Basic Metabolic Panel: Recent Labs  Lab 12/26/21 0528  NA 138  K 4.7  CL 102  CO2 31  GLUCOSE 97  BUN 11  CREATININE 0.86  CALCIUM 8.9     GFR: Estimated Creatinine Clearance: 57.5 mL/min (by C-G formula based on SCr of 0.86 mg/dL).  Liver Function Tests: No results for input(s): AST, ALT, ALKPHOS, BILITOT, PROT, ALBUMIN in the last 168 hours.  CBG: No results for input(s): GLUCAP in the last 168 hours.   No results found for this or any previous visit (from the past 240 hour(s)).        Radiology Studies:    No results found.      Medications:    Scheduled Meds:  aspirin EC  81 mg Oral Daily   cephALEXin  500 mg Oral Q6H   Chlorhexidine Gluconate Cloth  6 each Topical Daily   cholecalciferol  2,000 Units Oral Daily   enoxaparin (LOVENOX) injection  40 mg Subcutaneous Q24H   guaiFENesin  600 mg Oral BID   ipratropium  2 spray Nasal TID   melatonin  5 mg Oral QHS   nicotine  7 mg Transdermal Daily   polyethylene glycol  17 g Oral Daily   QUEtiapine  25 mg Oral QHS   senna  1 tablet Oral BID   Continuous Infusions:  sodium chloride 10 mL/hr at 12/23/21 0417   methocarbamol (ROBAXIN) IV         LOS: 16 days    Time spent: 35-minutes    Leslee Home, MD [facility]   To contact the attending provider between 7A-7P or the covering provider during after hours 7P-7A, please log into the web site [email or web address] and access using universal [place] password for that web site. If you do not have the password, please call the hospital operator.  12/28/2021, 3:47 PM

## 2021-12-28 NOTE — Progress Notes (Signed)
°  Subjective:  POD #15 s/p intramedullary fixation for right intertrochanteric hip fracture.   Patient has a history of dementia but reports no significant right hip pain.  Patient has a Actuary in the room.  She is sitting up eating lunch.  Objective:   VITALS:   Vitals:   12/27/21 1900 12/28/21 0500 12/28/21 0759 12/28/21 1218  BP: 132/88 124/76 111/61 121/75  Pulse: 82 88 90 77  Resp: 18 18 18 18   Temp: 98.6 F (37 C) 98.4 F (36.9 C) 98.4 F (36.9 C) 98.3 F (36.8 C)  TempSrc:   Oral Oral  SpO2:  96% 94% 97%  Weight:      Height:        PHYSICAL EXAM: Right lower extremity: I personally removed the patient's staples at the bedside today.  Steri-Strips were applied along with Aquacel dressings.  There is no drainage from the incisions.  There is no evidence of infection at the surgical sites. Neurovascular intact Sensation intact distally Intact pulses distally Dorsiflexion/Plantar flexion intact Incision: no drainage No cellulitis present Compartment soft  LABS  Results for orders placed or performed during the hospital encounter of 12/12/21 (from the past 24 hour(s))  Hemoglobin and hematocrit, blood     Status: Abnormal   Collection Time: 12/28/21  5:58 AM  Result Value Ref Range   Hemoglobin 9.7 (L) 12.0 - 15.0 g/dL   HCT 30.4 (L) 36.0 - 46.0 %    No results found.  Assessment/Plan: 15 Days Post-Op   Principal Problem:   Closed hip fracture requiring operative repair, right, sequela Active Problems:   Chronic kidney disease (CKD) stage G3a/A1, moderately decreased glomerular filtration rate (GFR) between 45-59 mL/min/1.73 square meter and albuminuria creatinine ratio less than 30 mg/g (HCC)   COPD (chronic obstructive pulmonary disease) (HCC)   Coronary artery disease involving native coronary artery of native heart   COVID-19 virus infection   Acute delirium   Superficial postoperative wound infection   CKD (chronic kidney disease), stage II   Dementia  with behavioral disturbance  Patient stable from an orthopedic standpoint.  Continue physical occupational therapy as the patient can tolerate.  Patient will need a skilled nursing facility upon discharge.  She is finishing up a 10-day quarantine for COVID.  Continue Lovenox for DVT prophylaxis while an inpatient.  Patient may be discharged on Lovenox or enteric-coated aspirin for DVT prophylaxis at SNF.  Patient will follow up in my office in 2 to 4 weeks after discharge from the hospital.    Thornton Park , MD 12/28/2021, 1:30 PM

## 2021-12-29 DIAGNOSIS — S72001D Fracture of unspecified part of neck of right femur, subsequent encounter for closed fracture with routine healing: Secondary | ICD-10-CM

## 2021-12-29 LAB — BASIC METABOLIC PANEL
Anion gap: 6 (ref 5–15)
BUN: 13 mg/dL (ref 8–23)
CO2: 31 mmol/L (ref 22–32)
Calcium: 9.1 mg/dL (ref 8.9–10.3)
Chloride: 103 mmol/L (ref 98–111)
Creatinine, Ser: 0.85 mg/dL (ref 0.44–1.00)
GFR, Estimated: >60 mL/min (ref >60)
Glucose, Bld: 92 mg/dL (ref 70–99)
Potassium: 4.3 mmol/L (ref 3.5–5.1)
Sodium: 140 mmol/L (ref 135–145)

## 2021-12-29 LAB — CBC
HCT: 30.2 % — ABNORMAL LOW (ref 36.0–46.0)
Hemoglobin: 9.7 g/dL — ABNORMAL LOW (ref 12.0–15.0)
MCH: 30.7 pg (ref 26.0–34.0)
MCHC: 32.1 g/dL (ref 30.0–36.0)
MCV: 95.6 fL (ref 80.0–100.0)
Platelets: 157 10*3/uL (ref 150–400)
RBC: 3.16 MIL/uL — ABNORMAL LOW (ref 3.87–5.11)
RDW: 15.5 % (ref 11.5–15.5)
WBC: 4 10*3/uL (ref 4.0–10.5)
nRBC: 0 % (ref 0.0–0.2)

## 2021-12-29 NOTE — Progress Notes (Addendum)
Progress Note:    Morgan Stewart    NTI:[id number] DOB: 1946-01-02 DOA: 12/12/2021  PCP: Lequita Asal, MD (Inactive)    Brief Narrative:   76 year old female with history of CAD, hypertension, COPD, DJD, gout, hyperlipidemia, vitamin D deficiency, vitamin B12 deficiency and tobacco abuse.  She had a fall and was found to have a right hip fracture.  Dr. Mack Guise took for operative repair on 12/13/2021 with intramedullary fixation of right intertrochanteric hip fracture.  Patient was found to be COVID-positive on 12/18/2021. Patient also had acute in-hospital delirium and trazodone was discontinued.    Subjective:   No acute overnight events reported by nursing staff. Now on room air. calm   Assessment and Plan:   Acute delirium/encephalopathy: Resolved. Continue Seroquel and melatonin.  Trazodone was discontinued by previous provider due to concern for polypharmacy. Sitter now discontinued for 24 hours   Acute hypoxic respiratory failure Covid infection: Secondary to COVID-19 infection.  Currently on room air. Off isolation  Superficial postoperative wound infection: Continue p.o. Keflex with planned stop today  Right hip fracture: Status post intramedullary fixation on 12/13/2021 by Dr. Mack Guise.  Working with PT has been able to ambulate in her room with them. Ortho recommends ASA or lovenox  x 4 weeks for DVT prevention upon discharge. F/u ortho 2 weeks. Toc says likely ok for snf acceptance on monday  COPD: Not in acute exacerbation.  On room air.  Chronic kidney disease stage II: Appears stable.  Rhabdomyolysis: Resolved with IV fluids.  CPK was initially 2439 and improved down to 342.  Home Crestor was discontinued due to this.  Can resume on discharge.  CAD: Status post PCI in 2004. Continue home aspirin 81 mg daily.  Home statin on hold due to reason mentioned above.  Hypertension: Home hydralazine on hold since admission.  Can resume on discharge maybe  at a lower dose or discontinue as she has been near normotensive here  Tobacco dependence: Counseled on smoking cessation.  Continue nicotine patch.   Other information:    DVT prophylaxis: Lovenox subcu Code Status: Full code Family Communication: No family present at bedside Disposition:   Status is: Inpatient  Remains inpatient appropriate because: unsafe d/c plan    Consultants:   Orthopedics    Objective:    Vitals:   12/28/21 1218 12/28/21 1702 12/28/21 1920 12/29/21 0848  BP: 121/75 131/87 112/66 112/77  Pulse: 77 80 83 71  Resp: 18 18 18 16   Temp: 98.3 F (36.8 C) 98.5 F (36.9 C) 98.7 F (37.1 C) 98.7 F (37.1 C)  TempSrc: Oral Oral    SpO2: 97% 91% (!) 89% 91%  Weight:      Height:        Intake/Output Summary (Last 24 hours) at 12/29/2021 1125 Last data filed at 12/29/2021 0913 Gross per 24 hour  Intake --  Output 500 ml  Net -500 ml   Filed Weights   12/12/21 1420  Weight: 72 kg       Physical Exam:    General exam: Appears calm and comfortable, Respiratory system: Clear to auscultation. Respiratory effort normal. Cardiovascular system: S1 & S2 heard, RRR. No JVD, murmurs, rubs, gallops or clicks. No pedal edema. Gastrointestinal system: Abdomen is nondistended, soft and nontender. No organomegaly or masses felt. Normal bowel sounds heard. Central nervous system: Alert and oriented. No focal neurological deficits. Extremities: Symmetric 5 x 5 power. Skin: No rashes, lesions or ulcers, right hip dressing clean dry and  intact.  Neurovascularly intact.  Compartments are soft. Psychiatry: Judgement and insight appear normal. Mood & affect appropriate.     Data Reviewed:    I have personally reviewed following labs and imaging studies  CBC: Recent Labs  Lab 12/23/21 0517 12/26/21 0528 12/28/21 0558 12/29/21 0535  WBC 7.5 4.7  --  4.0  HGB 9.6* 9.7* 9.7* 9.7*  HCT 29.8* 30.9* 30.4* 30.2*  MCV 94.6 96.9  --  95.6  PLT 156 164   --  850    Basic Metabolic Panel: Recent Labs  Lab 12/26/21 0528 12/29/21 0535  NA 138 140  K 4.7 4.3  CL 102 103  CO2 31 31  GLUCOSE 97 92  BUN 11 13  CREATININE 0.86 0.85  CALCIUM 8.9 9.1    GFR: Estimated Creatinine Clearance: 58.1 mL/min (by C-G formula based on SCr of 0.85 mg/dL).  Liver Function Tests: No results for input(s): AST, ALT, ALKPHOS, BILITOT, PROT, ALBUMIN in the last 168 hours.  CBG: No results for input(s): GLUCAP in the last 168 hours.   No results found for this or any previous visit (from the past 240 hour(s)).        Radiology Studies:    No results found.      Medications:    Scheduled Meds:  aspirin EC  81 mg Oral Daily   cephALEXin  500 mg Oral Q6H   Chlorhexidine Gluconate Cloth  6 each Topical Daily   cholecalciferol  2,000 Units Oral Daily   enoxaparin (LOVENOX) injection  40 mg Subcutaneous Q24H   guaiFENesin  600 mg Oral BID   ipratropium  2 spray Nasal TID   melatonin  5 mg Oral QHS   nicotine  7 mg Transdermal Daily   polyethylene glycol  17 g Oral Daily   QUEtiapine  25 mg Oral QHS   senna  1 tablet Oral BID   Continuous Infusions:  sodium chloride 10 mL/hr at 12/23/21 0417   methocarbamol (ROBAXIN) IV         LOS: 17 days    Time spent: 25-minutes    Desma Maxim, MD [facility]   To contact the attending provider between 7A-7P or the covering provider during after hours 7P-7A, please log into the web site [email or web address] and access using universal [place] password for that web site. If you do not have the password, please call the hospital operator.  12/29/2021, 11:25 AM

## 2021-12-29 NOTE — TOC Progression Note (Signed)
Transition of Care [facility]) - Progression Note    Patient Details  Name: Morgan Stewart MRN: [id number] Date of Birth: 13-Jun-1946  [facility]) CM/SW Contact  Izola Price, RN Phone Number: 12/29/2021, 11:36 AM  Clinical Narrative:  1/14: Sitter discontinued 12/28/21 at 1532. Haldol discontinued 12/28/21 at 1533. Simmie Davies RN CM     Expected Discharge Plan: Skilled Nursing Facility Barriers to Discharge: Continued Medical Work up  Expected Discharge Plan and Services Expected Discharge Plan: [place]       Living arrangements for the past 2 months: Single Family Home Expected Discharge Date: 12/18/21                                     Social Determinants of Health (SDOH) Interventions    Readmission Risk Interventions Readmission Risk Prevention Plan 12/14/2021  Post Dischage Appt Complete  Medication Screening Complete  Transportation Screening Complete  Some recent data might be hidden

## 2021-12-30 MED ORDER — POLYETHYLENE GLYCOL 3350 17 G PO PACK
17.0000 g | PACK | Freq: Every day | ORAL | Status: DC
  Administered 2021-12-31: 17 g via ORAL
  Filled 2021-12-30: qty 1

## 2021-12-30 MED ORDER — POLYETHYLENE GLYCOL 3350 17 G PO PACK: 17.0000 g | PACK | Freq: Two times a day (BID) | ORAL | Status: DC

## 2021-12-30 MED ORDER — LACTULOSE 10 GM/15ML PO SOLN: 20.0000 g | Freq: Three times a day (TID) | ORAL | Status: DC

## 2021-12-30 NOTE — Plan of Care (Signed)
°  Problem: Clinical Measurements: Goal: Ability to maintain clinical measurements within normal limits will improve Outcome: Progressing Goal: Will remain free from infection Outcome: Progressing Goal: Respiratory complications will improve Outcome: Progressing Goal: Cardiovascular complication will be avoided Outcome: Progressing   Problem: Nutrition: Goal: Adequate nutrition will be maintained Outcome: Progressing

## 2021-12-30 NOTE — TOC Progression Note (Signed)
Transition of Care [facility]) - Progression Note    Patient Details  Name: Morgan Stewart MRN: [id number] Date of Birth: 1946-04-07  Transition of Care [facility]) CM/SW [place], [place] Phone Number: 12/30/2021, 2:59 PM  Clinical Narrative:     CSW spoke with Attending w/ update on discharge plan.  Patient has bed offer at [facility], pending 48 hours w/out sitter or Haldol.  Plan is for d/c on 12/31/2021.   Expected Discharge Plan: Skilled Nursing Facility Barriers to Discharge: Continued Medical Work up  Expected Discharge Plan and Services Expected Discharge Plan: [place] arrangements for the past 2 months: Single Family Home Expected Discharge Date: 12/18/21                                     Social Determinants of Health (SDOH) Interventions    Readmission Risk Interventions Readmission Risk Prevention Plan 12/14/2021  Post Dischage Appt Complete  Medication Screening Complete  Transportation Screening Complete  Some recent data might be hidden

## 2021-12-30 NOTE — Plan of Care (Signed)

## 2021-12-30 NOTE — Progress Notes (Signed)
[place] at [place] NAME: Avriana Joo    MR#:  [id number]  DATE OF BIRTH:  1946/11/09  SUBJECTIVE:  resting quietly. No new issues per RN. No family at bedside REVIEW OF SYSTEMS:   Review of Systems  Constitutional:  Negative for chills, fever and weight loss.  HENT:  Negative for ear discharge, ear pain and nosebleeds.   Eyes:  Negative for blurred vision, pain and discharge.  Respiratory:  Negative for sputum production, shortness of breath, wheezing and stridor.   Cardiovascular:  Negative for chest pain, palpitations, orthopnea and PND.  Gastrointestinal:  Negative for abdominal pain, diarrhea, nausea and vomiting.  Genitourinary:  Negative for frequency and urgency.  Musculoskeletal:  Negative for back pain and joint pain.  Neurological:  Negative for sensory change, speech change, focal weakness and weakness.  Psychiatric/Behavioral:  Negative for depression and hallucinations. The patient is not nervous/anxious.   Tolerating Diet: yes Tolerating PT: rehab  DRUG ALLERGIES:   Allergies  Allergen Reactions   Ace Inhibitors Hives, Other (See Comments) and Rash    Pt is not sure of reaction  Pt is not sure of reaction     Doxycycline Other (See Comments)    Pt is not sure of reaction  Pt is not sure of reaction     Codeine Nausea Only   Orlistat Rash and Other (See Comments)    Pt is not sure of reaction  Pt is not sure of reaction     Oxycodone Nausea And Vomiting   Oxycodone-Acetaminophen Nausea Only   Sulfa Antibiotics Other (See Comments), Rash and Hives    Lowering serum sodium levels Lowering serum sodium levels  Lowering serum sodium levels Lowering serum sodium levels    VITALS:  Blood pressure 135/80, pulse 71, temperature 98.3 F (36.8 C), resp. rate 18, height 5\' 6"  (1.676 m), weight 72 kg, SpO2 92 %.  PHYSICAL EXAMINATION:   Physical Exam  GENERAL:  76 y.o.-year-old patient lying in the bed  with no acute distress.  HEENT: Head atraumatic, normocephalic. Oropharynx and nasopharynx clear.  LUNGS: Normal breath sounds bilaterally, no wheezing, rales, rhonchi.  CARDIOVASCULAR: S1, S2 normal. No murmurs, rubs, or gallops.  ABDOMEN: Soft, nontender, nondistended. Bowel sounds present.  EXTREMITIES: No  edema b/l.   Right hip surgery incision okay NEUROLOGIC: nonfocal PSYCHIATRIC:  patient is alert SKIN: No obvious rash, lesion, or ulcer.   LABORATORY PANEL:  CBC Recent Labs  Lab 12/29/21 0535  WBC 4.0  HGB 9.7*  HCT 30.2*  PLT 157    Chemistries  Recent Labs  Lab 12/29/21 0535  NA 140  K 4.3  CL 103  CO2 31  GLUCOSE 92  BUN 13  CREATININE 0.85  CALCIUM 9.1   Cardiac Enzymes No results for input(s): TROPONINI in the last 168 hours. RADIOLOGY:  No results found. ASSESSMENT AND PLAN:   76 year old female with history of CAD, hypertension, COPD, DJD, gout, hyperlipidemia, vitamin D deficiency, vitamin B12 deficiency and tobacco abuse.  She had a fall and was found to have a right hip fracture.  Dr. Mack Guise took for operative repair on 12/13/2021 with intramedullary fixation of right intertrochanteric hip fracture.  Patient was found to be COVID-positive on 12/18/2021. Patient also had acute in-hospital delirium .  Acute delirium/encephalopathy: Resolved.  --Continue Seroquel and melatonin.     Acute hypoxic respiratory failure Covid infection: Secondary to COVID-19 infection.   --Currently on room air. Off isolation  Superficial postoperative wound infection: completed po  Keflex    Right hip fracture: Status post intramedullary fixation on 12/13/2021 by Dr. Mack Guise.  --Working with PT has been able to ambulate in her room with them.  --Ortho recommends ASA or lovenox  x 4 weeks for DVT prevention upon discharge. --F/u ortho 2 weeks.    COPD: Not in acute exacerbation.  On room air.   Chronic kidney disease stage II: Appears stable.   Rhabdomyolysis:  Resolved with IV fluids.    CAD: Status post PCI in 2004. Continue home aspirin 81 mg daily.   Resume statins   Hypertension: Home hydralazine on hold since admission since  she has been near normotensive here   Tobacco dependence: Continue nicotine patch.    Procedures:right hip sx Family communication :none today Consults : ortho CODE STATUS: full DVT Prophylaxis : Lovenox Level of care: Med-Surg Status is: Inpatient  Remains inpatient appropriate because: patient will discharged tomorrow to rehab per [facility] information. She completed her 10 day isolation for positive COVID      TOTAL TIME TAKING CARE OF THIS PATIENT: 20 minutes.  >50% time spent on counselling and coordination of care  Note: This dictation was prepared with Dragon dictation along with smaller phrase technology. Any transcriptional errors that result from this process are unintentional.  Fritzi Mandes M.D    [facility]   CC: Primary care physician; Lequita Asal, MD (Inactive) Patient ID: Marylee Floras, female   DOB: Dec 14, 1946, 76 y.o.   MRN: [id number]

## 2021-12-31 MED ORDER — ASPIRIN 81 MG PO TBEC: 81.0000 mg | DELAYED_RELEASE_TABLET | Freq: Every day | ORAL | 11 refills | Status: AC

## 2021-12-31 MED ORDER — ENOXAPARIN SODIUM 40 MG/0.4ML IJ SOSY: 40.0000 mg | PREFILLED_SYRINGE | INTRAMUSCULAR | 0 refills | Status: AC

## 2021-12-31 MED ORDER — HYDROCODONE-ACETAMINOPHEN 5-325 MG PO TABS: 1.0000 | ORAL_TABLET | Freq: Four times a day (QID) | ORAL | 0 refills | Status: AC | PRN

## 2021-12-31 MED ORDER — ENOXAPARIN SODIUM 40 MG/0.4ML IJ SOSY
40.0000 mg | PREFILLED_SYRINGE | INTRAMUSCULAR | 0 refills | Status: DC
Start: 2021-12-31 — End: 2021-12-31

## 2021-12-31 NOTE — Discharge Summary (Signed)
[place] at [place] NAME: Morgan Stewart    MR#:  [id number]  DATE OF BIRTH:  December 27, 1945  DATE OF ADMISSION:  12/12/2021 ADMITTING PHYSICIAN: Christel Mormon, MD  DATE OF DISCHARGE: 12/31/2021  PRIMARY CARE PHYSICIAN: Lequita Asal, MD (Inactive)    ADMISSION DIAGNOSIS:  Surgery, elective [Z41.9] Closed right hip fracture ([place]) [S72.001A] Closed displaced intertrochanteric fracture of right femur, initial encounter ([place]) [S72.141A] Traumatic rhabdomyolysis, initial encounter ([place]) [T79.6XXA]  DISCHARGE DIAGNOSIS:  closed displaced into trochanteric fracture right femur status post surgery  SECONDARY DIAGNOSIS:   Past Medical History:  Diagnosis Date   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  She had a fall and was found to have a right hip fracture.  Dr. Mack Guise took for operative repair on 12/13/2021 with intramedullary fixation of right intertrochanteric hip fracture.  Patient was found to be COVID-positive on 12/18/2021. Patient also had acute in-hospital delirium .   Acute delirium/encephalopathy: Resolved.  --Continue Seroquel and melatonin.     Acute hypoxic respiratory failure-- resolved Covid infection: Secondary to COVID-19 infection.   --Currently on room air. Off isolation   Superficial postoperative wound infection: completed po  Keflex    Right hip fracture: Status post intramedullary fixation on 12/13/2021 by Dr. Mack Guise.  --Working with PT has been able to ambulate in her room with them.  --Ortho recommends ASA or lovenox  x 4 weeks for DVT prevention upon discharge. --F/u ortho 2 weeks.  --rxed lovenox for 10 more days   COPD: Not in acute exacerbation.  On room air.   Chronic kidney disease stage II: Appears stable.   Rhabdomyolysis: Resolved  with IV fluids.    CAD: Status post PCI in 2004. Continue home aspirin 81 mg daily.   Resume statins   Hypertension:  Hydralazine bid  Tobacco dependence: Continue nicotine patch.     Procedures:right hip sx Family communication :son Gerald Stabs on the phone Consults : ortho CODE STATUS: full DVT Prophylaxis : Lovenox Level of care: Med-Surg Status is: Inpatient     CONSULTS OBTAINED:  Treatment Team:  Thornton [place], MD  DRUG ALLERGIES:   Allergies  Allergen Reactions   Ace Inhibitors Hives, Other (See Comments) and Rash    Pt is not sure of reaction  Pt is not sure of reaction     Doxycycline Other (See Comments)    Pt is not sure of reaction  Pt is not sure of reaction     Codeine Nausea Only   Orlistat Rash and Other (See Comments)    Pt is not sure of reaction  Pt is not sure of reaction     Oxycodone Nausea And Vomiting   Oxycodone-Acetaminophen Nausea Only   Sulfa Antibiotics Other (See Comments), Rash and Hives    Lowering serum sodium levels Lowering serum sodium levels  Lowering serum sodium levels Lowering serum sodium levels    DISCHARGE MEDICATIONS:   Allergies as of 12/31/2021       Reactions   Ace Inhibitors Hives, Other (See Comments), Rash   Pt is not sure of reaction  Pt is not sure of reaction    Doxycycline Other (See Comments)   Pt is not sure of reaction  Pt is not sure of reaction    Codeine Nausea Only   Orlistat Rash, Other (  See Comments)   Pt is not sure of reaction  Pt is not sure of reaction    Oxycodone Nausea And Vomiting   Oxycodone-acetaminophen Nausea Only   Sulfa Antibiotics Other (See Comments), Rash, Hives   Lowering serum sodium levels Lowering serum sodium levels Lowering serum sodium levels Lowering serum sodium levels        Medication List     STOP taking these medications    ibuprofen 600 MG tablet Commonly known as: ADVIL       TAKE these medications    aspirin 81 MG EC tablet Take 1  tablet (81 mg total) by mouth daily. Swallow whole.   bisacodyl 10 MG suppository Commonly known as: DULCOLAX Place 1 suppository (10 mg total) rectally daily as needed for moderate constipation.   Cholecalciferol 50 MCG (2000 UT) Caps Take by mouth.   DeVilbiss Disposable Nebulizer Misc Use nebulizer every 6 hours as needed for wheezing.   docusate sodium 100 MG capsule Commonly known as: COLACE Take 1 capsule (100 mg total) by mouth 2 (two) times daily.   enoxaparin 40 MG/0.4ML injection Commonly known as: LOVENOX Inject 0.4 mLs (40 mg total) into the skin daily for 10 days.   hydrALAZINE 25 MG tablet Commonly known as: APRESOLINE Take 25 mg by mouth 2 (two) times daily.   HYDROcodone-acetaminophen 5-325 MG tablet Commonly known as: NORCO/VICODIN Take 1-2 tablets by mouth every 6 (six) hours as needed for moderate pain.   ipratropium 0.06 % nasal spray Commonly known as: ATROVENT Place 2 sprays into the nose 3 (three) times daily. What changed:  how much to take when to take this   melatonin 1 MG Tabs tablet Take by mouth.   methocarbamol 500 MG tablet Commonly known as: ROBAXIN Take 1 tablet (500 mg total) by mouth every 6 (six) hours as needed for muscle spasms.   polyethylene glycol 17 g packet Commonly known as: MIRALAX / GLYCOLAX Take 17 g by mouth daily as needed for mild constipation.   rosuvastatin 40 MG tablet Commonly known as: CRESTOR Take 40 mg by mouth daily.   senna 8.6 MG Tabs tablet Commonly known as: SENOKOT Take 1 tablet (8.6 mg total) by mouth 2 (two) times daily.   traZODone 50 MG tablet Commonly known as: DESYREL Take 50-100 mg by mouth at bedtime as needed.   venlafaxine XR 150 MG 24 hr capsule Commonly known as: EFFEXOR-XR Take by mouth.               Discharge Care Instructions  (From admission, onward)           Start     Ordered   12/31/21 0000  Discharge wound care:       Comments: Reinforce dressing until  discontinued   12/31/21 3559            If you experience worsening of your admission symptoms, develop shortness of breath, life threatening emergency, suicidal or homicidal thoughts you must seek medical attention immediately by calling 911 or calling your MD immediately  if symptoms less severe.  You Must read complete instructions/literature along with all the possible adverse reactions/side effects for all the Medicines you take and that have been prescribed to you. Take any new Medicines after you have completely understood and accept all the possible adverse reactions/side effects.   Please note  You were cared for by a hospitalist during your hospital stay. If you have any questions about your discharge medications or the care  you received while you were in the hospital after you are discharged, you can call the unit and asked to speak with the hospitalist on call if the hospitalist that took care of you is not available. Once you are discharged, your primary care physician will handle any further medical issues. Please note that NO REFILLS for any discharge medications will be authorized once you are discharged, as it is imperative that you return to your primary care physician (or establish a relationship with a primary care physician if you do not have one) for your aftercare needs so that they can reassess your need for medications and monitor your lab values. Today   SUBJECTIVE  no new complaints. Resting. No new issues per   VITAL SIGNS:  Blood pressure (!) 144/83, pulse 76, temperature 97.9 F (36.6 C), resp. rate 17, height 5\' 6"  (1.676 m), weight 72 kg, SpO2 97 %.  I/O:   Intake/Output Summary (Last 24 hours) at 12/31/2021 0845 Last data filed at 12/31/2021 1027 Gross per 24 hour  Intake 240 ml  Output 825 ml  Net -585 ml    PHYSICAL EXAMINATION:  GENERAL:  76 y.o.-year-old patient lying in the bed with no acute distress.  LUNGS: Normal breath sounds bilaterally,  no wheezing, rales,rhonchi or crepitation.  CARDIOVASCULAR: S1, S2 normal. No murmurs, rubs, or gallops.  ABDOMEN: Soft, non-tender, non-distended. Bowel sounds present.  EXTREMITIES: No  clubbing.  NEUROLOGIC: non-focal PSYCHIATRIC:  patient is alert  SKIN: No obvious rash, lesion, or ulcer.   DATA REVIEW:   CBC  Recent Labs  Lab 12/29/21 0535  WBC 4.0  HGB 9.7*  HCT 30.2*  PLT 157    Chemistries  Recent Labs  Lab 12/29/21 0535  NA 140  K 4.3  CL 103  CO2 31  GLUCOSE 92  BUN 13  CREATININE 0.85  CALCIUM 9.1    Microbiology Results   No results found for this or any previous visit (from the past 240 hour(s)).  RADIOLOGY:  No results found.   CODE STATUS:     Code Status Orders  (From admission, onward)           Start     Ordered   12/12/21 2318  Full code  Continuous        12/13/21 0036           Code Status History     This patient has a current code status but no historical code status.        TOTAL TIME TAKING CARE OF THIS PATIENT: 40 minutes.    Fritzi Mandes M.D  [facility]    CC: Primary care physician; Lequita Asal, MD (Inactive)

## 2021-12-31 NOTE — Progress Notes (Signed)
Attempted multiple times to call report to [facility].Prior to patient discharge and once EMS arrived for pick up.

## 2021-12-31 NOTE — Discharge Instructions (Signed)
Patient will follow-up with her PCP after discharge from rehab.

## 2021-12-31 NOTE — TOC Progression Note (Addendum)
Transition of Care [facility]) - Progression Note    Patient Details  Name: Morgan Stewart MRN: [id number] Date of Birth: May 24, 1946  Transition of Care [facility]) CM/SW [place], [place] Phone Number: 12/31/2021, 10:12 AM  Clinical Narrative:    Received notification of Ins approval [id number] 1/14-1/18 going to [facility] to [place] the patients son, provided the room number and that IM calling EMS  I called EMS to transport there is 1 ahead of her   Expected Discharge Plan: [place] Barriers to Discharge: Continued Medical Work up  Expected Discharge Plan and Services Expected Discharge Plan: [place] arrangements for the past 2 months: Single Family Home Expected Discharge Date: 12/31/21                                     Social Determinants of Health (SDOH) Interventions    Readmission Risk Interventions Readmission Risk Prevention Plan 12/14/2021  Post Dischage Appt Complete  Medication Screening Complete  Transportation Screening Complete  Some recent data might be hidden

## 2022-01-01 ENCOUNTER — Encounter: Payer: Self-pay | Admitting: Hematology and Oncology

## 2022-01-17 ENCOUNTER — Non-Acute Institutional Stay: Payer: Medicare HMO | Admitting: Student

## 2022-01-17 ENCOUNTER — Other Ambulatory Visit: Payer: Self-pay

## 2022-01-17 DIAGNOSIS — Z515 Encounter for palliative care: Secondary | ICD-10-CM

## 2022-01-17 DIAGNOSIS — S72001S Fracture of unspecified part of neck of right femur, sequela: Secondary | ICD-10-CM

## 2022-01-17 DIAGNOSIS — F03A0 Unspecified dementia, mild, without behavioral disturbance, psychotic disturbance, mood disturbance, and anxiety: Secondary | ICD-10-CM

## 2022-01-17 DIAGNOSIS — R52 Pain, unspecified: Secondary | ICD-10-CM

## 2022-01-17 NOTE — Progress Notes (Signed)
[facility] Consult Note Telephone: [phone]  Fax: [phone]   Date of encounter: 01/17/22 10:50 AM PATIENT NAME: Morgan Stewart [address] [place] [address]   [id number] (home)  DOB: [birth year] MRN: [id number] PRIMARY CARE PROVIDER:    Vilinda Boehringer, NP  REFERRING PROVIDER:   Vilinda Boehringer, NP  RESPONSIBLE PARTY:    Contact Information     Name Relation Home Work Mobile   Ivey,Chris (call first) Son   [phone]   Ellis,[name] Niece   [phone]   Monia Sabal [phone]          I met face to face with patient and family in the facility. Palliative Care was asked to follow this patient by consultation request of  Morgan Boehringer, NP to address advance care planning and complex medical decision making. This is the initial visit.                                     ASSESSMENT AND PLAN / RECOMMENDATIONS:   Advance Care Planning/Goals of Care: Goals include to maximize quality of life and symptom management. Patient/health care surrogate gave his/her permission to discuss.Our advance care planning conversation included a discussion about:    The value and importance of advance care planning  Experiences with loved ones who have been seriously ill or have died  Exploration of personal, cultural or spiritual beliefs that might influence medical decisions  Exploration of goals of care in the event of a sudden injury or illness  CODE STATUS: Full Code  Patient will be moving with son Morgan Stewart, who resides in [place]. Education provided on palliative medicine vs. Hospice services. He is interested in palliative program in his area, if available. HH ordered upon discharge per facility.   Symptom Management/Plan:  Closed displaced intertrochanteric fracture of right femur with repair-patient will be discharging home with her son Morgan Stewart today. She will continue to receive [facility] therapy.    Pain-secondary to hip fracture, DJD. Pain managed currently; staff asked to administer acetaminophen during visit d/t kee pain.  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 Monitor for falls/safety. Assist with adl's as needed.   Follow up Palliative Care Visit: Palliative care will continue to follow for complex medical decision making, advance care planning, and clarification of goals.   This visit was coded based on medical decision making (MDM).  PPS: 40%  HOSPICE ELIGIBILITY/DIAGNOSIS: TBD  Chief Complaint: Palliative Medicine initial consult.  HISTORY OF PRESENT ILLNESS:  Morgan Stewart is a 76 y.o. year old female  with Patient recently hospitalized 12/12/21 through 12/31/2021 due to close right hip fracture, closed displaced intra cantering fracture of right femur, traumatic rhabdomyolysis. Patient also tested positive for Covid-19 infection on 12/18/21 during hospital stay. Diagnosis also include CAD, hypertension, COPD, DJD, gout, hyperlipidemia, vitamin D deficiency, vitamin B12 deficiency, hx of cervical cancer.   Patient currently at [facility]. She has been here for rehab; son Morgan Stewart arrived during visit. She will be discharging home with him and his wife. She had previously lived at home alone in [place]. She does report right knee pain today; staff asked to medicate as she will be in car for 4 hours. She denies shortness of breath. Endorses good appetite. She is ambulatory with walker; w/c used for locomotion. She is sleeping okay. A 10-point review of systems is negative, except for  the pertinent positives and negatives detailed in the HPI.    History obtained from review of EMR, discussion with primary team, and interview with family, facility staff/caregiver and/or Morgan Stewart.  I reviewed available labs, medications, imaging, studies and related documents from the EMR.  Records reviewed and summarized above.    Physical Exam: Pulse  92, resp 16, sats 95% on room air Constitutional: NAD General: frail appearing, thin EYES: anicteric sclera, lids intact, no discharge  ENMT: intact hearing, oral mucous membranes moist, dentition intact CV: S1S2, RRR, no LE edema Pulmonary: LCTA, no increased work of breathing, no cough, room air Abdomen: normo-active BS + 4 quadrants, soft and non tender, no ascites GU: deferred MSK: moves all extremities, ambulatory with walker Skin: warm and dry, no rashes or wounds on visible skin Neuro:  generalized weakness, A & O x 3, forgetful Psych: non-anxious affect, pleasant Hem/lymph/immuno: no widespread bruising CURRENT PROBLEM LIST:  Patient Active Problem List   Diagnosis Date Noted   CKD (chronic kidney disease), stage II    Dementia with behavioral disturbance    Superficial postoperative wound infection 12/23/2021   Acute delirium 12/22/2021   Hypoxia    Traumatic rhabdomyolysis ([place])    COVID-19 virus infection    Closed hip fracture requiring operative repair, right, sequela 12/12/2021   Smoking history 07/17/2020   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   Colon cancer screening 02/10/2020   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   Thrombocytopenia ([place]) 11/26/2019   COPD (chronic obstructive pulmonary disease) ([place]) 11/26/2019   Gout 11/26/2019   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   LVH (left ventricular hypertrophy) 05/06/2019   Palpitations 05/03/2019   Chronic kidney disease (CKD) stage G3a/A1, moderately decreased glomerular filtration rate (GFR) between 45-59 mL/min/1.73 square meter and albuminuria creatinine ratio less than 30 mg/g (HCC) 04/07/2019   Vitamin D deficiency 04/07/2019   Low vitamin B12 level 02/03/2019   Left hip pain 08/11/2018   Radicular pain of lumbosacral region 08/11/2018   Low vitamin D level 08/03/2018   Opacity of lung on imaging study 10/18/2015   Microscopic hematuria 03/08/2014   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 09/10/2012   Hallux valgus with  bunions 09/10/2012   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   Hyperlipidemia 10/14/2011   Coronary artery disease involving native coronary artery of native heart 07/09/2011   Essential (primary) hypertension 07/09/2011   PAST MEDICAL HISTORY:  Active Ambulatory Problems    Diagnosis Date Noted   Thrombocytopenia ([place]) 11/26/2019   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   Chronic kidney disease (CKD) stage G3a/A1, moderately decreased glomerular filtration rate (GFR) between 45-59 mL/min/1.73 square meter and albuminuria creatinine ratio less than 30 mg/g (HCC) 04/07/2019   COPD (chronic obstructive pulmonary disease) ([place]) 11/26/2019   Coronary artery disease involving native coronary artery of native heart 07/09/2011   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 09/10/2012   Essential (primary) hypertension 07/09/2011   Gout 11/26/2019   Hallux valgus with bunions 09/10/2012   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   Hyperlipidemia 10/14/2011   Left hip pain 08/11/2018   LVH (left ventricular hypertrophy) 05/06/2019   Microscopic hematuria 03/08/2014   Opacity of lung on imaging study 10/18/2015   Palpitations 05/03/2019   Radicular pain of lumbosacral region 08/11/2018   Vitamin D deficiency 04/07/2019   Low vitamin D level 08/03/2018   Low vitamin B12 level 02/03/2019   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   Smoking history 07/17/2020   Colon cancer screening 02/10/2020   Closed hip  fracture requiring operative repair, right, sequela 12/12/2021   Hypoxia    Traumatic rhabdomyolysis ([place])    COVID-19 virus infection    Acute delirium 12/22/2021   Superficial postoperative wound infection 12/23/2021   CKD (chronic kidney disease), stage II    Dementia with behavioral disturbance    Resolved Ambulatory Problems    Diagnosis Date Noted   No Resolved Ambulatory Problems   Past Medical History:  Diagnosis Date   Cancer ([place])    SOCIAL HX:  Social History   Tobacco Use   Smoking  status: Every Day    Packs/day: 1.00    Years: 55.00    Pack years: 55.00    Types: Cigarettes   Smokeless tobacco: Never   Tobacco comments:    .5ppd currently  Substance Use Topics   Alcohol use: Not on file   FAMILY HX:  Family History  Problem Relation Age of Onset   Breast cancer Sister 50      ALLERGIES:  Allergies  Allergen Reactions   Ace Inhibitors Hives, Other (See Comments) and Rash    Pt is not sure of reaction  Pt is not sure of reaction     Doxycycline Other (See Comments)    Pt is not sure of reaction  Pt is not sure of reaction     Codeine Nausea Only   Orlistat Rash and Other (See Comments)    Pt is not sure of reaction  Pt is not sure of reaction     Oxycodone Nausea And Vomiting   Oxycodone-Acetaminophen Nausea Only   Sulfa Antibiotics Other (See Comments), Rash and Hives    Lowering serum sodium levels Lowering serum sodium levels  Lowering serum sodium levels Lowering serum sodium levels     PERTINENT MEDICATIONS:  Outpatient Encounter Medications as of 01/17/2022  Medication Sig   aspirin EC 81 MG EC tablet Take 1 tablet (81 mg total) by mouth daily. Swallow whole.   bisacodyl (DULCOLAX) 10 MG suppository Place 1 suppository (10 mg total) rectally daily as needed for moderate constipation.   Cholecalciferol 50 MCG (2000 UT) CAPS Take by mouth. (Patient not taking: Reported on 12/13/2021)   docusate sodium (COLACE) 100 MG capsule Take 1 capsule (100 mg total) by mouth 2 (two) times daily.   enoxaparin (LOVENOX) 40 MG/0.4ML injection Inject 0.4 mLs (40 mg total) into the skin daily for 10 days.   hydrALAZINE (APRESOLINE) 25 MG tablet Take 25 mg by mouth 2 (two) times daily.   HYDROcodone-acetaminophen (NORCO/VICODIN) 5-325 MG tablet Take 1-2 tablets by mouth every 6 (six) hours as needed for moderate pain.   ipratropium (ATROVENT) 0.06 % nasal spray Place 2 sprays into the nose 3 (three) times daily.   Melatonin 1 MG TABS Take by mouth. (Patient  not taking: Reported on 12/13/2021)   methocarbamol (ROBAXIN) 500 MG tablet Take 1 tablet (500 mg total) by mouth every 6 (six) hours as needed for muscle spasms.   Nebulizers (DEVILBISS DISPOSABLE NEBULIZER) MISC Use nebulizer every 6 hours as needed for wheezing.   polyethylene glycol (MIRALAX / GLYCOLAX) 17 g packet Take 17 g by mouth daily as needed for mild constipation.   rosuvastatin (CRESTOR) 40 MG tablet Take 40 mg by mouth daily.   senna (SENOKOT) 8.6 MG TABS tablet Take 1 tablet (8.6 mg total) by mouth 2 (two) times daily.   traZODone (DESYREL) 50 MG tablet Take 50-100 mg by mouth at bedtime as needed.   venlafaxine XR (EFFEXOR-XR) 150 MG 24  hr capsule Take by mouth.   No facility-administered encounter medications on file as of 01/17/2022.   Thank you for the opportunity to participate in the care of Morgan Stewart.  The palliative care team will continue to follow. Please call our office at [phone] if we can be of additional assistance.   Ezekiel Slocumb, NP   COVID-19 PATIENT SCREENING TOOL Asked and negative response unless otherwise noted:  Have you had symptoms of covid, tested positive or been in contact with someone with symptoms/positive test in the past 5-10 days? No

## 2022-01-21 ENCOUNTER — Inpatient Hospital Stay: Payer: Commercial Managed Care - PPO

## 2022-02-18 ENCOUNTER — Inpatient Hospital Stay: Payer: Commercial Managed Care - PPO

## 2022-03-21 ENCOUNTER — Ambulatory Visit: Payer: Commercial Managed Care - PPO | Admitting: Oncology

## 2022-03-21 ENCOUNTER — Ambulatory Visit: Payer: Commercial Managed Care - PPO

## 2022-03-21 ENCOUNTER — Other Ambulatory Visit: Payer: Commercial Managed Care - PPO

## 2022-09-24 ENCOUNTER — Telehealth: Payer: Self-pay | Admitting: *Deleted

## 2022-09-24 NOTE — Telephone Encounter (Signed)
Spoke with Shon Hale (pt.'s son) per chart. Pt. Lives in an assisted living facility in [place] now per son. Stated he would pass on the information that she is due a LCS CT scan follow up.

## 2023-06-12 IMAGING — RF DG HIP (WITH OR WITHOUT PELVIS) 2-3V*R*
1 series · 7 of 7 positions shown · non-contrast
Comparison: Fracture of the RIGHT hip.

CLINICAL DATA: Intramedullary nail.

EXAM:
DG HIP (WITH OR WITHOUT PELVIS) 2-3V RIGHT; DG C-ARM 1-60 MIN-NO
REPORT

[Series 1: dg x-ray · 0.20mm/px · 7 of 7 slices shown]
[im 1/7]
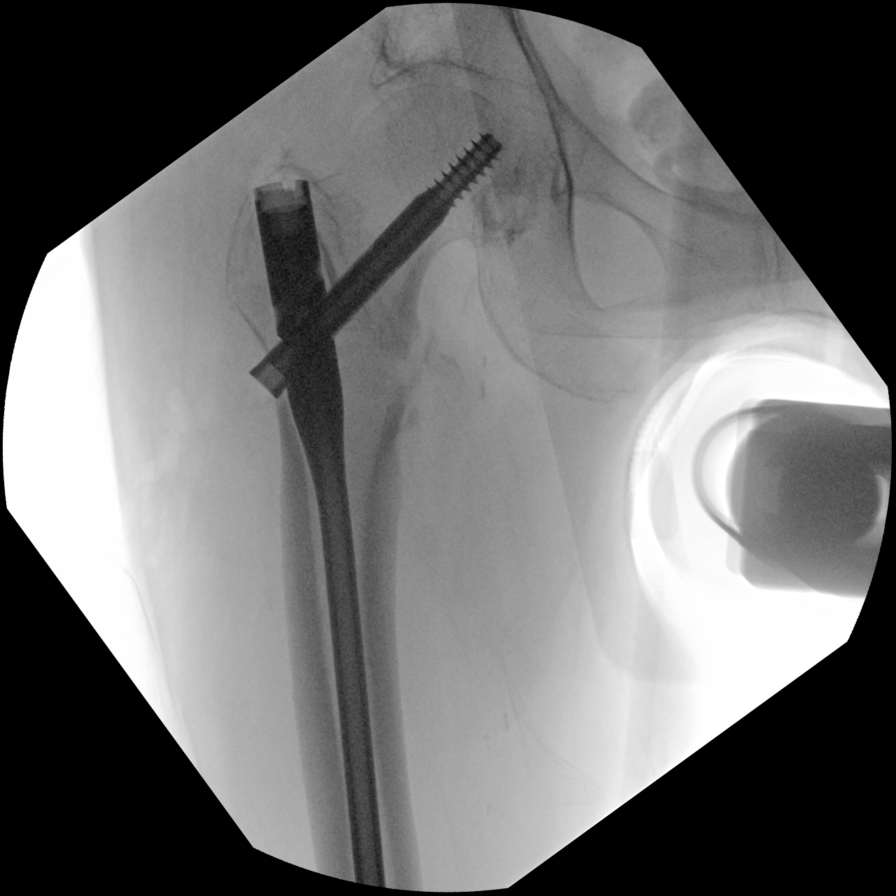
[im 2/7]
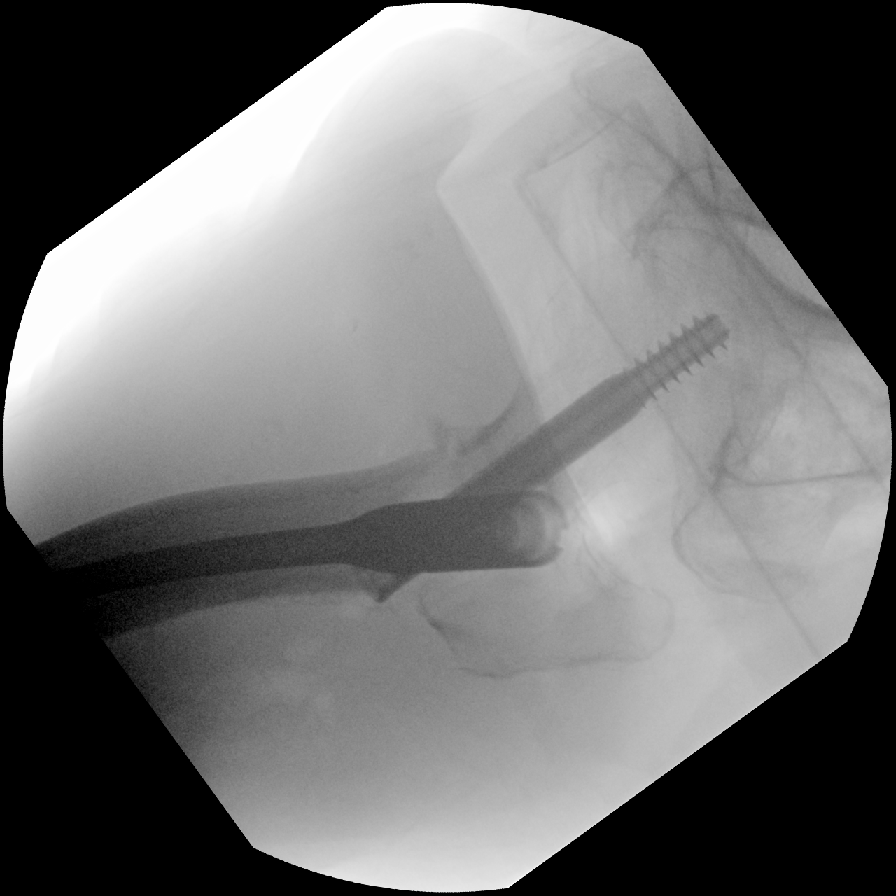
[im 3/7]
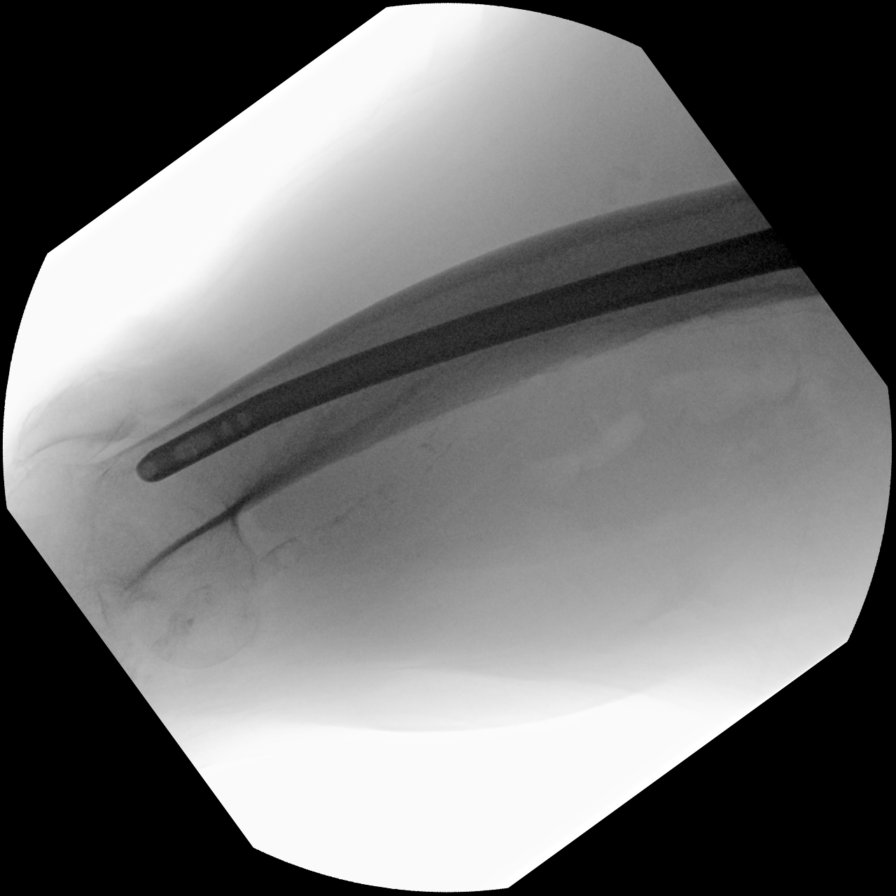
[im 4/7]
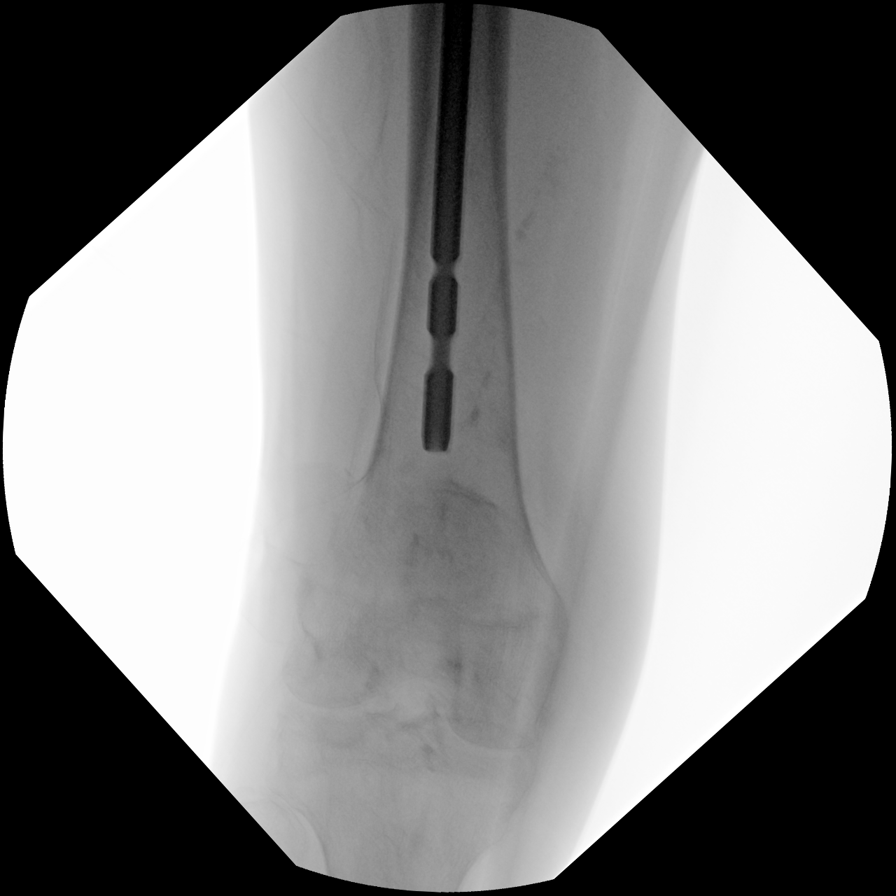
[im 5/7]
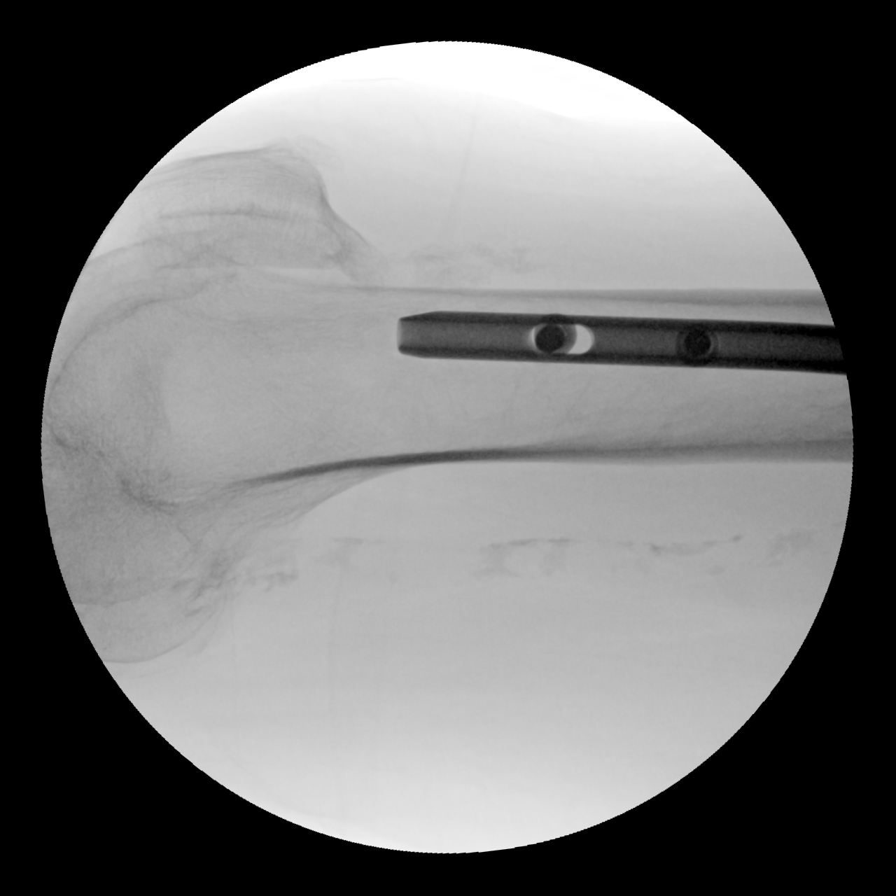
[im 6/7]
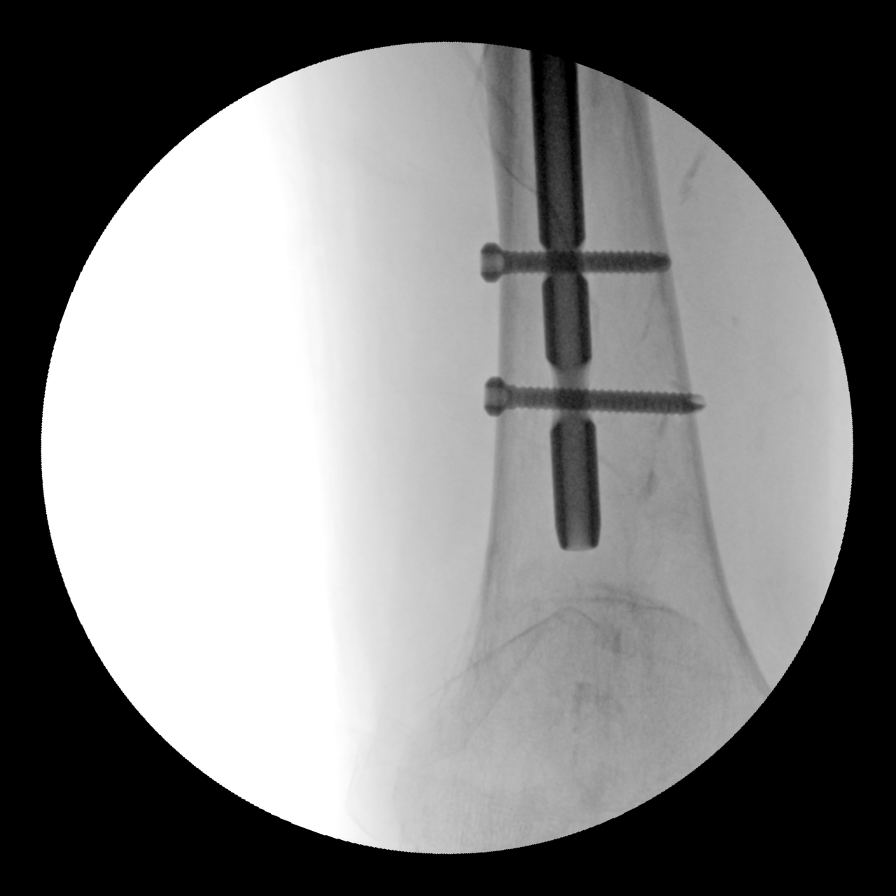
[im 7/7]
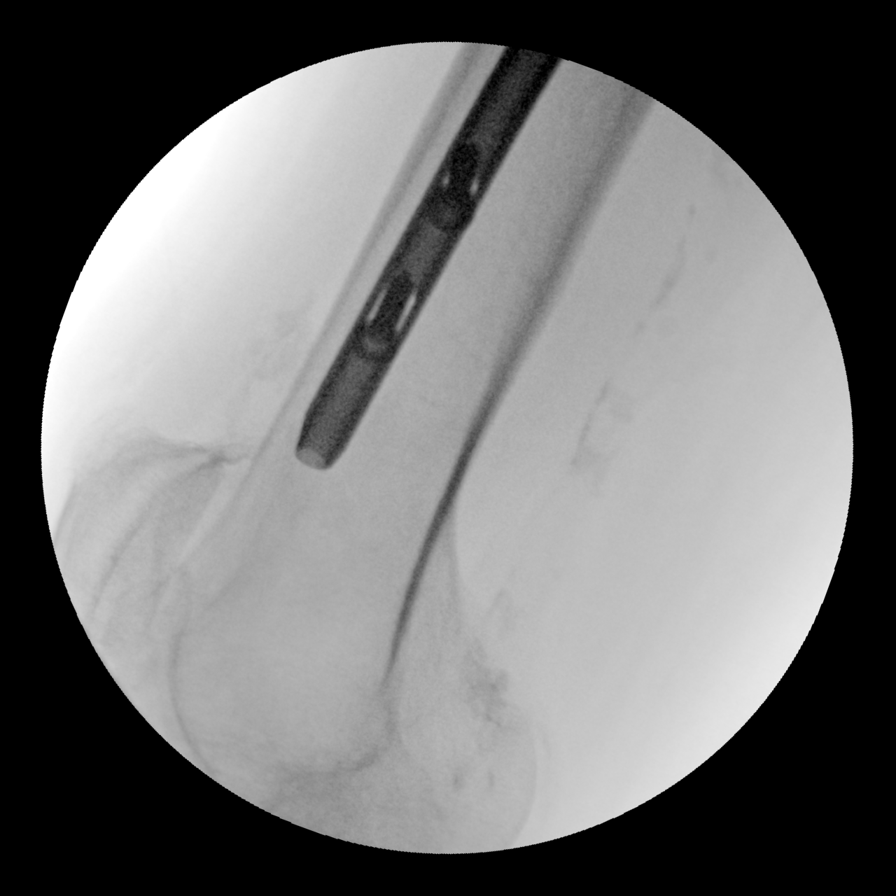

[7 of 7 positions shown; findings below may reference images not displayed]

FINDINGS: Post intramedullary nail placement with dynamic hip screw. No
unexpected immediate postprocedural complications on limited
assessment. Rod extends to the distal femur. Distal interlocking
screws are also in place. Soft tissue gas is noted about the
surgical site.

FLUOROSCOPY TIME:  2 minutes 6 seconds

Fluoroscopy dose: 13.86 mGy
IMPRESSION: Status post ORIF of the right hip. No unexpected postprocedural
complications.

## 2023-06-25 IMAGING — DX DG CHEST 1V PORT
1 series · 1 of 1 positions shown · non-contrast
Comparison: Chest x-ray dated December 18, 2021

CLINICAL DATA: Cough

EXAM:
PORTABLE CHEST 1 VIEW

[chest ap]
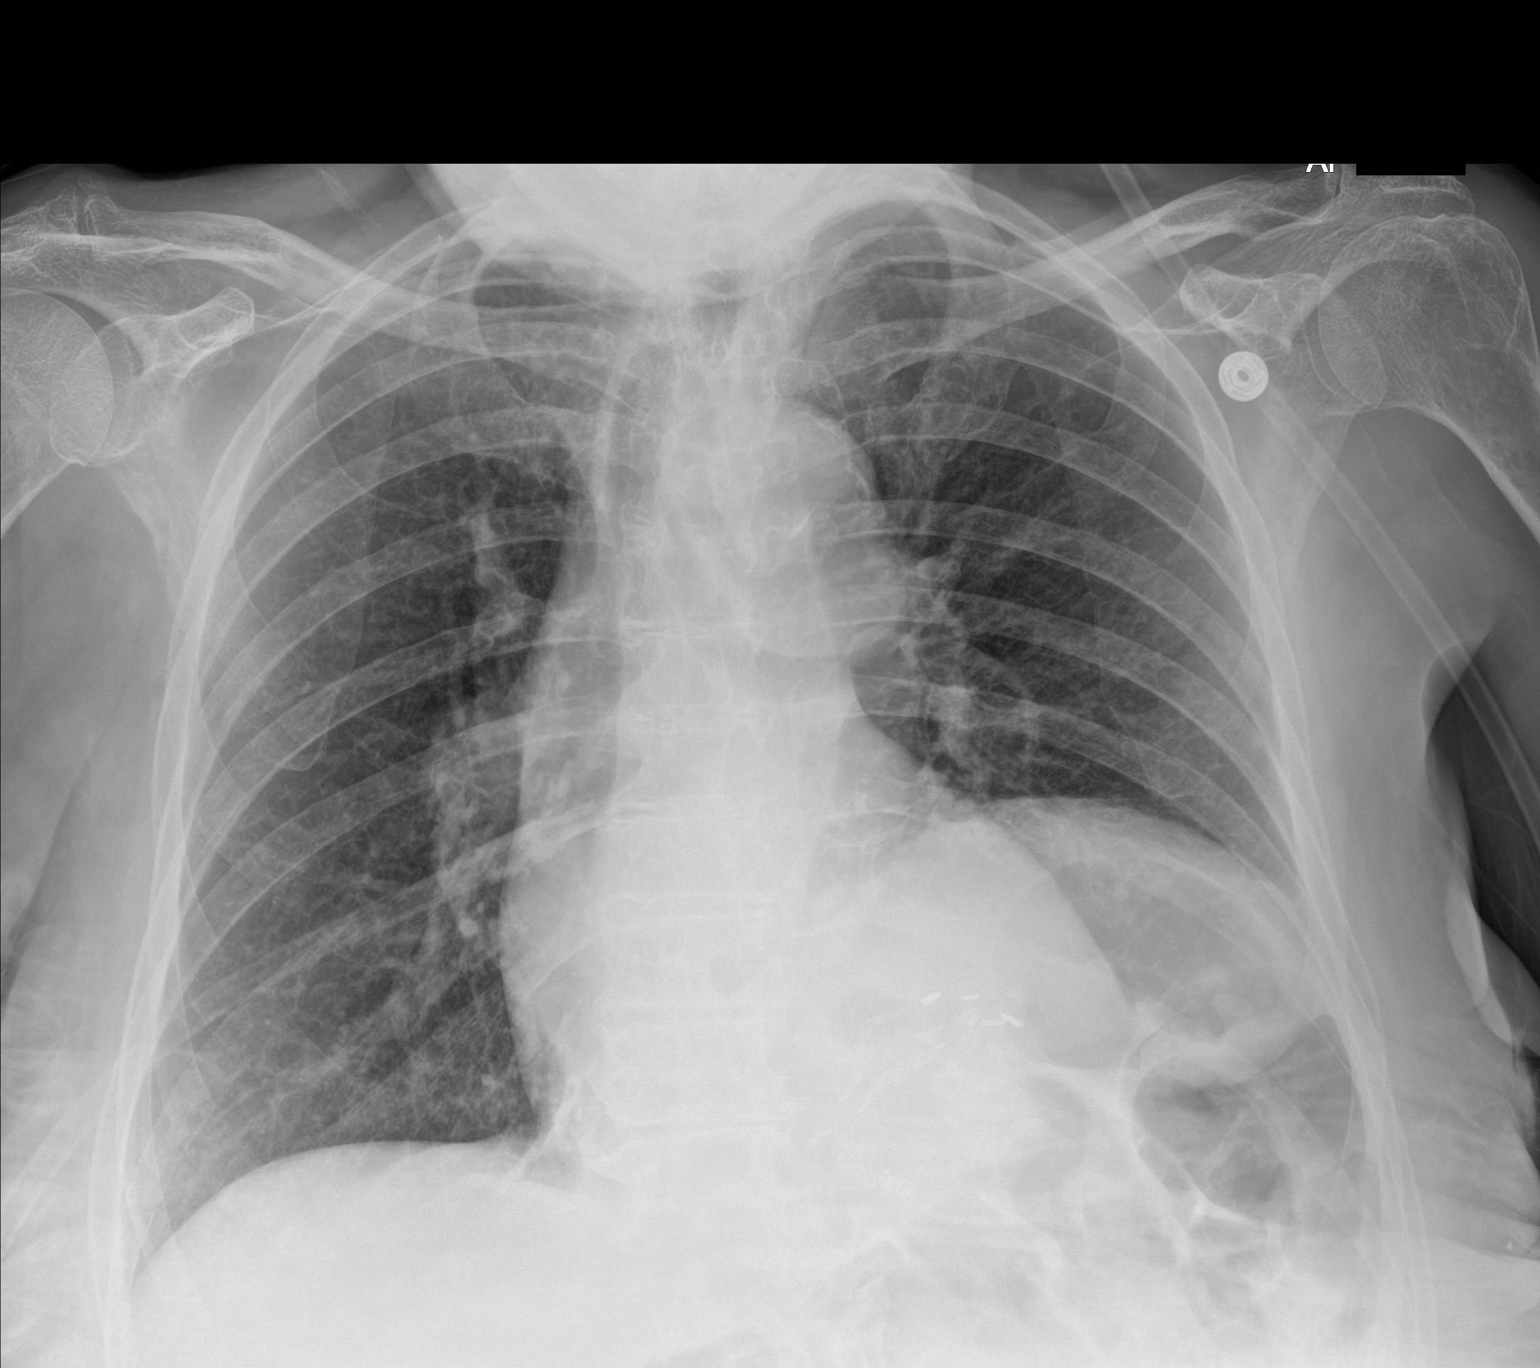

[1 of 1 positions shown; findings below may reference images not displayed]

FINDINGS: Cardiac and mediastinal contours are unchanged. Elevation of the
left hemidiaphragm. Mild bibasilar opacities which likely represent
atelectasis. Lungs otherwise clear. No large pleural effusion or
pneumothorax.
IMPRESSION: No active disease.
